# Patient Record
Sex: Male | Born: 1935 | Race: White | Hispanic: No | Marital: Married | Smoking: Former smoker
Health system: Southern US, Community
[De-identification: ages and names within clinical notes are randomized; demographics above are authoritative.]

## PROBLEM LIST (undated history)

## (undated) DIAGNOSIS — S065XAA Traumatic subdural hemorrhage with loss of consciousness status unknown, initial encounter: Secondary | ICD-10-CM

## (undated) DIAGNOSIS — I495 Sick sinus syndrome: Secondary | ICD-10-CM

## (undated) DIAGNOSIS — N400 Enlarged prostate without lower urinary tract symptoms: Secondary | ICD-10-CM

## (undated) DIAGNOSIS — J189 Pneumonia, unspecified organism: Secondary | ICD-10-CM

## (undated) DIAGNOSIS — S065X9A Traumatic subdural hemorrhage with loss of consciousness of unspecified duration, initial encounter: Secondary | ICD-10-CM

## (undated) DIAGNOSIS — Z950 Presence of cardiac pacemaker: Secondary | ICD-10-CM

## (undated) DIAGNOSIS — F0390 Unspecified dementia without behavioral disturbance: Secondary | ICD-10-CM

## (undated) HISTORY — PX: KNEE ARTHROSCOPY: SUR90

## (undated) HISTORY — PX: BURR HOLE: SHX908

## (undated) HISTORY — DX: Pneumonia, unspecified organism: J18.9

## (undated) HISTORY — DX: Sick sinus syndrome: I49.5

---

## 2006-04-12 ENCOUNTER — Inpatient Hospital Stay (HOSPITAL_COMMUNITY): Admission: RE | Admit: 2006-04-12 | Discharge: 2006-04-16 | Payer: Self-pay | Admitting: Neurosurgery

## 2006-04-18 ENCOUNTER — Encounter: Admission: RE | Admit: 2006-04-18 | Discharge: 2006-04-18 | Payer: Self-pay | Admitting: Neurosurgery

## 2006-04-25 ENCOUNTER — Encounter: Admission: RE | Admit: 2006-04-25 | Discharge: 2006-04-25 | Payer: Self-pay | Admitting: Neurosurgery

## 2006-05-17 ENCOUNTER — Encounter: Admission: RE | Admit: 2006-05-17 | Discharge: 2006-05-17 | Payer: Self-pay | Admitting: Neurosurgery

## 2012-01-01 ENCOUNTER — Emergency Department (HOSPITAL_COMMUNITY)
Admission: EM | Admit: 2012-01-01 | Discharge: 2012-01-01 | Disposition: A | Discharge status: Skilled Nursing Facility | Payer: Medicare Other | Attending: Emergency Medicine | Admitting: Emergency Medicine

## 2012-01-01 ENCOUNTER — Emergency Department (HOSPITAL_COMMUNITY): Payer: Medicare Other

## 2012-01-01 ENCOUNTER — Encounter (HOSPITAL_COMMUNITY): Payer: Self-pay | Admitting: Emergency Medicine

## 2012-01-01 DIAGNOSIS — W19XXXA Unspecified fall, initial encounter: Secondary | ICD-10-CM

## 2012-01-01 DIAGNOSIS — Y939 Activity, unspecified: Secondary | ICD-10-CM | POA: Insufficient documentation

## 2012-01-01 DIAGNOSIS — Z950 Presence of cardiac pacemaker: Secondary | ICD-10-CM | POA: Insufficient documentation

## 2012-01-01 DIAGNOSIS — N400 Enlarged prostate without lower urinary tract symptoms: Secondary | ICD-10-CM | POA: Insufficient documentation

## 2012-01-01 DIAGNOSIS — F0390 Unspecified dementia without behavioral disturbance: Secondary | ICD-10-CM | POA: Insufficient documentation

## 2012-01-01 DIAGNOSIS — S8990XA Unspecified injury of unspecified lower leg, initial encounter: Secondary | ICD-10-CM | POA: Insufficient documentation

## 2012-01-01 DIAGNOSIS — W010XXA Fall on same level from slipping, tripping and stumbling without subsequent striking against object, initial encounter: Secondary | ICD-10-CM | POA: Insufficient documentation

## 2012-01-01 DIAGNOSIS — Y929 Unspecified place or not applicable: Secondary | ICD-10-CM | POA: Insufficient documentation

## 2012-01-01 DIAGNOSIS — Z79899 Other long term (current) drug therapy: Secondary | ICD-10-CM | POA: Insufficient documentation

## 2012-01-01 HISTORY — DX: Unspecified dementia, unspecified severity, without behavioral disturbance, psychotic disturbance, mood disturbance, and anxiety: F03.90

## 2012-01-01 NOTE — ED Notes (Signed)
WUJ:WJ19<JY> Expected date:<BR> Expected time:<BR> Means of arrival:Ambulance<BR> Comments:<BR> fall

## 2012-01-01 NOTE — ED Notes (Signed)
Pt ambulated with steady gait to restroom #$ and back to pt's room.

## 2012-01-01 NOTE — ED Notes (Signed)
Per EMS pt came from nursing home. Pt was walking down carpeted hallway and thinks he got his feet tangled up under him and had un-witnessed fall. Per EMS pt was c/o of left leg pain. Pt c/o to me of left palm pain from catching himself and no other real pain at this time. Pt does have PMH of dementia/alzhiemers.

## 2012-01-01 NOTE — ED Notes (Addendum)
Pt's son requesting father be assessed for itching on posterior head, back and left rib cage area. And pt c/o of decreased movement in right ring finger. Dr. Clarene Duke made aware.

## 2012-01-01 NOTE — ED Notes (Signed)
Call son when pt is discharged:  Kylian Loh:  Work [phone]                            Cell [phone]

## 2012-01-01 NOTE — ED Provider Notes (Signed)
History     CSN: [id number]  Arrival date & time 01/01/12  4098   First MD Initiated Contact with Patient 01/01/12 [phone]      Chief Complaint  Patient presents with  . Leg Pain  . Fall     Patient is a 78 y.o. male presenting with leg pain and fall. The history is provided by the nursing home and the EMS personnel. History Limited By: Hx dementia.  Leg Pain   Fall  Pt was seen at 0820.  Per EMS, NH report and pt, pt with one episode of slip and fall PTA.  Pt states he "got my legs twisted up" and fell to his left side. Pt states he landed on his left shoulder, hand, hip, and knee. Also states he hit the left side of his head on the floor. Pt denies syncope and no reported syncope by NH staff.  Denies CP/SOB, no abd pain, no neck or back pain, no focal motor weakness, no tingling/numbness in extremities.     Past Medical History  Diagnosis Date  . Dementia     History reviewed. No pertinent past surgical history.   History  Substance Use Topics  . Smoking status: Never Smoker   . Smokeless tobacco: Not on file  . Alcohol Use: No      Review of Systems  Unable to perform ROS: Dementia    Allergies  Review of patient's allergies indicates no known allergies.  Home Medications   Current Outpatient Rx  Name Route Sig Dispense Refill  . FINASTERIDE 5 MG PO TABS Oral Take 5 mg by mouth daily.    Marland Kitchen LANSOPRAZOLE 30 MG PO CPDR Oral Take 30 mg by mouth daily.    Marland Kitchen MEMANTINE HCL 10 MG PO TABS Oral Take 10 mg by mouth 2 (two) times daily.    Marland Kitchen RIVASTIGMINE 9.5 MG/24HR TD PT24 Transdermal Place 1 patch onto the skin daily.    . SERTRALINE HCL 50 MG PO TABS Oral Take 50 mg by mouth daily.    Marland Kitchen SIMVASTATIN 40 MG PO TABS Oral Take 40 mg by mouth every evening.    Marland Kitchen TAMSULOSIN HCL 0.4 MG PO CAPS Oral Take 0.4 mg by mouth.    Marland Kitchen VITAMIN D (ERGOCALCIFEROL) 50000 UNITS PO CAPS Oral Take 50,000 Units by mouth every 7 (seven) days.      BP 122/72  Pulse 60  Temp 98.3 F (36.8  C) (Oral)  Resp 18  SpO2 96%  Physical Exam 0825: Physical examination: Vital signs and O2 SAT: Reviewed; Constitutional: Well developed, Well nourished, Well hydrated, In no acute distress; Head and Face: Normocephalic, Atraumatic. No scalp hematomas, contusions, abrasions.; Eyes: EOMI, PERRL, No scleral icterus; ENMT: Mouth and pharynx normal, Mucous membranes moist; Neck: Supple, Trachea midline; Spine: No midline CS, TS, LS tenderness.; Cardiovascular: Regular rate and rhythm, No gallop; Respiratory: Breath sounds clear & equal bilaterally, No wheezes, Normal respiratory effort/excursion; Chest: Nontender, No deformity, Movement normal, No crepitus, No abrasions or ecchymosis.; Abdomen: Soft, Nontender, Nondistended, Normal bowel sounds, No abrasions or ecchymosis.;; Extremities: No deformity, Full range of motion major/large joints of bilat UE's and LE's without pain or tenderness to palp, Neurovascularly intact, Pulses normal, No tenderness, No edema, No ecchymosis. Pelvis stable; Neuro: Awake, alert, mildly confused re: events, Major CN grossly intact. Speech clear. Moves all extremities well without apparent gross focal motor deficits.; Skin: Color normal, Warm, Dry  ED Course  Procedures   MDM  MDM Reviewed: nursing note, vitals  and previous chart Interpretation: x-ray and CT scan     Dg Hip Complete Left 01/01/2012  *RADIOLOGY REPORT*  Clinical Data: Fall  LEFT HIP - COMPLETE 2+ VIEW  Comparison: None.  Findings: Four views of the left hip submitted.  No acute fracture or subluxation.  No radiopaque foreign body.  Pelvic phleboliths are noted.  Mild generative changes lower lumbar spine.  IMPRESSION: No acute fracture or subluxation.   Original Report Authenticated By: Natasha Mead, M.D.    Ct Head Wo Contrast 01/01/2012  *RADIOLOGY REPORT*  Clinical Data: Fall  CT HEAD WITHOUT CONTRAST  Technique:  Contiguous axial images were obtained from the base of the skull through the vertex  without contrast.  Comparison: 05/17/2006  Findings: No skull fracture is noted.  Again noted burr holes in the left frontal and parietal skull.  No intracranial hemorrhage, mass effect or midline shift.  Stable mild cerebral atrophy.  Mild periventricular white matter decreased attenuation probable due to chronic small vessel ischemic changes.  No acute infarction.  No mass lesion is noted on this unenhanced scan.  IMPRESSION: No acute intracranial abnormality.  Stable atrophy.  Mild periventricular white matter decreased attenuation probable due to chronic small vessel ischemic changes.   Original Report Authenticated By: Natasha Mead, M.D.    Dg Shoulder Left 01/01/2012  *RADIOLOGY REPORT*  Clinical Data: Fall, shoulder pain  LEFT SHOULDER - 2+ VIEW  Comparison: None  Findings: Three views of the left shoulder submitted.  No acute fracture or subluxation.  There is high-riding humeral head suspicious for chronic rotator cuff insufficiency. Mild degenerative changes left AC joint.  IMPRESSION: No acute fracture or subluxation.  High riding humeral head suspicious for chronic rotator cuff insufficiency.   Original Report Authenticated By: Natasha Mead, M.D.    Dg Knee Complete 4 Views Left 01/01/2012  *RADIOLOGY REPORT*  Clinical Data: Fall, knee pain  LEFT KNEE - COMPLETE 4+ VIEW  Comparison: None.  Findings: Four views of the left knee submitted.  No acute fracture or subluxation.  Well corticated bony fragment adjacent to lateral tibial plateau is most likely due to prior injury.  Mild narrowing of medial joint compartment.  Mild spurring of medial tibial plateau.  Minimal spurring of the medial femoral condyle.  Mild narrowing of patellofemoral joint space.  No joint effusion.  IMPRESSION: No acute fracture or subluxation.  Mild degenerative changes as described above.   Original Report Authenticated By: Natasha Mead, M.D.    Dg Hand Complete Left 01/01/2012  *RADIOLOGY REPORT*  Clinical Data: History of injury  from fall.  History of pain.  LEFT HAND - COMPLETE 3+ VIEW  Comparison: None.  Findings: No acute fracture is evident.  No dislocation is seen. 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  IMPRESSION: No evidence of acute fracture.  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   Original Report Authenticated By: Crawford Givens, M.D.    Dg Finger Ring Right 01/01/2012  *RADIOLOGY REPORT*  Clinical Data: Pain  RIGHT RING FINGER 2+V  Comparison: None.  Findings: Three views of the right fourth finger submitted.  No acute fracture or subluxation.  No radiopaque foreign body.  IMPRESSION: No acute fracture or subluxation.   Original Report Authenticated By: Natasha Mead, M.D.  1245:  Pt has ambulated around the ED with steady gait, easy resps, NAD.  Pt and son would like to have pt go back to the NH now.  Dx and testing d/w pt and family.  Questions answered.  Verb understanding, agreeable to d/c home with outpt f/u.       Laray Anger, DO 01/02/12 1930

## 2012-01-02 ENCOUNTER — Encounter (HOSPITAL_COMMUNITY): Payer: Self-pay | Admitting: Emergency Medicine

## 2012-01-02 ENCOUNTER — Emergency Department (HOSPITAL_COMMUNITY)
Admission: EM | Admit: 2012-01-02 | Discharge: 2012-01-02 | Disposition: A | Discharge status: Home / Self Care | Payer: Medicare Other | Attending: Emergency Medicine | Admitting: Emergency Medicine

## 2012-01-02 DIAGNOSIS — R531 Weakness: Secondary | ICD-10-CM

## 2012-01-02 DIAGNOSIS — N400 Enlarged prostate without lower urinary tract symptoms: Secondary | ICD-10-CM | POA: Insufficient documentation

## 2012-01-02 DIAGNOSIS — F0390 Unspecified dementia without behavioral disturbance: Secondary | ICD-10-CM | POA: Insufficient documentation

## 2012-01-02 DIAGNOSIS — Z79899 Other long term (current) drug therapy: Secondary | ICD-10-CM | POA: Insufficient documentation

## 2012-01-02 DIAGNOSIS — R5381 Other malaise: Secondary | ICD-10-CM | POA: Insufficient documentation

## 2012-01-02 DIAGNOSIS — Z950 Presence of cardiac pacemaker: Secondary | ICD-10-CM | POA: Insufficient documentation

## 2012-01-02 HISTORY — DX: Benign prostatic hyperplasia without lower urinary tract symptoms: N40.0

## 2012-01-02 HISTORY — DX: Traumatic subdural hemorrhage with loss of consciousness status unknown, initial encounter: S06.5XAA

## 2012-01-02 HISTORY — DX: Presence of cardiac pacemaker: Z95.0

## 2012-01-02 HISTORY — DX: Traumatic subdural hemorrhage with loss of consciousness of unspecified duration, initial encounter: S06.5X9A

## 2012-01-02 NOTE — Progress Notes (Signed)
Prior to pt leaving [facility] ED CM confirmed with pt grand daughter that Charles Lambert is pcp EPIC updated

## 2012-01-02 NOTE — ED Provider Notes (Signed)
History     CSN: [id number]  Arrival date & time 01/02/12  1151   First MD Initiated Contact with Patient 01/02/12 1337      Chief Complaint  Patient presents with  . Hypotension    (Consider location/radiation/quality/duration/timing/severity/associated sxs/prior treatment) HPI Comments: Charles Lambert presents for evaluation from a local nursing home.  He has a history of dementia and was seen in the ER yesterday after falling while walking.  This event was described as a mechanical fall.  Today while preparing for physical rehab, he was noted to have a low blood pressure with a systolic in the 70s.  A second provider obtained his BP and also noted it to be laow.  There was some concern that his mental status was changed from his baseline also.  There has been no reported fever, decreased appetite, trauma today, NVD, SOB, CP, rashes, or there concerns.  Charles Lambert states he feels fine.  The history is provided by the patient, the nursing home and a relative. The history is limited by the condition of the patient (he has advanced dementia).    Past Medical History  Diagnosis Date  . Dementia   . Subdural hematoma   . Prostate enlargement   . Pacemaker     Past Surgical History  Procedure Date  . Burr hole     No family history on file.  History  Substance Use Topics  . Smoking status: Never Smoker   . Smokeless tobacco: Not on file  . Alcohol Use: No      Review of Systems  Unable to perform ROS   Allergies  Review of patient's allergies indicates no known allergies.  Home Medications   Current Outpatient Rx  Name Route Sig Dispense Refill  . FINASTERIDE 5 MG PO TABS Oral Take 5 mg by mouth daily.    Marland Kitchen LANSOPRAZOLE 30 MG PO CPDR Oral Take 30 mg by mouth daily.    Marland Kitchen MEMANTINE HCL 10 MG PO TABS Oral Take 10 mg by mouth 2 (two) times daily.    Marland Kitchen RIVASTIGMINE 9.5 MG/24HR TD PT24 Transdermal Place 1 patch onto the skin daily.    . SERTRALINE HCL 50 MG PO TABS  Oral Take 50 mg by mouth daily.    Marland Kitchen SIMVASTATIN 40 MG PO TABS Oral Take 40 mg by mouth every evening.    Marland Kitchen TAMSULOSIN HCL 0.4 MG PO CAPS Oral Take 0.4 mg by mouth.    Marland Kitchen VITAMIN D (ERGOCALCIFEROL) 50000 UNITS PO CAPS Oral Take 50,000 Units by mouth every 7 (seven) days.      BP 126/71  Pulse 60  Temp 98 F (36.7 C) (Oral)  Resp 20  Ht 5\' 10"  (1.778 m)  Wt 195 lb (88.451 kg)  BMI 27.98 kg/m2  SpO2 96%  Physical Exam  Nursing note and vitals reviewed. Constitutional: He appears well-developed and well-nourished. No distress.       Pt is sleep, easily aroused and pleasant.  HENT:  Head: Normocephalic and atraumatic.  Right Ear: External ear normal.  Left Ear: External ear normal.  Nose: Nose normal.  Mouth/Throat: Oropharynx is clear and moist. No oropharyngeal exudate.  Eyes: Conjunctivae normal and EOM are normal. Pupils are equal, round, and reactive to light. Right eye exhibits no discharge. Left eye exhibits no discharge. No scleral icterus.  Neck: Normal range of motion. Neck supple. No JVD present. No tracheal deviation present.  Cardiovascular: Normal rate, regular rhythm and intact distal pulses.  Exam reveals  no gallop and no friction rub.   Murmur heard. Pulmonary/Chest: Breath sounds normal. No stridor. No respiratory distress. He has no wheezes. He has no rales. He exhibits no tenderness.  Abdominal: Soft. Bowel sounds are normal. He exhibits no distension and no mass. There is no tenderness. There is no rebound and no guarding.  Musculoskeletal: Normal range of motion. He exhibits edema (trace pretibial). He exhibits no tenderness.  Lymphadenopathy:    He has no cervical adenopathy.  Neurological: He is alert. No cranial nerve deficit.       Oriented to his name, the medical setting, and his bedside granddaughter.  Unable to explain why he is in the ER   Skin: Skin is warm and dry. No rash noted. He is not diaphoretic. No erythema. No pallor.  Psychiatric: He has a  normal mood and affect. His behavior is normal.    ED Course  Procedures (including critical care time)  Labs Reviewed - No data to display Dg Hip Complete Left  01/01/2012  *RADIOLOGY REPORT*  Clinical Data: Fall  LEFT HIP - COMPLETE 2+ VIEW  Comparison: None.  Findings: Four views of the left hip submitted.  No acute fracture or subluxation.  No radiopaque foreign body.  Pelvic phleboliths are noted.  Mild generative changes lower lumbar spine.  IMPRESSION: No acute fracture or subluxation.   Original Report Authenticated By: Natasha Mead, M.D.    Ct Head Wo Contrast  01/01/2012  *RADIOLOGY REPORT*  Clinical Data: Fall  CT HEAD WITHOUT CONTRAST  Technique:  Contiguous axial images were obtained from the base of the skull through the vertex without contrast.  Comparison: 05/17/2006  Findings: No skull fracture is noted.  Again noted burr holes in the left frontal and parietal skull.  No intracranial hemorrhage, mass effect or midline shift.  Stable mild cerebral atrophy.  Mild periventricular white matter decreased attenuation probable due to chronic small vessel ischemic changes.  No acute infarction.  No mass lesion is noted on this unenhanced scan.  IMPRESSION: No acute intracranial abnormality.  Stable atrophy.  Mild periventricular white matter decreased attenuation probable due to chronic small vessel ischemic changes.   Original Report Authenticated By: Natasha Mead, M.D.    Dg Shoulder Left  01/01/2012  *RADIOLOGY REPORT*  Clinical Data: Fall, shoulder pain  LEFT SHOULDER - 2+ VIEW  Comparison: None  Findings: Three views of the left shoulder submitted.  No acute fracture or subluxation.  There is high-riding humeral head suspicious for chronic rotator cuff insufficiency. Mild degenerative changes left AC joint.  IMPRESSION: No acute fracture or subluxation.  High riding humeral head suspicious for chronic rotator cuff insufficiency.   Original Report Authenticated By: Natasha Mead, M.D.    Dg  Knee Complete 4 Views Left  01/01/2012  *RADIOLOGY REPORT*  Clinical Data: Fall, knee pain  LEFT KNEE - COMPLETE 4+ VIEW  Comparison: None.  Findings: Four views of the left knee submitted.  No acute fracture or subluxation.  Well corticated bony fragment adjacent to lateral tibial plateau is most likely due to prior injury.  Mild narrowing of medial joint compartment.  Mild spurring of medial tibial plateau.  Minimal spurring of the medial femoral condyle.  Mild narrowing of patellofemoral joint space.  No joint effusion.  IMPRESSION: No acute fracture or subluxation.  Mild degenerative changes as described above.   Original Report Authenticated By: Natasha Mead, M.D.    Dg Hand Complete Left  01/01/2012  *RADIOLOGY REPORT*  Clinical Data: History of  injury from fall.  History of pain.  LEFT HAND - COMPLETE 3+ VIEW  Comparison: None.  Findings: No acute fracture is evident.  No dislocation is seen. 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  IMPRESSION: No evidence of acute fracture.  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   Original Report Authenticated By: Crawford Givens, M.D.    Dg Finger Ring Right  01/01/2012  *RADIOLOGY REPORT*  Clinical Data: Pain  RIGHT RING FINGER 2+V  Comparison: None.  Findings: Three views of the right fourth finger submitted.  No acute fracture or subluxation.  No radiopaque foreign body.  IMPRESSION: No acute fracture or subluxation.   Original Report Authenticated By: Natasha Mead, M.D.      No diagnosis found.   Date: 01/02/2012  Rate: 60 bpm  Rhythm: junctional rhythm  QRS Axis: normal  Intervals: normal (absent PR int)  ST/T Wave abnormalities: nonspecific ST changes  Conduction  Disutrbances:none  Narrative Interpretation:   Old EKG Reviewed: last study was sinus brady      MDM  Pt presents for evaluation of hypotension.  He has a hx of dementia and was seen in the ER yesterday after a mechanical fall.  His granddaughter is at the bedside and states his mental status is at his baseline.  He appears comfortable, NAD.  Note no hypotension on his initial vital signs.  Will repeat vital signs and obtain orthostatic vital signs also.  Will check a rect al temperature and challenge him with a lunch tray.  If abnormalities are noted, will broaden his evaluation.  1620.  Pt stable, NAD.  He is awake and oriented at his baseline.  Not negative orthostatic vital signs and no neurologic instability.  He has been observed for 4.5 hours here in the ER.  Discussed his evaluation with Chrissy the practiciooner from his nursing facility.  Plan discharge home.      Tobin Chad, MD 01/02/12 [phone]

## 2012-01-02 NOTE — ED Notes (Signed)
Pt from [facility] was seen here yesterday for a fall, and now brought in by the request of the resident PA who wanted him evaluated for a low blood pressure this AM which she felt may have contributed to his fall yesterday.

## 2012-01-10 ENCOUNTER — Encounter: Payer: Self-pay | Admitting: *Deleted

## 2012-01-10 ENCOUNTER — Encounter: Payer: Self-pay | Admitting: Internal Medicine

## 2012-01-10 ENCOUNTER — Ambulatory Visit (INDEPENDENT_AMBULATORY_CARE_PROVIDER_SITE_OTHER): Payer: Medicare Other | Admitting: Internal Medicine

## 2012-01-10 VITALS — BP 126/64 | HR 61 | Ht 71.0 in | Wt 207.0 lb

## 2012-01-10 DIAGNOSIS — Z950 Presence of cardiac pacemaker: Secondary | ICD-10-CM

## 2012-01-10 DIAGNOSIS — I495 Sick sinus syndrome: Secondary | ICD-10-CM

## 2012-01-10 DIAGNOSIS — I4891 Unspecified atrial fibrillation: Secondary | ICD-10-CM

## 2012-01-10 LAB — PACEMAKER DEVICE OBSERVATION
AL AMPLITUDE: 1.3 mv
AL THRESHOLD: 1 V
ATRIAL PACING PM: 97
BATTERY VOLTAGE: 2.9629 V
DEVICE MODEL PM: 7135038
RV LEAD IMPEDENCE PM: 437.5 Ohm
RV LEAD THRESHOLD: 1 V

## 2012-01-10 NOTE — Progress Notes (Signed)
HPI [name] is referred today for ongoing evaluation and management of his PPM. The patient is originally from [place] and moved [facility] several years ago. He and his wife have subsequently moved to [place] to be closer to family. The patient has a history of dementia which is progressive. He also is a history of bradycardia and atrial fibrillation and tachybradycardia syndrome. He underwent permanent pacemaker insertion in 2011. He denies chest pain or shortness of breath. He struggles with problems with memory. He is been fairly sedentary. No Known Allergies   Current Outpatient Prescriptions  Medication Sig Dispense Refill  . finasteride (PROSCAR) 5 MG tablet Take 5 mg by mouth daily.      . lansoprazole (PREVACID) 30 MG capsule Take 30 mg by mouth daily.      . memantine (NAMENDA) 10 MG tablet Take 10 mg by mouth 2 (two) times daily.      . rivastigmine (EXELON) 9.5 mg/24hr Place 1 patch onto the skin daily.      . sertraline (ZOLOFT) 50 MG tablet Take 50 mg by mouth daily.      . simvastatin (ZOCOR) 40 MG tablet Take 40 mg by mouth every evening.      . Tamsulosin HCl (FLOMAX) 0.4 MG CAPS Take 0.4 mg by mouth.      . traMADol (ULTRAM) 50 MG tablet Take 50 mg by mouth every 6 (six) hours as needed.      . Vitamin D, Ergocalciferol, (DRISDOL) 50000 UNITS CAPS Take 50,000 Units by mouth every 7 (seven) days.         Past Medical History  Diagnosis Date  . Dementia   . Subdural hematoma   . Prostate enlargement   . Pacemaker   . Sinoatrial node dysfunction     ROS:   All systems reviewed and negative except as noted in the HPI.   Past Surgical History  Procedure Date  . Burr hole      No family history on file.   History   Social History  . Marital Status: Married    Spouse Name: N/A    Number of Children: N/A  . Years of Education: N/A   Occupational History  . Not on file.   Social History Main Topics  . Smoking status: Never Smoker     . Smokeless tobacco: Not on file  . Alcohol Use: No  . Drug Use: No  . Sexually Active:    Other Topics Concern  . Not on file   Social History Narrative  . No narrative on file     BP 126/64  Pulse 61  Ht 5\' 11"  (1.803 m)  Wt 207 lb (93.895 kg)  BMI 28.87 kg/m2  SpO2 97%  Physical Exam:  Well appearing elderly man, NAD HEENT: Unremarkable Neck:  No JVD, no thyromegally Lungs:  Clear with no wheezes, rales, or rhonchi. Well-healed pacemaker incision. HEART:  Regular rate rhythm, no murmurs, no rubs, no clicks Abd:  soft, positive bowel sounds, no organomegally, no rebound, no guarding Ext:  2 plus pulses, no edema, no cyanosis, no clubbing Skin:  No rashes no nodules Neuro:  CN II through XII intact, motor grossly intact   DEVICE  Normal device function.  See PaceArt for details.   Assess/Plan:

## 2012-01-10 NOTE — Assessment & Plan Note (Signed)
He underwent cardioversion in June. Since then he appears to be maintaining sinus rhythm very nicely. Because of his history of dementia and falls, he has not felt to be an anticoagulation candidate.

## 2012-01-10 NOTE — Patient Instructions (Addendum)
Your physician wants you to follow-up in: 6 months with Dr Court Joy will receive a reminder letter in the mail two months in advance. If you don't receive a letter, please call our office to schedule the follow-up appointment.   Remote monitoring is used to monitor your Pacemaker of ICD from home. This monitoring reduces the number of office visits required to check your device to one time per year. It allows [name] to keep an eye on the functioning of your device to ensure it is working properly. You are scheduled for a device check from home on 04/17/12. You may send your transmission at any time that day. If you have a wireless device, the transmission will be sent automatically. After your physician reviews your transmission, you will receive a postcard with your next transmission date.

## 2012-01-10 NOTE — Assessment & Plan Note (Signed)
His dual-chamber pacemaker is working normally. We'll plan to recheck in several months.

## 2012-04-14 ENCOUNTER — Encounter: Payer: Medicare Other | Admitting: *Deleted

## 2012-04-16 ENCOUNTER — Encounter: Payer: Self-pay | Admitting: *Deleted

## 2012-06-16 ENCOUNTER — Emergency Department (HOSPITAL_COMMUNITY): Payer: Medicare Other

## 2012-06-16 ENCOUNTER — Encounter (HOSPITAL_COMMUNITY): Payer: Self-pay | Admitting: *Deleted

## 2012-06-16 ENCOUNTER — Emergency Department (HOSPITAL_COMMUNITY)
Admission: EM | Admit: 2012-06-16 | Discharge: 2012-06-16 | Disposition: A | Discharge status: Home / Self Care | Payer: Medicare Other | Attending: Emergency Medicine | Admitting: Emergency Medicine

## 2012-06-16 ENCOUNTER — Other Ambulatory Visit: Payer: Self-pay

## 2012-06-16 DIAGNOSIS — Z8679 Personal history of other diseases of the circulatory system: Secondary | ICD-10-CM | POA: Insufficient documentation

## 2012-06-16 DIAGNOSIS — F0390 Unspecified dementia without behavioral disturbance: Secondary | ICD-10-CM | POA: Insufficient documentation

## 2012-06-16 DIAGNOSIS — Z79899 Other long term (current) drug therapy: Secondary | ICD-10-CM | POA: Insufficient documentation

## 2012-06-16 DIAGNOSIS — R4182 Altered mental status, unspecified: Secondary | ICD-10-CM | POA: Insufficient documentation

## 2012-06-16 DIAGNOSIS — Z950 Presence of cardiac pacemaker: Secondary | ICD-10-CM | POA: Insufficient documentation

## 2012-06-16 DIAGNOSIS — R5381 Other malaise: Secondary | ICD-10-CM | POA: Insufficient documentation

## 2012-06-16 LAB — COMPREHENSIVE METABOLIC PANEL
Albumin: 3.6 g/dL (ref 3.5–5.2)
Alkaline Phosphatase: 56 U/L (ref 39–117)
BUN: 16 mg/dL (ref 6–23)
Chloride: 105 mEq/L (ref 96–112)
Creatinine, Ser: 1.29 mg/dL (ref 0.50–1.35)
GFR calc Af Amer: 58 mL/min — ABNORMAL LOW (ref >90)
GFR calc non Af Amer: 50 mL/min — ABNORMAL LOW (ref >90)
Glucose, Bld: 97 mg/dL (ref 70–99)
Potassium: 4.1 mEq/L (ref 3.5–5.1)
Total Bilirubin: 0.6 mg/dL (ref 0.3–1.2)

## 2012-06-16 LAB — CBC WITH DIFFERENTIAL/PLATELET
Basophils Relative: 0 % (ref 0–1)
HCT: 41.2 % (ref 39.0–52.0)
Hemoglobin: 13.9 g/dL (ref 13.0–17.0)
Lymphs Abs: 1 10*3/uL (ref 0.7–4.0)
MCH: 30.7 pg (ref 26.0–34.0)
MCHC: 33.7 g/dL (ref 30.0–36.0)
Monocytes Absolute: 0.4 10*3/uL (ref 0.1–1.0)
Monocytes Relative: 7 % (ref 3–12)
Neutro Abs: 3.7 10*3/uL (ref 1.7–7.7)
RBC: 4.53 MIL/uL (ref 4.22–5.81)

## 2012-06-16 LAB — URINALYSIS, ROUTINE W REFLEX MICROSCOPIC
Glucose, UA: NEGATIVE mg/dL
Ketones, ur: NEGATIVE mg/dL
Nitrite: NEGATIVE
Protein, ur: NEGATIVE mg/dL
Urobilinogen, UA: 2 mg/dL — ABNORMAL HIGH (ref 0.0–1.0)

## 2012-06-16 LAB — URINE MICROSCOPIC-ADD ON

## 2012-06-16 NOTE — ED Provider Notes (Signed)
History     CSN: [id number]  Arrival date & time 06/16/12  1319   First MD Initiated Contact with Patient 06/16/12 1324      Chief Complaint  Patient presents with  . Altered Mental Status    (Consider location/radiation/quality/duration/timing/severity/associated sxs/prior treatment) Patient is a 79 y.o. male presenting with altered mental status. The history is provided by the nursing home and a relative. The history is limited by the condition of the patient (dementia).  Altered Mental Status  He was sent from nursing home because of altered mental status which is noted that in today. Family states that he had been weak and lethargic several days ago and urinalysis have been sent him for suspected urinary tract infection. Culture supposed to be available today. He had been more alert and more active yesterday. RN noted that he was completely unresponsive when she went in to evaluate him but several minutes later he was talking and responding normally. Patient is complaining of a vague headache. He does have a history of holes to evacuate subdural hematoma. Family is present now and states that he is pretty much at his baseline mental status although somewhat more subdued than normal.  Past Medical History  Diagnosis Date  . Dementia   . Subdural hematoma   . Prostate enlargement   . Pacemaker   . Sinoatrial node dysfunction     Past Surgical History  Procedure Laterality Date  . Burr hole      History reviewed. No pertinent family history.  History  Substance Use Topics  . Smoking status: Never Smoker   . Smokeless tobacco: Not on file  . Alcohol Use: No      Review of Systems  Unable to perform ROS: Dementia  Psychiatric/Behavioral: Positive for altered mental status.    Allergies  Review of patient's allergies indicates no known allergies.  Home Medications   Current Outpatient Rx  Name  Route  Sig  Dispense  Refill  . cholecalciferol (VITAMIN D) 1000  UNITS tablet   Oral   Take 1,000 Units by mouth daily.         . finasteride (PROSCAR) 5 MG tablet   Oral   Take 5 mg by mouth daily.         . lansoprazole (PREVACID) 30 MG capsule   Oral   Take 30 mg by mouth daily.         . memantine (NAMENDA) 10 MG tablet   Oral   Take 10 mg by mouth 2 (two) times daily.         Marland Kitchen nystatin cream (MYCOSTATIN)   Topical   Apply 1 application topically 2 (two) times daily.         Marland Kitchen omega-3 acid ethyl esters (LOVAZA) 1 G capsule   Oral   Take 1 g by mouth daily.         . rivastigmine (EXELON) 9.5 mg/24hr   Transdermal   Place 1 patch onto the skin daily.         . sertraline (ZOLOFT) 50 MG tablet   Oral   Take 50 mg by mouth daily.         . simvastatin (ZOCOR) 40 MG tablet   Oral   Take 40 mg by mouth every evening.         . Tamsulosin HCl (FLOMAX) 0.4 MG CAPS   Oral   Take 0.4 mg by mouth daily after supper.          Marland Kitchen  traMADol (ULTRAM) 50 MG tablet   Oral   Take 50 mg by mouth every 8 (eight) hours as needed for pain.          . traZODone (DESYREL) 50 MG tablet   Oral   Take 50 mg by mouth at bedtime.         . vitamin B-12 (CYANOCOBALAMIN) 1000 MCG tablet   Oral   Take 1,000 mcg by mouth daily.           BP 150/103  Pulse 113  Temp(Src) 98.2 F (36.8 C) (Oral)  Resp 18  SpO2 96%  Physical Exam  Nursing note and vitals reviewed.  79 year old male, resting comfortably and in no acute distress. Vital signs are significant for tachycardia with heart rate of 113, and hypertension with blood pressure 150/103. Oxygen saturation is 96%, which is normal. Head is normocephalic with bur holes palpable. PERRLA, EOMI. Oropharynx is clear. Fundi show no hemorrhage, exudate, or papilledema. Neck is nontender and supple without adenopathy or JVD. Back is nontender and there is no CVA tenderness. Lungs are clear without rales, wheezes, or rhonchi. Chest is nontender. Heart has regular rate and  rhythm without murmur. Abdomen is soft, flat, nontender without masses or hepatosplenomegaly and peristalsis is normoactive. Extremities have trace edema, full range of motion is present. Skin is warm and dry without rash. Neurologic: He is awake, alert, oriented to person and place but not time. Speech is slightly slow but appropriate. Cranial nerves are grossly intact. Increased muscle and is noted with slight waxy rigidity. Clonus is present in lower extremities more prominent at the knees and the ankle. Ankle has 2 beat clonus and he has virtually continuous clonus.  ED Course  Procedures (including critical care time)  Results for orders placed during the hospital encounter of 06/16/12  CBC WITH DIFFERENTIAL      Result Value Range   WBC 5.2  4.0 - 10.5 K/uL   RBC 4.53  4.22 - 5.81 MIL/uL   Hemoglobin 13.9  13.0 - 17.0 g/dL   HCT 32.4  40.1 - 02.7 %   MCV 90.9  78.0 - 100.0 fL   MCH 30.7  26.0 - 34.0 pg   MCHC 33.7  30.0 - 36.0 g/dL   RDW 25.3  66.4 - 40.3 %   Platelets 195  150 - 400 K/uL   Neutrophils Relative 72  43 - 77 %   Neutro Abs 3.7  1.7 - 7.7 K/uL   Lymphocytes Relative 19  12 - 46 %   Lymphs Abs 1.0  0.7 - 4.0 K/uL   Monocytes Relative 7  3 - 12 %   Monocytes Absolute 0.4  0.1 - 1.0 K/uL   Eosinophils Relative 2  0 - 5 %   Eosinophils Absolute 0.1  0.0 - 0.7 K/uL   Basophils Relative 0  0 - 1 %   Basophils Absolute 0.0  0.0 - 0.1 K/uL  COMPREHENSIVE METABOLIC PANEL      Result Value Range   Sodium 142  135 - 145 mEq/L   Potassium 4.1  3.5 - 5.1 mEq/L   Chloride 105  96 - 112 mEq/L   CO2 29  19 - 32 mEq/L   Glucose, Bld 97  70 - 99 mg/dL   BUN 16  6 - 23 mg/dL   Creatinine, Ser 4.74  0.50 - 1.35 mg/dL   Calcium 9.4  8.4 - 25.9 mg/dL   Total Protein 6.6  6.0 -  8.3 g/dL   Albumin 3.6  3.5 - 5.2 g/dL   AST 22  0 - 37 U/L   ALT 13  0 - 53 U/L   Alkaline Phosphatase 56  39 - 117 U/L   Total Bilirubin 0.6  0.3 - 1.2 mg/dL   GFR calc non Af Amer 50 (*) >90  mL/min   GFR calc Af Amer 58 (*) >90 mL/min  URINALYSIS, ROUTINE W REFLEX MICROSCOPIC      Result Value Range   Color, Urine YELLOW  YELLOW   APPearance CLEAR  CLEAR   Specific Gravity, Urine 1.015  1.005 - 1.030   pH 7.0  5.0 - 8.0   Glucose, UA NEGATIVE  NEGATIVE mg/dL   Hgb urine dipstick NEGATIVE  NEGATIVE   Bilirubin Urine NEGATIVE  NEGATIVE   Ketones, ur NEGATIVE  NEGATIVE mg/dL   Protein, ur NEGATIVE  NEGATIVE mg/dL   Urobilinogen, UA 2.0 (*) 0.0 - 1.0 mg/dL   Nitrite NEGATIVE  NEGATIVE   Leukocytes, UA TRACE (*) NEGATIVE  URINE MICROSCOPIC-ADD ON      Result Value Range   Squamous Epithelial / LPF RARE  RARE   WBC, UA 0-2  <3 WBC/hpf   RBC / HPF 3-6  <3 RBC/hpf   Bacteria, UA RARE  RARE   Dg Chest 2 View  06/16/2012  *RADIOLOGY REPORT*  Clinical Data: Altered mental status.  Chest pain.  CHEST - 2 VIEW  Comparison: 04/12/2006  Findings: Patient has left-sided transvenous pacemaker, leads to the right atrium and right ventricle.  The heart is mildly enlarged.  There are perihilar bronchitic changes.  No focal consolidations or pleural effusions are identified.  Mild lower thoracic degenerative changes are identified.  Nodule previously questioned in the left upper lobe is not seen on current study but may be obscured by the pacemaker.  IMPRESSION:  1.  Cardiomegaly without pulmonary edema. 2.  Bronchitic changes. 3. No focal pulmonary abnormality.   Original Report Authenticated By: Norva Pavlov, M.D.    Ct Head Wo Contrast  06/16/2012  *RADIOLOGY REPORT*  Clinical Data: Altered mental status.  Nonverbal.  CT HEAD WITHOUT CONTRAST  Technique:  Contiguous axial images were obtained from the base of the skull through the vertex without contrast.  Comparison: 01/01/2012  Findings: Bone windows demonstrate prior left frontal craniotomy. Clear paranasal sinuses and mastoid air cells.  Soft tissue windows demonstrate mild cerebral and cerebellar atrophy. No  mass lesion, hemorrhage,  hydrocephalus, acute infarct, intra-axial, or extra-axial fluid collection.  Mild low density in the periventricular white matter likely related to small vessel disease.  IMPRESSION:  1. No acute intracranial abnormality. 2. Cerebral atrophy and small vessel ischemic change.   Original Report Authenticated By: Jeronimo Greaves, M.D.       Date: 06/16/2012  Rate: 114  Rhythm: sinus tachycardia  QRS Axis: left  Intervals: QT prolonged  ST/T Wave abnormalities: normal  Conduction Disutrbances:left bundle branch block  Narrative Interpretation: Sinus tachycardia with left bundle branch block and prolonged QT interval. Compared with ECG of 01/02/2012, heart rate is increased, left bundle branch block is now present.  Old EKG Reviewed: changes noted    1. Altered mental status       MDM  Altered mental status which is transient and resolving. Given his clonus, I'm suspicious that he is having some type of seizure causing his altered mentation. Workup has been initiated.  Records from his assisted living facility have been obtained and a urine culture from  2 days ago did not have any growth. Head CT and chest x-ray are unremarkable.  Workup in the ED is unremarkable. Have discussed possibility of a seizure with patient and the family. He has not had any further episodes of altered mentation in the ED so I do not feel he needs any immediate intervention. I recommended EEG and possible neurology consultation as an outpatient.  Dione Booze, MD 06/16/12 [phone]

## 2012-06-16 NOTE — ED Notes (Signed)
Pt from [facility].  Staff report that he became altered at noon today.  Pt not responding to commands or questions.  Pt non-verbal at this time.  Norm-pt talkative, friendly, ambulatory.  PERRLA.  140/90, 84, CBG 98.

## 2012-07-15 ENCOUNTER — Encounter: Payer: Self-pay | Admitting: Internal Medicine

## 2012-07-15 ENCOUNTER — Ambulatory Visit (INDEPENDENT_AMBULATORY_CARE_PROVIDER_SITE_OTHER): Payer: Medicare Other | Admitting: Internal Medicine

## 2012-07-15 VITALS — BP 142/108 | HR 114 | Ht 70.0 in | Wt 207.0 lb

## 2012-07-15 DIAGNOSIS — I495 Sick sinus syndrome: Secondary | ICD-10-CM

## 2012-07-15 DIAGNOSIS — Z950 Presence of cardiac pacemaker: Secondary | ICD-10-CM

## 2012-07-15 DIAGNOSIS — I4891 Unspecified atrial fibrillation: Secondary | ICD-10-CM

## 2012-07-15 NOTE — Assessment & Plan Note (Signed)
His [employer] dual-chamber pacemaker is working normally. We'll plan to recheck in several months. 

## 2012-07-15 NOTE — Assessment & Plan Note (Signed)
He is out of rhythm today. He is been out of rhythm for several weeks. Despite this his symptoms have been quiet.

## 2012-07-15 NOTE — Patient Instructions (Addendum)
Your physician wants you to follow-up in: 1 year with Dr Ladona Ridgel. You will receive a reminder letter in the mail two months in advance. If you don't receive a letter, please call our office to schedule the follow-up appointment.  Patient needs to walk 5-10 minutes after each meal per Dr Ladona Ridgel.

## 2012-07-15 NOTE — Progress Notes (Signed)
HPI [name] returns today for followup. He is a very pleasant 79 year old man with a history of cigarette bradycardia, subdural hematoma, hypertension, status post permanent pacemaker insertion. He also has atrial fibrillation/flutter. He is not a candidate for anti-coagulation because of his remote history of bleeding. In the interim, he has been stable. He denies chest pain or shortness of breath. He is mild peripheral edema.  No Known Allergies   Current Outpatient Prescriptions  Medication Sig Dispense Refill  . cholecalciferol (VITAMIN D) 1000 UNITS tablet Take 1,000 Units by mouth daily.      . finasteride (PROSCAR) 5 MG tablet Take 5 mg by mouth daily.      . lansoprazole (PREVACID) 30 MG capsule Take 30 mg by mouth daily.      . memantine (NAMENDA) 10 MG tablet Take 10 mg by mouth 2 (two) times daily.      Marland Kitchen nystatin cream (MYCOSTATIN) Apply 1 application topically 2 (two) times daily.      Marland Kitchen omega-3 acid ethyl esters (LOVAZA) 1 G capsule Take 1 g by mouth daily.      . rivastigmine (EXELON) 9.5 mg/24hr Place 1 patch onto the skin daily.      . sertraline (ZOLOFT) 50 MG tablet Take 50 mg by mouth daily.      . simvastatin (ZOCOR) 40 MG tablet Take 40 mg by mouth every evening.      . Tamsulosin HCl (FLOMAX) 0.4 MG CAPS Take 0.4 mg by mouth daily after supper.       . traMADol (ULTRAM) 50 MG tablet Take 50 mg by mouth every 8 (eight) hours as needed for pain.       . traZODone (DESYREL) 50 MG tablet Take 50 mg by mouth at bedtime.      . vitamin B-12 (CYANOCOBALAMIN) 1000 MCG tablet Take 1,000 mcg by mouth daily.       No current facility-administered medications for this visit.     Past Medical History  Diagnosis Date  . Dementia   . Subdural hematoma   . Prostate enlargement   . Pacemaker   . Sinoatrial node dysfunction     ROS:   All systems reviewed and negative except as noted in the HPI.   Past Surgical History  Procedure Laterality Date  . Burr hole        No family history on file.   History   Social History  . Marital Status: Married    Spouse Name: N/A    Number of Children: N/A  . Years of Education: N/A   Occupational History  . Not on file.   Social History Main Topics  . Smoking status: Never Smoker   . Smokeless tobacco: Not on file  . Alcohol Use: No  . Drug Use: No  . Sexually Active:    Other Topics Concern  . Not on file   Social History Narrative  . No narrative on file     BP 142/108  Pulse 114  Ht 5\' 10"  (1.778 m)  Wt 207 lb (93.895 kg)  BMI 29.7 kg/m2  Physical Exam:  Chronically ill appearing 79 year old man,NAD HEENT: Unremarkable, except for a healed left frontal temporal scar. Neck:  7 cm JVD, no thyromegally Lungs:  Clear except for rales in the bases, right greater than left. HEART:  Regular rate rhythm, no murmurs, no rubs, no clicks Abd:  soft, positive bowel sounds, no organomegally, no rebound, no guarding Ext:  2 plus pulses, no edema, no  cyanosis, no clubbing Skin:  No rashes no nodules Neuro:  CN II through XII intact, motor grossly intact   DEVICE  Normal device function.  See PaceArt for details.   Assess/Plan:

## 2012-07-16 LAB — PACEMAKER DEVICE OBSERVATION
AL IMPEDENCE PM: 380 Ohm
ATRIAL PACING PM: 56
BAMS-0001: 180 {beats}/min
BAMS-0003: 80 {beats}/min
BATTERY VOLTAGE: 2.95 V
BRDY-0002RV: 60 {beats}/min
BRDY-0003RV: 120 {beats}/min
BRDY-0004RV: 120 {beats}/min
RV LEAD AMPLITUDE: 12 mv

## 2012-09-15 ENCOUNTER — Encounter: Payer: Self-pay | Admitting: Internal Medicine

## 2012-09-15 ENCOUNTER — Other Ambulatory Visit: Payer: Self-pay

## 2012-10-20 ENCOUNTER — Encounter: Payer: Medicare Other | Admitting: *Deleted

## 2012-10-22 ENCOUNTER — Encounter: Payer: Self-pay | Admitting: *Deleted

## 2012-12-22 ENCOUNTER — Inpatient Hospital Stay (HOSPITAL_COMMUNITY)
Admission: EM | Admit: 2012-12-22 | Discharge: 2012-12-30 | DRG: 097 | Disposition: A | Discharge status: Skilled Nursing Facility | Payer: Medicare Other | Attending: Family Medicine | Admitting: Family Medicine

## 2012-12-22 ENCOUNTER — Encounter (HOSPITAL_COMMUNITY): Payer: Self-pay | Admitting: Emergency Medicine

## 2012-12-22 ENCOUNTER — Emergency Department (HOSPITAL_COMMUNITY): Payer: Medicare Other

## 2012-12-22 DIAGNOSIS — N400 Enlarged prostate without lower urinary tract symptoms: Secondary | ICD-10-CM | POA: Diagnosis present

## 2012-12-22 DIAGNOSIS — F0280 Dementia in other diseases classified elsewhere without behavioral disturbance: Secondary | ICD-10-CM | POA: Diagnosis present

## 2012-12-22 DIAGNOSIS — R404 Transient alteration of awareness: Secondary | ICD-10-CM | POA: Diagnosis present

## 2012-12-22 DIAGNOSIS — E876 Hypokalemia: Secondary | ICD-10-CM

## 2012-12-22 DIAGNOSIS — F0390 Unspecified dementia without behavioral disturbance: Secondary | ICD-10-CM | POA: Diagnosis present

## 2012-12-22 DIAGNOSIS — E860 Dehydration: Secondary | ICD-10-CM | POA: Diagnosis present

## 2012-12-22 DIAGNOSIS — R400 Somnolence: Secondary | ICD-10-CM | POA: Diagnosis present

## 2012-12-22 DIAGNOSIS — G309 Alzheimer's disease, unspecified: Secondary | ICD-10-CM | POA: Diagnosis present

## 2012-12-22 DIAGNOSIS — N179 Acute kidney failure, unspecified: Secondary | ICD-10-CM | POA: Diagnosis present

## 2012-12-22 DIAGNOSIS — Z950 Presence of cardiac pacemaker: Secondary | ICD-10-CM

## 2012-12-22 DIAGNOSIS — R7881 Bacteremia: Secondary | ICD-10-CM | POA: Diagnosis present

## 2012-12-22 DIAGNOSIS — I495 Sick sinus syndrome: Secondary | ICD-10-CM

## 2012-12-22 DIAGNOSIS — G9340 Encephalopathy, unspecified: Secondary | ICD-10-CM | POA: Diagnosis present

## 2012-12-22 DIAGNOSIS — G039 Meningitis, unspecified: Principal | ICD-10-CM | POA: Diagnosis present

## 2012-12-22 DIAGNOSIS — R4182 Altered mental status, unspecified: Secondary | ICD-10-CM | POA: Diagnosis present

## 2012-12-22 DIAGNOSIS — T424X5A Adverse effect of benzodiazepines, initial encounter: Secondary | ICD-10-CM | POA: Diagnosis present

## 2012-12-22 DIAGNOSIS — D649 Anemia, unspecified: Secondary | ICD-10-CM | POA: Diagnosis present

## 2012-12-22 DIAGNOSIS — E785 Hyperlipidemia, unspecified: Secondary | ICD-10-CM | POA: Diagnosis present

## 2012-12-22 DIAGNOSIS — I4891 Unspecified atrial fibrillation: Secondary | ICD-10-CM

## 2012-12-22 DIAGNOSIS — R509 Fever, unspecified: Secondary | ICD-10-CM | POA: Diagnosis present

## 2012-12-22 LAB — URINALYSIS, ROUTINE W REFLEX MICROSCOPIC
Bilirubin Urine: NEGATIVE
Glucose, UA: NEGATIVE mg/dL
Ketones, ur: NEGATIVE mg/dL
Leukocytes, UA: NEGATIVE
pH: 5 (ref 5.0–8.0)

## 2012-12-22 LAB — CBC WITH DIFFERENTIAL/PLATELET
Eosinophils Relative: 1 % (ref 0–5)
HCT: 36.4 % — ABNORMAL LOW (ref 39.0–52.0)
Hemoglobin: 12.1 g/dL — ABNORMAL LOW (ref 13.0–17.0)
Lymphocytes Relative: 5 % — ABNORMAL LOW (ref 12–46)
MCV: 93.3 fL (ref 78.0–100.0)
Monocytes Absolute: 0.4 10*3/uL (ref 0.1–1.0)
Monocytes Relative: 5 % (ref 3–12)
Neutro Abs: 6.3 10*3/uL (ref 1.7–7.7)
WBC: 7.1 10*3/uL (ref 4.0–10.5)

## 2012-12-22 LAB — BASIC METABOLIC PANEL
BUN: 20 mg/dL (ref 6–23)
CO2: 25 mEq/L (ref 19–32)
Chloride: 106 mEq/L (ref 96–112)
Creatinine, Ser: 1.37 mg/dL — ABNORMAL HIGH (ref 0.50–1.35)
Glucose, Bld: 112 mg/dL — ABNORMAL HIGH (ref 70–99)

## 2012-12-22 LAB — URINE MICROSCOPIC-ADD ON

## 2012-12-22 LAB — CG4 I-STAT (LACTIC ACID): Lactic Acid, Venous: 1.36 mmol/L (ref 0.5–2.2)

## 2012-12-22 MED ORDER — SODIUM CHLORIDE 0.9 % IV BOLUS (SEPSIS)
500.0000 mL | Freq: Once | INTRAVENOUS | Status: AC
  Administered 2012-12-22: 500 mL via INTRAVENOUS

## 2012-12-22 MED ORDER — DEXTROSE 5 % IV SOLN
1.0000 g | Freq: Once | INTRAVENOUS | Status: AC
  Administered 2012-12-22: 1 g via INTRAVENOUS
  Filled 2012-12-22: qty 10

## 2012-12-22 MED ORDER — ACETAMINOPHEN 325 MG PO TABS: 650.0000 mg | ORAL_TABLET | Freq: Once | ORAL | Status: DC

## 2012-12-22 MED ORDER — ACETAMINOPHEN 500 MG PO TABS
1000.0000 mg | ORAL_TABLET | Freq: Once | ORAL | Status: AC
  Administered 2012-12-22: 1000 mg via ORAL
  Filled 2012-12-22: qty 2

## 2012-12-22 NOTE — ED Notes (Signed)
EMS called to [facility].  Found patient in on couch.  Staff states that patient is  Altered from his normal dementia state.  Patient also has a large bruise on his right shoulder that staff  Can not explain where it came from

## 2012-12-22 NOTE — ED Provider Notes (Signed)
CSN: [id number]     Arrival date & time 12/22/12  1914 History   First MD Initiated Contact with Patient 12/22/12 1922     Chief Complaint  Patient presents with  . Altered Mental Status    HPI  Patient presents from his extended care facility. He resides in an Alzheimer's unit. Noted to be more confused than his baseline today transferred here for evaluation. Generalized weakness.  Not eating today.  Noted to be febrile upon his arrival. No known falls or injury,  discoloration is noted to his back. He has a linear areas of erythema and early ecchymosis to his bilateral posterior ribs. Somewhat of a linear pattern. No reported use of a heating pad, no report of a fall,  no report of  coin rubbing.  The patient is a level V caveat due to dementia remainder of his history is not obtainable.   Past Medical History  Diagnosis Date  . Dementia   . Subdural hematoma   . Prostate enlargement   . Pacemaker   . Sinoatrial node dysfunction    Past Surgical History  Procedure Laterality Date  . Burr hole     History reviewed. No pertinent family history. History  Substance Use Topics  . Smoking status: Never Smoker   . Smokeless tobacco: Not on file  . Alcohol Use: No    Review of Systems  Unable to perform ROS: Dementia    Allergies  Review of patient's allergies indicates no known allergies.  Home Medications   Current Outpatient Rx  Name  Route  Sig  Dispense  Refill  . cholecalciferol (VITAMIN D) 1000 UNITS tablet   Oral   Take 1,000 Units by mouth daily.         . finasteride (PROSCAR) 5 MG tablet   Oral   Take 5 mg by mouth at bedtime.          . lansoprazole (PREVACID) 30 MG capsule   Oral   Take 30 mg by mouth daily.         . memantine (NAMENDA) 10 MG tablet   Oral   Take 10 mg by mouth 2 (two) times daily.         Marland Kitchen nystatin cream (MYCOSTATIN)   Topical   Apply 1 application topically 2 (two) times daily.         . rivastigmine (EXELON) 9.5  mg/24hr   Transdermal   Place 1 patch onto the skin daily.         . sertraline (ZOLOFT) 25 MG tablet   Oral   Take 25 mg by mouth daily.         . simvastatin (ZOCOR) 40 MG tablet   Oral   Take 40 mg by mouth every evening.         . Tamsulosin HCl (FLOMAX) 0.4 MG CAPS   Oral   Take 0.4 mg by mouth daily after supper.          . traMADol (ULTRAM) 50 MG tablet   Oral   Take 25 mg by mouth every 6 (six) hours as needed for pain. Take 1/2 tab every 6 hours as needed for pain         . traZODone (DESYREL) 50 MG tablet   Oral   Take 25 mg by mouth at bedtime. Take 1/2 tab at night as needed for insomnia         . vitamin B-12 (CYANOCOBALAMIN) 1000 MCG tablet   Oral  Take 1,000 mcg by mouth daily.          BP 143/56  Pulse 73  Temp(Src) 102.2 F (39 C) (Rectal)  Resp 18  SpO2 96% Physical Exam  Constitutional: He appears well-developed. No distress.  HENT:  Head: Atraumatic.  Eyes: Pupils are equal, round, and reactive to light.  Neck: Normal range of motion. Neck supple. No spinous process tenderness and no muscular tenderness present.  Cardiovascular: Normal rate and regular rhythm.   Pulmonary/Chest: He has no decreased breath sounds. He has no wheezes. He has no rhonchi. He has no rales.    Clear lungs  Abdominal: There is no tenderness.  Neurological: He is alert. He has normal strength. No cranial nerve deficit or sensory deficit. GCS eye subscore is 4. GCS verbal subscore is 5. GCS motor subscore is 6.  Alert.  Demented.  No focal or asymmetric weakness.  Generalized weakness.  Cannot sit independently.   Skin:  Linear areas of erythema or ecchymosis to the back    ED Course  Procedures (including critical care time) Labs Review Labs Reviewed  CBC WITH DIFFERENTIAL - Abnormal; Notable for the following:    RBC 3.90 (*)    Hemoglobin 12.1 (*)    HCT 36.4 (*)    Neutrophils Relative % 89 (*)    Lymphocytes Relative 5 (*)    Lymphs Abs 0.3  (*)    All other components within normal limits  BASIC METABOLIC PANEL - Abnormal; Notable for the following:    Glucose, Bld 112 (*)    Creatinine, Ser 1.37 (*)    GFR calc non Af Amer 46 (*)    GFR calc Af Amer 53 (*)    All other components within normal limits  URINALYSIS, ROUTINE W REFLEX MICROSCOPIC - Abnormal; Notable for the following:    Color, Urine AMBER (*)    Specific Gravity, Urine 1.033 (*)    Hgb urine dipstick TRACE (*)    All other components within normal limits  URINE MICROSCOPIC-ADD ON - Abnormal; Notable for the following:    Bacteria, UA FEW (*)    All other components within normal limits  CULTURE, BLOOD (ROUTINE X 2)  CULTURE, BLOOD (ROUTINE X 2)  URINE CULTURE  CG4 I-STAT (LACTIC ACID)   Imaging Review Ct Head Wo Contrast  12/22/2012   CLINICAL DATA:  Altered mental status. Fall. Head trauma. Prior subdural hematoma.  EXAM: CT HEAD WITHOUT CONTRAST  TECHNIQUE: Contiguous axial images were obtained from the base of the skull through the vertex without intravenous contrast.  COMPARISON:  06/16/2012  FINDINGS: There is no evidence of intracranial hemorrhage, brain edema, or other signs of acute infarction. There is no evidence of intracranial mass lesion or mass effect. No abnormal extraaxial fluid collections are identified.  Mild to moderate diffuse cerebral atrophy is stable as well as mild chronic small vessel disease. Ventricles are stable in size. No evidence of acute skull fracture. 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  IMPRESSION: No acute findings.  Stable cerebral atrophy and chronic small vessel disease.   Electronically Signed   By: Myles Rosenthal M.D.   On: 12/22/2012 21:34   Dg Chest Port 1 View  12/22/2012   CLINICAL DATA:  Altered mental status. Weakness.  EXAM: PORTABLE CHEST - 1 VIEW  COMPARISON:  06/16/2012  FINDINGS: Low lung volumes noted. Heart size within normal limits. Both lungs are clear. Dual lead transvenous pacemaker remains  in appropriate position.  IMPRESSION: No  acute findings.   Electronically Signed   By: Myles Rosenthal M.D.   On: 12/22/2012 20:17    EKG Interpretation   None       MDM   1. Fever   2. Acute renal failure   3. Anemia   4. Dementia    No obvious source for infection. Just a few cells in his urine. Blood and urine cultures. Given IV Rocephin. He remains markedly weak. This is a diffuse nonfocal weakness. No signs of stroke. Repeat CT is normal. His for consideration of his history of subdural. I discussed the case with hospitalist team. He'll be admitted under their care.    Roney Marion, MD 12/23/12 [phone]

## 2012-12-22 NOTE — ED Notes (Signed)
Bed: WA17 Expected date:  Expected time:  Means of arrival:  Comments: EMS, Alt LOC, Fever

## 2012-12-22 NOTE — Progress Notes (Signed)
CSW met at bedside with pt and family.  Pt did not respond to CSW, but his son Papa, [place].  [phone])  at his bedside and confirmed that pt is a resident of [facility] and that the plan is for pt to return once he is medically stable.  Pts son reports that he shares HCPOA  With his brother, Akshith Moncus [phone]).  Pt son thanked CSW for concern.  Marva Panda, LCSWA  [phone]  .12/22/2012  10:00 pm

## 2012-12-22 NOTE — H&P (Signed)
[facility] History and Physical  Charles Lambert WJX:[id number] DOB: 1927-09-27 DOA: 12/22/2012  Referring physician: ER physician PCP: [name], MD   Chief Complaint: altered mental status  HPI:  79 year old male with past medical history of alzheimer's dementia, from nursing home, BPH, dyslipidemia who presented to [facility] ED from SNF for worsening mental status changes for past 1 day prior to this admission. Patient is not a good historian due to dementia and family is not at the bedside at this time to provide the details of medical history. In ED, BP is 143/56, HR 73 and T 102.76F. WBC count was WNL. BMP revealed creatinine of 1.37. CT head did not reveal acute intracranial findings. UA and CXR unremarkable. Pt was given 1 dose of empiric vanco and zosyn in ED>  Assessment and Plan:  Principal Problem:   Fever - unclear etiology - UA negative, CXR negative for acute cardiopulmonary process - given rocephin in ED and vancomycin - follow up blood culture. If no further fever consider stopping antibiotics. WBC count WNL Active Problems:   Acute encephalopathy - likely worsening dementia - continue namenda - PT/OT evaluation   Anemia - hemoglobin 12.1 on admission - no indications for transfusion   Acute renal failure - secondary to dehydration - continue IV fluids - follow up renal function in am   Dementia - continue namenda   Radiological Exams on Admission: Ct Head Wo Contrast 12/22/2012   IMPRESSION: No acute findings.  Stable cerebral atrophy and chronic small vessel disease.   Electronically Signed   By: Myles Rosenthal M.D.   On: 12/22/2012 21:34   Dg Chest Port 1 View 12/22/2012    IMPRESSION: No acute findings.   Electronically Signed   By: Myles Rosenthal M.D.   On: 12/22/2012 20:17    Code Status: Full Family Communication: Pt at bedside Disposition Plan: Admit for further evaluation  [name], MD  [facility] Pager [phone]  Review of  Systems:  Unable to obtain due to dementia      Past Medical History  Diagnosis Date  . Dementia   . Subdural hematoma   . Prostate enlargement   . Pacemaker   . Sinoatrial node dysfunction    Past Surgical History  Procedure Laterality Date  . Burr hole     Social History:  reports that he has never smoked. He does not have any smokeless tobacco history on file. He reports that he does not drink alcohol or use illicit drugs.  No Known Allergies  Family History: Family medical history significant for HTN, HLD   Prior to Admission medications   Medication Sig Start Date End Date Taking? Authorizing Provider  cholecalciferol (VITAMIN D) 1000 UNITS tablet Take 1,000 Units by mouth daily.   Yes Historical Provider, MD  finasteride (PROSCAR) 5 MG tablet Take 5 mg by mouth at bedtime.    Yes Historical Provider, MD  lansoprazole (PREVACID) 30 MG capsule Take 30 mg by mouth daily.   Yes Historical Provider, MD  memantine (NAMENDA) 10 MG tablet Take 10 mg by mouth 2 (two) times daily.   Yes Historical Provider, MD  nystatin cream (MYCOSTATIN) Apply 1 application topically 2 (two) times daily.   Yes Historical Provider, MD  rivastigmine (EXELON) 9.5 mg/24hr Place 1 patch onto the skin daily.   Yes Historical Provider, MD  sertraline (ZOLOFT) 25 MG tablet Take 25 mg by mouth daily.   Yes Historical Provider, MD  simvastatin (ZOCOR) 40 MG tablet  Take 40 mg by mouth every evening.   Yes Historical Provider, MD  Tamsulosin HCl (FLOMAX) 0.4 MG CAPS Take 0.4 mg by mouth daily after supper.    Yes Historical Provider, MD  traMADol (ULTRAM) 50 MG tablet Take 25 mg by mouth every 6 (six) hours as needed for pain. Take 1/2 tab every 6 hours as needed for pain   Yes Historical Provider, MD  traZODone (DESYREL) 50 MG tablet Take 25 mg by mouth at bedtime. Take 1/2 tab at night as needed for insomnia   Yes Historical Provider, MD  vitamin B-12 (CYANOCOBALAMIN) 1000 MCG tablet Take 1,000 mcg by mouth  daily.   Yes Historical Provider, MD   Physical Exam: Filed Vitals:   12/22/12 1919 12/22/12 1940 12/22/12 2203  BP: 128/61  143/56  Pulse: 60  73  Temp: 102.4 F (39.1 C) 102.2 F (39 C)   TempSrc: Oral Rectal   Resp: 18    SpO2: 96%      Physical Exam  Constitutional: Appears in No distress.  HENT: Normocephalic. External right and left ear normal. Dry mucus membranes Eyes: Conjunctivae and EOM are normal. PERRLA, no scleral icterus.  Neck: Normal ROM. Neck supple. No JVD. No tracheal deviation. No thyromegaly.  CVS: irregular rhythm, rate controlled, S1/S2 appreciated Pulmonary: Effort and breath sounds normal, no stridor, rhonchi, wheezes, rales.  Abdominal: Soft. BS +,  no distension, tenderness, rebound or guarding.  Musculoskeletal: Normal range of motion. No edema and no tenderness.  Lymphadenopathy: No lymphadenopathy noted, cervical, inguinal. Neuro: Alert. Normal reflexes, no focal neurologic deficits Skin: Skin is warm and dry. Some ecchymosis on chest.   Labs on Admission:  Basic Metabolic Panel:  Recent Labs Lab 12/22/12 1955  NA 139  K 3.6  CL 106  CO2 25  GLUCOSE 112*  BUN 20  CREATININE 1.37*  CALCIUM 8.8   Liver Function Tests: No results found for this basename: AST, ALT, ALKPHOS, BILITOT, PROT, ALBUMIN,  in the last 168 hours No results found for this basename: LIPASE, AMYLASE,  in the last 168 hours No results found for this basename: AMMONIA,  in the last 168 hours CBC:  Recent Labs Lab 12/22/12 1955  WBC 7.1  NEUTROABS 6.3  HGB 12.1*  HCT 36.4*  MCV 93.3  PLT 176   Cardiac Enzymes: No results found for this basename: CKTOTAL, CKMB, CKMBINDEX, TROPONINI,  in the last 168 hours BNP: No components found with this basename: POCBNP,  CBG: No results found for this basename: GLUCAP,  in the last 168 hours  If 7PM-7AM, please contact night-coverage [email or web address] Password [email or web address] 12/22/2012, 11:56 PM

## 2012-12-23 ENCOUNTER — Inpatient Hospital Stay (HOSPITAL_COMMUNITY)
Admit: 2012-12-23 | Discharge: 2012-12-23 | Disposition: A | Discharge status: Home / Self Care | Payer: Medicare Other | Attending: Internal Medicine | Admitting: Internal Medicine

## 2012-12-23 ENCOUNTER — Encounter (HOSPITAL_COMMUNITY): Payer: Self-pay | Admitting: *Deleted

## 2012-12-23 DIAGNOSIS — R4182 Altered mental status, unspecified: Secondary | ICD-10-CM

## 2012-12-23 LAB — COMPREHENSIVE METABOLIC PANEL
ALT: 10 U/L (ref 0–53)
AST: 22 U/L (ref 0–37)
Albumin: 3.1 g/dL — ABNORMAL LOW (ref 3.5–5.2)
CO2: 27 mEq/L (ref 19–32)
Calcium: 9.1 mg/dL (ref 8.4–10.5)
Creatinine, Ser: 1.36 mg/dL — ABNORMAL HIGH (ref 0.50–1.35)
GFR calc non Af Amer: 46 mL/min — ABNORMAL LOW (ref >90)
Potassium: 3.8 mEq/L (ref 3.5–5.1)
Sodium: 139 mEq/L (ref 135–145)

## 2012-12-23 LAB — CBC
HCT: 38.4 % — ABNORMAL LOW (ref 39.0–52.0)
Hemoglobin: 12.6 g/dL — ABNORMAL LOW (ref 13.0–17.0)
MCH: 30.8 pg (ref 26.0–34.0)
MCHC: 32.8 g/dL (ref 30.0–36.0)
MCV: 93.9 fL (ref 78.0–100.0)
RBC: 4.09 MIL/uL — ABNORMAL LOW (ref 4.22–5.81)
RDW: 13.5 % (ref 11.5–15.5)

## 2012-12-23 LAB — URINE CULTURE: Culture: NO GROWTH

## 2012-12-23 LAB — GLUCOSE, CAPILLARY: Glucose-Capillary: 106 mg/dL — ABNORMAL HIGH (ref 70–99)

## 2012-12-23 MED ORDER — PANTOPRAZOLE SODIUM 40 MG PO TBEC
40.0000 mg | DELAYED_RELEASE_TABLET | Freq: Every day | ORAL | Status: DC
  Administered 2012-12-24 – 2012-12-30 (×6): 40 mg via ORAL
  Filled 2012-12-23 (×8): qty 1

## 2012-12-23 MED ORDER — HALOPERIDOL LACTATE 5 MG/ML IJ SOLN
0.5000 mg | Freq: Once | INTRAMUSCULAR | Status: AC
  Administered 2012-12-23: 0.5 mg via INTRAVENOUS
  Filled 2012-12-23: qty 1

## 2012-12-23 MED ORDER — BIOTENE DRY MOUTH MT LIQD
15.0000 mL | Freq: Two times a day (BID) | OROMUCOSAL | Status: DC
  Administered 2012-12-23 – 2012-12-29 (×13): 15 mL via OROMUCOSAL

## 2012-12-23 MED ORDER — DEXTROSE 5 % IV SOLN
2.0000 g | Freq: Two times a day (BID) | INTRAVENOUS | Status: DC
  Administered 2012-12-23 – 2012-12-30 (×15): 2 g via INTRAVENOUS
  Filled 2012-12-23 (×18): qty 2

## 2012-12-23 MED ORDER — SODIUM CHLORIDE 0.9 % IV SOLN
2.0000 g | INTRAVENOUS | Status: DC
  Administered 2012-12-23 – 2012-12-26 (×19): 2 g via INTRAVENOUS
  Filled 2012-12-23 (×25): qty 2000

## 2012-12-23 MED ORDER — FINASTERIDE 5 MG PO TABS
5.0000 mg | ORAL_TABLET | Freq: Every day | ORAL | Status: DC
  Administered 2012-12-23 – 2012-12-29 (×8): 5 mg via ORAL
  Filled 2012-12-23 (×11): qty 1

## 2012-12-23 MED ORDER — ONDANSETRON HCL 4 MG/2ML IJ SOLN: 4.0000 mg | Freq: Four times a day (QID) | INTRAMUSCULAR | Status: DC | PRN

## 2012-12-23 MED ORDER — ACETAMINOPHEN 650 MG RE SUPP: 650.0000 mg | Freq: Four times a day (QID) | RECTAL | Status: DC | PRN

## 2012-12-23 MED ORDER — VITAMIN D3 25 MCG (1000 UNIT) PO TABS
1000.0000 [IU] | ORAL_TABLET | Freq: Every day | ORAL | Status: DC
  Administered 2012-12-24 – 2012-12-30 (×5): 1000 [IU] via ORAL
  Filled 2012-12-23 (×8): qty 1

## 2012-12-23 MED ORDER — ACETAMINOPHEN 325 MG PO TABS
650.0000 mg | ORAL_TABLET | Freq: Four times a day (QID) | ORAL | Status: DC | PRN
  Administered 2012-12-29 (×2): 650 mg via ORAL
  Filled 2012-12-23 (×2): qty 2

## 2012-12-23 MED ORDER — RIVASTIGMINE 9.5 MG/24HR TD PT24
9.5000 mg | MEDICATED_PATCH | Freq: Every day | TRANSDERMAL | Status: DC
  Administered 2012-12-23 – 2012-12-30 (×8): 9.5 mg via TRANSDERMAL
  Filled 2012-12-23 (×9): qty 1

## 2012-12-23 MED ORDER — SODIUM CHLORIDE 0.9 % IV SOLN
INTRAVENOUS | Status: DC
  Administered 2012-12-23 – 2012-12-26 (×5): via INTRAVENOUS
  Administered 2012-12-27: 1000 mL via INTRAVENOUS
  Administered 2012-12-28: 08:00:00 via INTRAVENOUS

## 2012-12-23 MED ORDER — VITAMIN B-12 1000 MCG PO TABS
1000.0000 ug | ORAL_TABLET | Freq: Every day | ORAL | Status: DC
  Administered 2012-12-24 – 2012-12-30 (×5): 1000 ug via ORAL
  Filled 2012-12-23 (×8): qty 1

## 2012-12-23 MED ORDER — SODIUM CHLORIDE 0.9 % IV SOLN
INTRAVENOUS | Status: DC
  Administered 2012-12-23: 02:00:00 via INTRAVENOUS

## 2012-12-23 MED ORDER — TRAZODONE 25 MG HALF TABLET
25.0000 mg | ORAL_TABLET | Freq: Every day | ORAL | Status: DC
  Administered 2012-12-23 – 2012-12-25 (×3): 25 mg via ORAL
  Filled 2012-12-23 (×6): qty 1

## 2012-12-23 MED ORDER — SODIUM CHLORIDE 0.9 % IJ SOLN
3.0000 mL | Freq: Two times a day (BID) | INTRAMUSCULAR | Status: DC
  Administered 2012-12-23 – 2012-12-27 (×4): 3 mL via INTRAVENOUS
  Administered 2012-12-29: 22:00:00 via INTRAVENOUS
  Administered 2012-12-30: 3 mL via INTRAVENOUS

## 2012-12-23 MED ORDER — TRAMADOL HCL 50 MG PO TABS: 25.0000 mg | ORAL_TABLET | Freq: Four times a day (QID) | ORAL | Status: DC | PRN

## 2012-12-23 MED ORDER — SERTRALINE HCL 25 MG PO TABS
25.0000 mg | ORAL_TABLET | Freq: Every day | ORAL | Status: DC
  Administered 2012-12-24 – 2012-12-30 (×6): 25 mg via ORAL
  Filled 2012-12-23 (×8): qty 1

## 2012-12-23 MED ORDER — ONDANSETRON HCL 4 MG PO TABS: 4.0000 mg | ORAL_TABLET | Freq: Four times a day (QID) | ORAL | Status: DC | PRN

## 2012-12-23 MED ORDER — VANCOMYCIN HCL IN DEXTROSE 750-5 MG/150ML-% IV SOLN
750.0000 mg | Freq: Two times a day (BID) | INTRAVENOUS | Status: DC
  Administered 2012-12-23 – 2012-12-30 (×14): 750 mg via INTRAVENOUS
  Filled 2012-12-23 (×18): qty 150

## 2012-12-23 MED ORDER — DEXTROSE 5 % IV SOLN
2.0000 g | INTRAVENOUS | Status: DC
  Filled 2012-12-23: qty 2

## 2012-12-23 MED ORDER — MEMANTINE HCL 10 MG PO TABS
10.0000 mg | ORAL_TABLET | Freq: Two times a day (BID) | ORAL | Status: DC
  Administered 2012-12-23 – 2012-12-30 (×14): 10 mg via ORAL
  Filled 2012-12-23 (×19): qty 1

## 2012-12-23 MED ORDER — TAMSULOSIN HCL 0.4 MG PO CAPS
0.4000 mg | ORAL_CAPSULE | Freq: Every day | ORAL | Status: DC
  Administered 2012-12-23 – 2012-12-29 (×6): 0.4 mg via ORAL
  Filled 2012-12-23 (×9): qty 1

## 2012-12-23 MED ORDER — VANCOMYCIN HCL IN DEXTROSE 1-5 GM/200ML-% IV SOLN
1000.0000 mg | Freq: Once | INTRAVENOUS | Status: AC
  Administered 2012-12-23: 1000 mg via INTRAVENOUS
  Filled 2012-12-23 (×2): qty 200

## 2012-12-23 MED ORDER — NYSTATIN 100000 UNIT/GM EX CREA
1.0000 "application " | TOPICAL_CREAM | Freq: Two times a day (BID) | CUTANEOUS | Status: DC
  Administered 2012-12-23 – 2012-12-30 (×12): 1 via TOPICAL
  Filled 2012-12-23 (×3): qty 15

## 2012-12-23 MED ORDER — SIMVASTATIN 40 MG PO TABS
40.0000 mg | ORAL_TABLET | Freq: Every evening | ORAL | Status: DC
Start: 2012-12-23 — End: 2012-12-30
  Administered 2012-12-23 – 2012-12-29 (×6): 40 mg via ORAL
  Filled 2012-12-23 (×9): qty 1

## 2012-12-23 MED ORDER — HYDROCODONE-ACETAMINOPHEN 5-325 MG PO TABS
1.0000 | ORAL_TABLET | ORAL | Status: DC | PRN
  Administered 2012-12-30: 1 via ORAL
  Filled 2012-12-23: qty 1

## 2012-12-23 NOTE — Progress Notes (Signed)
PT Cancellation Note  Patient Details Name: Charles Lambert MRN: [id number] DOB: Jul 15, 1927   Cancelled Treatment:    Reason Eval/Treat Not Completed: Patient at procedure or test/unavailable.  Pt having EEG.  Will check back as able to perform PT eval.   [name] 12/23/2012, 10:28 AM

## 2012-12-23 NOTE — Consult Note (Addendum)
NEURO HOSPITALIST CONSULT NOTE    Reason for Consult:AMS/meningitis/seizure  HPI:                                                                                                                                          Charles Lambert is an 79 y.o. male who resides in a memory unit of a nursing home.  Patient was recently seen in ED for transient AMS. At that time "RN noted that he was completely unresponsive when she went in to evaluate him but several minutes later he was talking and responding normally. Patient is complaining of a vague headache. He does have a history of holes to evacuate subdural hematoma. Family is present now and states that he is pretty much at his baseline mental status although somewhat more subdued than normal."  Son who is in the room had not seen him recently but ove r the past day, nursing home had noted he was not responding as usual and patient was brought to [facility] hospital.  On admission he was noted to have a temperature of 102.4, meningismus and AMS. HE was given i gram rocephine which seemed to help his MS slightly but this AM it was noted he was more altered and had significant neck stiffness. He currently is on Vanc, Rocphin and Ampicillin.  On consultation he is awake, not oriented, will not follow commands, agitated and when hands are unties he make a action like he is grabbing a ball from his mit and throwing it with his right hand.  When his son asks him --why are throwing a ball--hestates "i do not know". No TC activity is noted.    Past Medical History  Diagnosis Date  . Dementia   . Subdural hematoma   . Prostate enlargement   . Pacemaker   . Sinoatrial node dysfunction     Past Surgical History  Procedure Laterality Date  . Burr hole      Family History  Problem Relation Age of Onset  . Hypertension Mother   . Hypertension Father      Social History:  reports that he has never smoked. He does not have any smokeless  tobacco history on file. He reports that he does not drink alcohol or use illicit drugs.  No Known Allergies  MEDICATIONS:  Scheduled: . ampicillin (OMNIPEN) IV  2 g Intravenous Q4H  . antiseptic oral rinse  15 mL Mouth Rinse BID  . cefTRIAXone (ROCEPHIN)  IV  2 g Intravenous Q24H  . cholecalciferol  1,000 Units Oral Daily  . finasteride  5 mg Oral QHS  . memantine  10 mg Oral BID  . nystatin cream  1 application Topical BID  . pantoprazole  40 mg Oral Daily  . rivastigmine  9.5 mg Transdermal Daily  . sertraline  25 mg Oral Daily  . simvastatin  40 mg Oral QPM  . sodium chloride  3 mL Intravenous Q12H  . tamsulosin  0.4 mg Oral QPC supper  . traZODone  25 mg Oral QHS  . vancomycin  750 mg Intravenous Q12H  . vitamin B-12  1,000 mcg Oral Daily     ROS:                                                                                                                                       History obtained from son  General ROS: negative for - chills, fatigue, fever, night sweats, weight gain or weight loss Psychological ROS: negative for - behavioral disorder, hallucinations, memory difficulties, mood swings or suicidal ideation Ophthalmic ROS: negative for - blurry vision, double vision, eye pain or loss of vision ENT ROS: negative for - epistaxis, nasal discharge, oral lesions, sore throat, tinnitus or vertigo Allergy and Immunology ROS: negative for - hives or itchy/watery eyes Hematological and Lymphatic ROS: negative for - bleeding problems, bruising or swollen lymph nodes Endocrine ROS: negative for - galactorrhea, hair pattern changes, polydipsia/polyuria or temperature intolerance Respiratory ROS: negative for - cough, hemoptysis, shortness of breath or wheezing Cardiovascular ROS: negative for - chest pain, dyspnea on exertion, edema or irregular  heartbeat Gastrointestinal ROS: negative for - abdominal pain, diarrhea, hematemesis, nausea/vomiting or stool incontinence Genito-Urinary ROS: negative for - dysuria, hematuria, incontinence or urinary frequency/urgency Musculoskeletal ROS: negative for - joint swelling or muscular weakness Neurological ROS: as noted in HPI Dermatological ROS: negative for rash and skin lesion changes   Blood pressure 150/46, pulse 60, temperature 100.2 F (37.9 C), temperature source Axillary, resp. rate 20, height 5\' 10"  (1.778 m), weight 89.1 kg (196 lb 6.9 oz), SpO2 96.00%.   Neurologic Examination:                                                                                                      Mental Status: Drowsy, follows no  commands, unable to tell me place, date or year, agitated and resists all attempts to formally examine him. . Cranial Nerves: II: Discs flat bilaterally; Visual fields-blinks to threat bilaterally, pupils equal, round, reactive to light and accommodation III,IV, VI: oculocephalic intact V,VII: face symmetric, facial light touch sensation normal bilaterally VIII: hearing normal bilaterally IX,X: cough intact XI: bilateral shoulder shrug XII: midline tongue extension Motor: Moving all extremities antigravity with 5/5 strength when resisting my attempts to examine him.  Sensory: Pinprick and light touch intact throughout, bilaterally Deep Tendon Reflexes:  Right: Upper Extremity   Left: Upper extremity   biceps (C-5 to C-6) 2/4   biceps (C-5 to C-6) 2/4 tricep (C7) 2/4    triceps (C7) 2/4 Brachioradialis (C6) 2/4  Brachioradialis (C6) 2/4  Lower Extremity Lower Extremity  quadriceps (L-2 to L-4) 2/4   quadriceps (L-2 to L-4) 2/4 Achilles (S1) 2/4   Achilles (S1) 2/4  Plantars: Right: downgoing   Left: downgoing Cerebellar: Unable to assess Gait: not tested CV: pulses palpable throughout    No components found with this basename: cbc,  bmp,  coags,  chol,   tri,  ldl,  hga1c    Results for orders placed during the hospital encounter of 12/22/12 (from the past 48 hour(s))  CBC WITH DIFFERENTIAL     Status: Abnormal   Collection Time    12/22/12  7:55 PM      Result Value Range   WBC 7.1  4.0 - 10.5 K/uL   RBC 3.90 (*) 4.22 - 5.81 MIL/uL   Hemoglobin 12.1 (*) 13.0 - 17.0 g/dL   HCT 16.1 (*) 09.6 - 04.5 %   MCV 93.3  78.0 - 100.0 fL   MCH 31.0  26.0 - 34.0 pg   MCHC 33.2  30.0 - 36.0 g/dL   RDW 40.9  81.1 - 91.4 %   Platelets 176  150 - 400 K/uL   Neutrophils Relative % 89 (*) 43 - 77 %   Neutro Abs 6.3  1.7 - 7.7 K/uL   Lymphocytes Relative 5 (*) 12 - 46 %   Lymphs Abs 0.3 (*) 0.7 - 4.0 K/uL   Monocytes Relative 5  3 - 12 %   Monocytes Absolute 0.4  0.1 - 1.0 K/uL   Eosinophils Relative 1  0 - 5 %   Eosinophils Absolute 0.1  0.0 - 0.7 K/uL   Basophils Relative 0  0 - 1 %   Basophils Absolute 0.0  0.0 - 0.1 K/uL  BASIC METABOLIC PANEL     Status: Abnormal   Collection Time    12/22/12  7:55 PM      Result Value Range   Sodium 139  135 - 145 mEq/L   Potassium 3.6  3.5 - 5.1 mEq/L   Chloride 106  96 - 112 mEq/L   CO2 25  19 - 32 mEq/L   Glucose, Bld 112 (*) 70 - 99 mg/dL   BUN 20  6 - 23 mg/dL   Creatinine, Ser 7.82 (*) 0.50 - 1.35 mg/dL   Calcium 8.8  8.4 - 95.6 mg/dL   GFR calc non Af Amer 46 (*) >90 mL/min   GFR calc Af Amer 53 (*) >90 mL/min   Comment: (NOTE)     The eGFR has been calculated using the CKD EPI equation.     This calculation has not been validated in all clinical situations.     eGFR's persistently <90 mL/min signify possible Chronic Kidney  Disease.  CG4 I-STAT (LACTIC ACID)     Status: None   Collection Time    12/22/12  8:02 PM      Result Value Range   Lactic Acid, Venous 1.36  0.5 - 2.2 mmol/L  URINALYSIS, ROUTINE W REFLEX MICROSCOPIC     Status: Abnormal   Collection Time    12/22/12  8:34 PM      Result Value Range   Color, Urine AMBER (*) YELLOW   Comment: BIOCHEMICALS MAY BE AFFECTED BY  COLOR   APPearance CLEAR  CLEAR   Specific Gravity, Urine 1.033 (*) 1.005 - 1.030   pH 5.0  5.0 - 8.0   Glucose, UA NEGATIVE  NEGATIVE mg/dL   Hgb urine dipstick TRACE (*) NEGATIVE   Bilirubin Urine NEGATIVE  NEGATIVE   Ketones, ur NEGATIVE  NEGATIVE mg/dL   Protein, ur NEGATIVE  NEGATIVE mg/dL   Urobilinogen, UA 1.0  0.0 - 1.0 mg/dL   Nitrite NEGATIVE  NEGATIVE   Leukocytes, UA NEGATIVE  NEGATIVE  URINE MICROSCOPIC-ADD ON     Status: Abnormal   Collection Time    12/22/12  8:34 PM      Result Value Range   WBC, UA 0-2  <3 WBC/hpf   RBC / HPF 0-2  <3 RBC/hpf   Bacteria, UA FEW (*) RARE   Urine-Other MUCOUS PRESENT    MRSA PCR SCREENING     Status: None   Collection Time    12/23/12  1:50 AM      Result Value Range   MRSA by PCR NEGATIVE  NEGATIVE   Comment:            The GeneXpert MRSA Assay (FDA     approved for NASAL specimens     only), is one component of a     comprehensive MRSA colonization     surveillance program. It is not     intended to diagnose MRSA     infection nor to guide or     monitor treatment for     MRSA infections.  COMPREHENSIVE METABOLIC PANEL     Status: Abnormal   Collection Time    12/23/12  3:59 AM      Result Value Range   Sodium 139  135 - 145 mEq/L   Potassium 3.8  3.5 - 5.1 mEq/L   Chloride 105  96 - 112 mEq/L   CO2 27  19 - 32 mEq/L   Glucose, Bld 115 (*) 70 - 99 mg/dL   BUN 18  6 - 23 mg/dL   Creatinine, Ser 1.61 (*) 0.50 - 1.35 mg/dL   Calcium 9.1  8.4 - 09.6 mg/dL   Total Protein 5.8 (*) 6.0 - 8.3 g/dL   Albumin 3.1 (*) 3.5 - 5.2 g/dL   AST 22  0 - 37 U/L   ALT 10  0 - 53 U/L   Alkaline Phosphatase 43  39 - 117 U/L   Total Bilirubin 0.7  0.3 - 1.2 mg/dL   GFR calc non Af Amer 46 (*) >90 mL/min   GFR calc Af Amer 54 (*) >90 mL/min   Comment: (NOTE)     The eGFR has been calculated using the CKD EPI equation.     This calculation has not been validated in all clinical situations.     eGFR's persistently <90 mL/min signify  possible Chronic Kidney     Disease.  CBC     Status: Abnormal   Collection Time  12/23/12  3:59 AM      Result Value Range   WBC 4.7  4.0 - 10.5 K/uL   RBC 4.09 (*) 4.22 - 5.81 MIL/uL   Hemoglobin 12.6 (*) 13.0 - 17.0 g/dL   HCT 44.0 (*) 34.7 - 42.5 %   MCV 93.9  78.0 - 100.0 fL   MCH 30.8  26.0 - 34.0 pg   MCHC 32.8  30.0 - 36.0 g/dL   RDW 95.6  38.7 - 56.4 %   Platelets 157  150 - 400 K/uL  GLUCOSE, CAPILLARY     Status: Abnormal   Collection Time    12/23/12  7:47 AM      Result Value Range   Glucose-Capillary 106 (*) 70 - 99 mg/dL    Ct Head Wo Contrast  12/22/2012   CLINICAL DATA:  Altered mental status. Fall. Head trauma. Prior subdural hematoma.  EXAM: CT HEAD WITHOUT CONTRAST  TECHNIQUE: Contiguous axial images were obtained from the base of the skull through the vertex without intravenous contrast.  COMPARISON:  06/16/2012  FINDINGS: There is no evidence of intracranial hemorrhage, brain edema, or other signs of acute infarction. There is no evidence of intracranial mass lesion or mass effect. No abnormal extraaxial fluid collections are identified.  Mild to moderate diffuse cerebral atrophy is stable as well as mild chronic small vessel disease. Ventricles are stable in size. No evidence of acute skull fracture. 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  IMPRESSION: No acute findings.  Stable cerebral atrophy and chronic small vessel disease.   Electronically Signed   By: Myles Rosenthal M.D.   On: 12/22/2012 21:34   Dg Chest Port 1 View  12/22/2012   CLINICAL DATA:  Altered mental status. Weakness.  EXAM: PORTABLE CHEST - 1 VIEW  COMPARISON:  06/16/2012  FINDINGS: Low lung volumes noted. Heart size within normal limits. Both lungs are clear. Dual lead transvenous pacemaker remains in appropriate position.  IMPRESSION: No acute findings.   Electronically Signed   By: Myles Rosenthal M.D.   On: 12/22/2012 20:17     Assessment/Plan: 79 YO male with 1 day of increased  confusion in setting of temperature 102 and meningismus.  Etiology of fever is unclear at this time as CXR (-), UA(-) and BC pending.  Given physical findings of meningismus concerned for possible meningitis. Patient is very agitated and will need to have LP done under IR.   Recommend:  1) Continue ABX and agree with EEG 2) LP under fluoroscopy (ordered) with labs (ordered) 3) Have spoken with Dr. Elvera Lennox and patient will be transferred [facility] for closer monitoring by both specialties.   Assessment and plan discussed with with attending physician and they are in agreement.    Felicie Morn PA-C [facility] [phone]  12/23/2012, 10:54 AM  Patient seen and examined together with physician assistant and I concur with the assessment and plan.  Wyatt Portela, MD   Addendum; Patient very agitated. He requires sedation and LP under fluoroscopy. Already on empiric antibiotics.  Georga Hacking Raedyn Klinck,MD

## 2012-12-23 NOTE — Progress Notes (Signed)
CARE MANAGEMENT NOTE 12/23/2012  Patient:  Charles Lambert, Charles Lambert   Account Number:  [id number]  Date Initiated:  12/23/2012  Documentation initiated by:  DAVIS,RHONDA  Subjective/Objective Assessment:   pt from snf with temp 102 or greater and ams     Action/Plan:   snf when stable   Anticipated DC Date:  12/26/2012   Anticipated DC Plan:  SKILLED NURSING FACILITY  In-house referral  Clinical Social Worker      DC Planning Services  NA      [facility] Choice  NA   Choice offered to / List presented to:  NA   DME arranged  NA      DME agency  NA     HH arranged  NA      HH agency  NA   Status of service:  In process, will continue to follow Medicare Important Message given?  NA - LOS <3 / Initial given by admissions (If response is "NO", the following Medicare IM given date fields will be blank) Date Medicare IM given:   Date Additional Medicare IM given:    Discharge Disposition:    Per UR Regulation:  Reviewed for med. necessity/level of care/duration of stay  If discussed at Long Length of Stay Meetings, dates discussed:    Comments:  [id number]/Rhonda Stark Jock, BSN, [place] [phone] Chart Reviewed for discharge and hospital needs. Discharge needs at time of review:  None Review of patient progress due on [id number].

## 2012-12-23 NOTE — Progress Notes (Signed)
Report called to RN Paulette at [facility].

## 2012-12-23 NOTE — Progress Notes (Signed)
ANTIBIOTIC CONSULT NOTE   Pharmacy Consult for Vancomycin,  Ceftriaxone Indication: Fever  No Known Allergies  Patient Measurements: Height: 5\' 10"  (177.8 cm) Weight: 196 lb 6.9 oz (89.1 kg) IBW/kg (Calculated) : 73 Adjusted Body Weight:   Vital Signs: Temp: 100.2 F (37.9 C) (10/14 0637) Temp src: Axillary (10/14 0637) BP: 150/46 mmHg (10/14 0540) Pulse Rate: 60 (10/14 0637) Intake/Output from previous day: 10/13 0701 - 10/14 0700 In: 612.5 [I.V.:412.5; IV Piggyback:200] Out: -   Labs:  Recent Labs  12/22/12 1955 12/23/12 0359  WBC 7.1 4.7  HGB 12.1* 12.6*  PLT 176 157  CREATININE 1.37* 1.36*   Estimated Creatinine Clearance: 46.2 ml/min (by C-G formula based on Cr of 1.36). No results found for this basename: VANCOTROUGH, Leodis Binet, VANCORANDOM, GENTTROUGH, GENTPEAK, GENTRANDOM, TOBRATROUGH, TOBRAPEAK, TOBRARND, AMIKACINPEAK, AMIKACINTROU, AMIKACIN,  in the last 72 hours   Microbiology: Recent Results (from the past 720 hour(s))  MRSA PCR SCREENING     Status: None   Collection Time    12/23/12  1:50 AM      Result Value Range Status   MRSA by PCR NEGATIVE  NEGATIVE Final   Comment:            The GeneXpert MRSA Assay (FDA     approved for NASAL specimens     only), is one component of a     comprehensive MRSA colonization     surveillance program. It is not     intended to diagnose MRSA     infection nor to guide or     monitor treatment for     MRSA infections.    Medical History: Past Medical History  Diagnosis Date  . Dementia   . Subdural hematoma   . Prostate enlargement   . Pacemaker   . Sinoatrial node dysfunction     Medications:  Anti-infectives   Start     Dose/Rate Route Frequency Ordered Stop   12/23/12 1800  vancomycin (VANCOCIN) IVPB 750 mg/150 ml premix     750 mg 150 mL/hr over 60 Minutes Intravenous Every 12 hours 12/23/12 0540     12/23/12 1000  cefTRIAXone (ROCEPHIN) 2 g in dextrose 5 % 50 mL IVPB     2 g 100 mL/hr over  30 Minutes Intravenous Every 24 hours 12/23/12 0906     12/23/12 0930  ampicillin (OMNIPEN) 2 g in sodium chloride 0.9 % 50 mL IVPB     2 g 150 mL/hr over 20 Minutes Intravenous 6 times per day 12/23/12 0906     12/23/12 0015  vancomycin (VANCOCIN) IVPB 1000 mg/200 mL premix     1,000 mg 200 mL/hr over 60 Minutes Intravenous  Once 12/23/12 0008 12/23/12 0346   12/22/12 2200  cefTRIAXone (ROCEPHIN) 1 g in dextrose 5 % 50 mL IVPB     1 g 100 mL/hr over 30 Minutes Intravenous  Once 12/22/12 2158 12/22/12 2338     Assessment: 79 yo M admitted 10/13 with fever, confusion, meningismus.  Pharmacy is asked to dose Vancomycin and ceftriaxone for r/o meningitis.  Ampicillin 2g IV q4h per MD dosing.  Tmax: 102.4, currently 100.2  WBCs: remains wnl, 4.7  Renal: SCr remains elevated, 1.36, with CrCl ~ 46 ml/min CG  Cultures pending.  Goal of Therapy:  Vancomycin trough level 15-20 mcg/ml Appropriate abx dosing, eradication of infection.   Plan:   Increase ceftriaxone to 2g IV q12h  Continue vancomycin 750mg  IV q12h  Measure Vanc trough at steady state.  Follow up renal fxn and culture results.  Lynann Beaver PharmD, BCPS Pager [phone] 12/23/2012 12:12 PM

## 2012-12-23 NOTE — Progress Notes (Signed)
ANTIBIOTIC CONSULT NOTE - INITIAL  Pharmacy Consult for Vancomycin Indication: Fever  No Known Allergies  Patient Measurements: Height: 5\' 10"  (177.8 cm) Weight: 196 lb 6.9 oz (89.1 kg) IBW/kg (Calculated) : 73 Adjusted Body Weight:   Vital Signs: Temp: 98.8 F (37.1 C) (10/14 0100) Temp src: Oral (10/14 0100) BP: 150/46 mmHg (10/14 0540) Pulse Rate: 84 (10/14 0540) Intake/Output from previous day: 10/13 0701 - 10/14 0700 In: 200 [IV Piggyback:200] Out: -  Intake/Output from this shift: Total I/O In: 200 [IV Piggyback:200] Out: -   Labs:  Recent Labs  12/22/12 1955 12/23/12 0359  WBC 7.1 4.7  HGB 12.1* 12.6*  PLT 176 157  CREATININE 1.37* 1.36*   Estimated Creatinine Clearance: 46.2 ml/min (by C-G formula based on Cr of 1.36). No results found for this basename: VANCOTROUGH, Leodis Binet, VANCORANDOM, GENTTROUGH, GENTPEAK, GENTRANDOM, TOBRATROUGH, TOBRAPEAK, TOBRARND, AMIKACINPEAK, AMIKACINTROU, AMIKACIN,  in the last 72 hours   Microbiology: Recent Results (from the past 720 hour(s))  MRSA PCR SCREENING     Status: None   Collection Time    12/23/12  1:50 AM      Result Value Range Status   MRSA by PCR NEGATIVE  NEGATIVE Final   Comment:            The GeneXpert MRSA Assay (FDA     approved for NASAL specimens     only), is one component of a     comprehensive MRSA colonization     surveillance program. It is not     intended to diagnose MRSA     infection nor to guide or     monitor treatment for     MRSA infections.    Medical History: Past Medical History  Diagnosis Date  . Dementia   . Subdural hematoma   . Prostate enlargement   . Pacemaker   . Sinoatrial node dysfunction     Medications:  Anti-infectives   Start     Dose/Rate Route Frequency Ordered Stop   12/23/12 1800  vancomycin (VANCOCIN) IVPB 750 mg/150 ml premix     750 mg 150 mL/hr over 60 Minutes Intravenous Every 12 hours 12/23/12 0540     12/23/12 0015  vancomycin (VANCOCIN)  IVPB 1000 mg/200 mL premix     1,000 mg 200 mL/hr over 60 Minutes Intravenous  Once 12/23/12 0008 12/23/12 0346   12/22/12 2200  cefTRIAXone (ROCEPHIN) 1 g in dextrose 5 % 50 mL IVPB     1 g 100 mL/hr over 30 Minutes Intravenous  Once 12/22/12 2158 12/22/12 2338     Assessment: Patient with fever.  First dose of antibiotics already given.  Goal of Therapy:  Vancomycin trough level 15-20 mcg/ml  Plan:  Measure antibiotic drug levels at steady state Follow up culture results Vancomycin 750mg  iv q12hr  Aleene Davidson Crowford 12/23/2012,5:42 AM

## 2012-12-23 NOTE — Progress Notes (Signed)
EEG Completed; Results Pending  

## 2012-12-23 NOTE — Progress Notes (Signed)
INITIAL NUTRITION ASSESSMENT  DOCUMENTATION CODES Per approved criteria  -Not Applicable   INTERVENTION: Diet advancement per MD discretion Add nutritional supplements as needed Recommend Re-consulting dietitian when pt transferred to [facility]  NUTRITION DIAGNOSIS: Inadequate oral intake related to AMS and inability to eat as evidenced by NPO status.   Goal: Pt to meet >/= 90% of their estimated nutrition needs   Monitor:  Diet advancement PO intake Weight Labs  Reason for Assessment: Consult  79 y.o. male  Admitting Dx: Fever  ASSESSMENT: 79 year old male with past medical history of alzheimer's dementia, from nursing home, BPH, dyslipidemia who presented to [facility] ED from SNF for worsening mental status changes for past 1 day prior to this admission. Patient is not a good historian due to dementia. On admission he was noted to have a temperature of 102.4, meningismus and AMS.   Pt has been NPO all day. Per RN pt likely to be transferred to [facility]. Pt unable to provide any history. Per weight history pt's weight has been stable for the past year.   Height: Ht Readings from Last 1 Encounters:  12/23/12 5\' 10"  (1.778 m)    Weight: Wt Readings from Last 1 Encounters:  12/23/12 196 lb 6.9 oz (89.1 kg)    Ideal Body Weight: 166 lbs  % Ideal Body Weight: 118%  Wt Readings from Last 10 Encounters:  12/23/12 196 lb 6.9 oz (89.1 kg)  07/15/12 207 lb (93.895 kg)  01/10/12 207 lb (93.895 kg)  01/02/12 195 lb (88.451 kg)    Usual Body Weight: 195 lbs (October 2013)  % Usual Body Weight: 100%  BMI:  Body mass index is 28.18 kg/(m^2).  Estimated Nutritional Needs: Kcal: 1800-2000 Protein: 100-115 grams Fluid: 1.8-2 L/day  Skin: skin tear on upper back; ecchymosis on buttocks  Diet Order:    EDUCATION NEEDS: -No education needs identified at this time   Intake/Output Summary (Last 24 hours) at 12/23/12 1636 Last data filed at 12/23/12 0700  Gross per 24  hour  Intake  612.5 ml  Output      0 ml  Net  612.5 ml    Last BM: 10/14  Labs:   Recent Labs Lab 12/22/12 1955 12/23/12 0359  NA 139 139  K 3.6 3.8  CL 106 105  CO2 25 27  BUN 20 18  CREATININE 1.37* 1.36*  CALCIUM 8.8 9.1  GLUCOSE 112* 115*    CBG (last 3)   Recent Labs  12/23/12 0747  GLUCAP 106*    Scheduled Meds: . ampicillin (OMNIPEN) IV  2 g Intravenous Q4H  . antiseptic oral rinse  15 mL Mouth Rinse BID  . cefTRIAXone (ROCEPHIN)  IV  2 g Intravenous Q12H  . cholecalciferol  1,000 Units Oral Daily  . finasteride  5 mg Oral QHS  . memantine  10 mg Oral BID  . nystatin cream  1 application Topical BID  . pantoprazole  40 mg Oral Daily  . rivastigmine  9.5 mg Transdermal Daily  . sertraline  25 mg Oral Daily  . simvastatin  40 mg Oral QPM  . sodium chloride  3 mL Intravenous Q12H  . tamsulosin  0.4 mg Oral QPC supper  . traZODone  25 mg Oral QHS  . vancomycin  750 mg Intravenous Q12H  . vitamin B-12  1,000 mcg Oral Daily    Continuous Infusions: . sodium chloride 75 mL/hr at 12/23/12 0130  . sodium chloride 75 mL/hr at 12/23/12 1245  Past Medical History  Diagnosis Date  . Dementia   . Subdural hematoma   . Prostate enlargement   . Pacemaker   . Sinoatrial node dysfunction     Past Surgical History  Procedure Laterality Date  . Burr hole      Ian Malkin RD, LDN Inpatient Clinical Dietitian Pager: [phone] After Hours Pager: [phone]

## 2012-12-23 NOTE — Progress Notes (Signed)
Assessed patient for need for restraints. Patient was asleep and no longer pulling at lines.  Restraints removed and range of motion performed. Some redness where patient was pulling previously noted. Setzer, Don Broach

## 2012-12-23 NOTE — Progress Notes (Signed)
[facility] PROGRESS NOTE  Charles Lambert ION:[id number] DOB: 1927/10/29 DOA: 12/22/2012 PCP: [name], MD  HPI: 79 year old male with past medical history of alzheimer's dementia, from nursing home, BPH, dyslipidemia who presented to [facility] ED from SNF for worsening mental status changes for past 1 day prior to this admission. Patient is not a good historian due to dementia and family is not at the bedside at this time to provide the details of medical history. In ED, BP is 143/56, HR 73 and T 102.47F. WBC count was WNL. BMP revealed creatinine of 1.37. CT head did not reveal acute intracranial findings. UA and CXR unremarkable. Pt was given 1 dose of empiric vanco and zosyn in ED.  Assessment/Plan: Fever with acute encephalopathy with suspicion of meningitis - UA and CXR not impressive, but patient has probable meningismus this morning, concern for meningitis. Exam albeit difficult since he is combative and not fully participating. Started empiric coverage with Vancomycin, Ceftriaxone, Ampicillin; Neurology consulted and have recommended LP by IR and transfer to [facility].  Anemia - hemoglobin 12.1 on admission no indications for transfusion  Acute renal failure - secondary to dehydration continue IV fluids  Dementia - continue namenda  Diet: NPO Fluids: NS 75 cc/h DVT Prophylaxis: SCDs  Code Status: Full - son is to talk to his brother, and this may be readdressed Family Communication: son Baby in the room (cell [phone])  Disposition Plan: transfer to [facility]; plan for LP  Consultants:  Neurology  Procedures:  none   Antibiotics  Anti-infectives   Start     Dose/Rate Route Frequency Ordered Stop   12/23/12 1800  vancomycin (VANCOCIN) IVPB 750 mg/150 ml premix     750 mg 150 mL/hr over 60 Minutes Intravenous Every 12 hours 12/23/12 0540     12/23/12 1000  cefTRIAXone (ROCEPHIN) 2 g in dextrose 5 % 50 mL IVPB     2 g 100 mL/hr over 30 Minutes Intravenous Every 24 hours  12/23/12 0906     12/23/12 0930  ampicillin (OMNIPEN) 2 g in sodium chloride 0.9 % 50 mL IVPB     2 g 150 mL/hr over 20 Minutes Intravenous 6 times per day 12/23/12 0906     12/23/12 0015  vancomycin (VANCOCIN) IVPB 1000 mg/200 mL premix     1,000 mg 200 mL/hr over 60 Minutes Intravenous  Once 12/23/12 0008 12/23/12 0346   12/22/12 2200  cefTRIAXone (ROCEPHIN) 1 g in dextrose 5 % 50 mL IVPB     1 g 100 mL/hr over 30 Minutes Intravenous  Once 12/22/12 2158 12/22/12 2338     Antibiotics Given (last 72 hours)   Date/Time Action Medication Dose Rate   12/23/12 0246 Given   vancomycin (VANCOCIN) IVPB 1000 mg/200 mL premix 1,000 mg 200 mL/hr      HPI/Subjective: - confused, does not follow commands, combative. Mumbling unintelligibly   Objective: Filed Vitals:   12/23/12 0033 12/23/12 0100 12/23/12 0540 12/23/12 0637  BP:  150/60 150/46   Pulse:  118 84 60  Temp: 98.6 F (37 C) 98.8 F (37.1 C)  100.2 F (37.9 C)  TempSrc: Oral Oral  Axillary  Resp:  20  20  Height:  5\' 10"  (1.778 m)    Weight:  89.1 kg (196 lb 6.9 oz)    SpO2:  97%  96%    Intake/Output Summary (Last 24 hours) at 12/23/12 0918 Last data filed at 12/23/12 0700  Gross per 24 hour  Intake  612.5  ml  Output      0 ml  Net  612.5 ml   Filed Weights   12/23/12 0100  Weight: 89.1 kg (196 lb 6.9 oz)    Exam:  General:  Confused elderly caucasian male  Cardiovascular: regular rate and rhythm, without MRG  Respiratory: coarse breath sounds throughout  Abdomen: soft, not tender to palpation, positive bowel sounds  MSK: no peripheral edema  Neuro: moves all 4, not participating and combative; avoids light and withdraws to penlight.   Data Reviewed: Basic Metabolic Panel:  Recent Labs Lab 12/22/12 1955 12/23/12 0359  NA 139 139  K 3.6 3.8  CL 106 105  CO2 25 27  GLUCOSE 112* 115*  BUN 20 18  CREATININE 1.37* 1.36*  CALCIUM 8.8 9.1   Liver Function Tests:  Recent Labs Lab 12/23/12 0359   AST 22  ALT 10  ALKPHOS 43  BILITOT 0.7  PROT 5.8*  ALBUMIN 3.1*   CBC:  Recent Labs Lab 12/22/12 1955 12/23/12 0359  WBC 7.1 4.7  NEUTROABS 6.3  --   HGB 12.1* 12.6*  HCT 36.4* 38.4*  MCV 93.3 93.9  PLT 176 157   CBG:  Recent Labs Lab 12/23/12 0747  GLUCAP 106*    Recent Results (from the past 240 hour(s))  MRSA PCR SCREENING     Status: None   Collection Time    12/23/12  1:50 AM      Result Value Range Status   MRSA by PCR NEGATIVE  NEGATIVE Final   Comment:            The GeneXpert MRSA Assay (FDA     approved for NASAL specimens     only), is one component of a     comprehensive MRSA colonization     surveillance program. It is not     intended to diagnose MRSA     infection nor to guide or     monitor treatment for     MRSA infections.     Studies: Ct Head Wo Contrast  12/22/2012   CLINICAL DATA:  Altered mental status. Fall. Head trauma. Prior subdural hematoma.  EXAM: CT HEAD WITHOUT CONTRAST  TECHNIQUE: Contiguous axial images were obtained from the base of the skull through the vertex without intravenous contrast.  COMPARISON:  06/16/2012  FINDINGS: There is no evidence of intracranial hemorrhage, brain edema, or other signs of acute infarction. There is no evidence of intracranial mass lesion or mass effect. No abnormal extraaxial fluid collections are identified.  Mild to moderate diffuse cerebral atrophy is stable as well as mild chronic small vessel disease. Ventricles are stable in size. No evidence of acute skull fracture. 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  IMPRESSION: No acute findings.  Stable cerebral atrophy and chronic small vessel disease.   Electronically Signed   By: Myles Rosenthal M.D.   On: 12/22/2012 21:34   Dg Chest Port 1 View  12/22/2012   CLINICAL DATA:  Altered mental status. Weakness.  EXAM: PORTABLE CHEST - 1 VIEW  COMPARISON:  06/16/2012  FINDINGS: Low lung volumes noted. Heart size within normal limits. Both lungs  are clear. Dual lead transvenous pacemaker remains in appropriate position.  IMPRESSION: No acute findings.   Electronically Signed   By: Myles Rosenthal M.D.   On: 12/22/2012 20:17   Scheduled Meds: . ampicillin (OMNIPEN) IV  2 g Intravenous Q4H  . antiseptic oral rinse  15 mL Mouth Rinse BID  . cefTRIAXone (ROCEPHIN)  IV  2 g Intravenous Q24H  . cholecalciferol  1,000 Units Oral Daily  . finasteride  5 mg Oral QHS  . memantine  10 mg Oral BID  . nystatin cream  1 application Topical BID  . pantoprazole  40 mg Oral Daily  . rivastigmine  9.5 mg Transdermal Daily  . sertraline  25 mg Oral Daily  . simvastatin  40 mg Oral QPM  . sodium chloride  3 mL Intravenous Q12H  . tamsulosin  0.4 mg Oral QPC supper  . traZODone  25 mg Oral QHS  . vancomycin  750 mg Intravenous Q12H  . vitamin B-12  1,000 mcg Oral Daily   Continuous Infusions: . sodium chloride 75 mL/hr at 12/23/12 0130   Principal Problem:   Fever Active Problems:   Anemia   Acute renal failure   Dementia  Time spent: 73  Pamella Pert, MD [facility] Pager [phone]. If 7 PM - 7 AM, please contact night-coverage at [email or web address], password [email or web address] 12/23/2012, 9:18 AM  LOS: 1 day

## 2012-12-24 ENCOUNTER — Inpatient Hospital Stay (HOSPITAL_COMMUNITY): Payer: Medicare Other

## 2012-12-24 DIAGNOSIS — R7881 Bacteremia: Secondary | ICD-10-CM | POA: Diagnosis present

## 2012-12-24 LAB — BASIC METABOLIC PANEL
BUN: 19 mg/dL (ref 6–23)
CO2: 26 mEq/L (ref 19–32)
Calcium: 8 mg/dL — ABNORMAL LOW (ref 8.4–10.5)
GFR calc non Af Amer: 52 mL/min — ABNORMAL LOW (ref >90)
Glucose, Bld: 94 mg/dL (ref 70–99)
Potassium: 3.5 mEq/L (ref 3.5–5.1)
Sodium: 142 mEq/L (ref 135–145)

## 2012-12-24 LAB — CBC
HCT: 35.1 % — ABNORMAL LOW (ref 39.0–52.0)
Hemoglobin: 12 g/dL — ABNORMAL LOW (ref 13.0–17.0)
MCH: 31.3 pg (ref 26.0–34.0)
MCHC: 34.2 g/dL (ref 30.0–36.0)
MCV: 91.4 fL (ref 78.0–100.0)
RBC: 3.84 MIL/uL — ABNORMAL LOW (ref 4.22–5.81)

## 2012-12-24 LAB — GLUCOSE, CAPILLARY: Glucose-Capillary: 102 mg/dL — ABNORMAL HIGH (ref 70–99)

## 2012-12-24 NOTE — Procedures (Signed)
ELECTROENCEPHALOGRAM REPORT   Patient: Charles Lambert       Room #: ZO1096 EEG No. ID: [id number] Age: 79 y.o.        Sex: male Referring Physician: Elvera Lennox Report Date:  12/24/2012        Interpreting Physician: Thana Farr D  History: Charles Lambert is an 79 y.o. male with an altered mental status  Medications:  Scheduled: . ampicillin (OMNIPEN) IV  2 g Intravenous Q4H  . antiseptic oral rinse  15 mL Mouth Rinse BID  . cefTRIAXone (ROCEPHIN)  IV  2 g Intravenous Q12H  . cholecalciferol  1,000 Units Oral Daily  . finasteride  5 mg Oral QHS  . memantine  10 mg Oral BID  . nystatin cream  1 application Topical BID  . pantoprazole  40 mg Oral Daily  . rivastigmine  9.5 mg Transdermal Daily  . sertraline  25 mg Oral Daily  . simvastatin  40 mg Oral QPM  . sodium chloride  3 mL Intravenous Q12H  . tamsulosin  0.4 mg Oral QPC supper  . traZODone  25 mg Oral QHS  . vancomycin  750 mg Intravenous Q12H  . vitamin B-12  1,000 mcg Oral Daily    Conditions of Recording:  This is a 16 channel EEG carried out with the patient in the drowsy and asleep states.  Description:  The patient does not appear to be fully awake throughout the tracing.  The majority of the tracing occurs during drowse.  During drowse the background activity consists of a polymorphic delta activity that is seen over both hemispheres.  Some superimposed theta activity is noted as well.   The patient goes in to a light sleep with symmetrical sleep spindles, vertex central sharp transients and irregular slow activity.   Hyperventilation and intermittent photic stimulation were not performed.  IMPRESSION: This is a normal drowsy and asleep EEG.  No epileptiform activity is noted.     Thana Farr, MD [facility] [phone] 12/24/2012, 12:00 AM

## 2012-12-24 NOTE — Evaluation (Signed)
Physical Therapy Evaluation Patient Details Name: Charles Lambert MRN: [id number] DOB: 12-24-1927 Today's Date: 12/24/2012 Time: 1478-2956 PT Time Calculation (min): 37 min  PT Assessment / Plan / Recommendation History of Present Illness  pt presents with AMS and r/o meningitis.    Clinical Impression  Pt difficult to maintain arousal, but with cueing can participate in mobility.  While sitting pt coughing up thick secretions.  RN aware.  Will continue to follow.      PT Assessment  Patient needs continued PT services    Follow Up Recommendations  SNF    Does the patient have the potential to tolerate intense rehabilitation      Barriers to Discharge        Equipment Recommendations  None recommended by PT    Recommendations for Other Services     Frequency Min 3X/week    Precautions / Restrictions Precautions Precautions: Fall Restrictions Weight Bearing Restrictions: No   Pertinent Vitals/Pain Did not indicate pain.        Mobility  Bed Mobility Bed Mobility: Supine to Sit;Sitting - Scoot to Edge of Bed Supine to Sit: 2: Max assist Sitting - Scoot to [place] of Bed: 2: Max assist Details for Bed Mobility Assistance: step-by-step cueing for safe technique and encouragement.   Transfers Transfers: Sit to Stand;Stand to Sit;Stand Pivot Transfers Sit to Stand: 3: Mod assist;With upper extremity assist;From bed Stand to Sit: 3: Mod assist;With upper extremity assist;To chair/3-in-1 Stand Pivot Transfers: 2: Max assist Details for Transfer Assistance: cues for use of UEs as pt is used to reaching out for a CG from his ALF to A with his sit to stand.   Ambulation/Gait Ambulation/Gait Assistance: Not tested (comment) Stairs: No Wheelchair Mobility Wheelchair Mobility: No    Exercises     PT Diagnosis: Difficulty walking;Generalized weakness  PT Problem List: Decreased strength;Decreased activity tolerance;Decreased balance;Decreased mobility;Decreased knowledge  of use of DME;Decreased cognition PT Treatment Interventions: DME instruction;Gait training;Functional mobility training;Therapeutic activities;Therapeutic exercise;Balance training;Neuromuscular re-education;Cognitive remediation;Patient/family education     PT Goals(Current goals can be found in the care plan section) Acute Rehab PT Goals Patient Stated Goal: None stated.   PT Goal Formulation: With patient Time For Goal Achievement: 01/07/13 Potential to Achieve Goals: Fair  Visit Information  Last PT Received On: 12/24/12 Assistance Needed: +2 History of Present Illness: pt presents with AMS and r/o meningitis.         Prior Functioning  Home Living Family/patient expects to be discharged to:: Skilled nursing facility Prior Function Level of Independence: Needs assistance Gait / Transfers Assistance Needed: pt needs A to stand from low surfaces.   ADL's / Homemaking Assistance Needed: Set-up for ADLs.   Communication Communication: No difficulties    Cognition  Cognition Arousal/Alertness: Lethargic Behavior During Therapy: Flat affect Overall Cognitive Status: Difficult to assess Difficult to assess due to: Level of arousal    Extremity/Trunk Assessment Upper Extremity Assessment Upper Extremity Assessment: Generalized weakness Lower Extremity Assessment Lower Extremity Assessment: Generalized weakness   Balance Balance Balance Assessed: Yes Static Sitting Balance Static Sitting - Balance Support: Bilateral upper extremity supported;Feet supported Static Sitting - Level of Assistance: 4: Min assist Static Sitting - Comment/# of Minutes: Pending pt's arousal he was close S at best for maintaining balance.    End of Session PT - End of Session Equipment Utilized During Treatment: Gait belt Activity Tolerance: Patient tolerated treatment well Patient left: in chair;with call bell/phone within reach;with family/visitor present;with nursing/sitter in room Nurse  Communication:  Mobility status  GP     Sunny Schlein, [place] [phone] 12/24/2012, 12:14 PM

## 2012-12-24 NOTE — Progress Notes (Signed)
NEURO HOSPITALIST PROGRESS NOTE   SUBJECTIVE:                                                                                                                        Patient is drowsy, able to follow simple minimal commands, states he has no neck pain.   OBJECTIVE:                                                                                                                           Vital signs in last 24 hours: Temp:  [98.2 F (36.8 C)-98.9 F (37.2 C)] 98.4 F (36.9 C) (10/15 0453) Pulse Rate:  [61-75] 62 (10/15 0453) Resp:  [18-20] 18 (10/15 0453) BP: (107-135)/(52-73) 135/69 mmHg (10/15 0453) SpO2:  [94 %-99 %] 94 % (10/15 0453) Weight:  [87.6 kg (193 lb 2 oz)-89.4 kg (197 lb 1.5 oz)] 89.4 kg (197 lb 1.5 oz) (10/15 0500)  Intake/Output from previous day:   Intake/Output this shift:   Nutritional status: Cardiac  Past Medical History  Diagnosis Date  . Dementia   . Subdural hematoma   . Prostate enlargement   . Pacemaker   . Sinoatrial node dysfunction      Neurologic Exam:  Mental Status: Patient is sitting in chair, he will answer some questions--he denies neck pain and was able to look to the left and right with minimal movement. His speech was clear, although limited to "yes" and "no" answers. When asked to open his eyes he, lifted his eyebrows but did not open his eyes.  Cranial Nerves: II:  Visual fields -blinked to threat when eyelids held open.  pupils equal, round, reactive  III,IV, VI: bilaterally would not open eyes but attempted, doll's intact V,VII: face asymmetric with slight right NL fold decrease, facial light touch sensation normal bilaterally VIII: hearing normal bilaterally IX,X: gag reflex present XI: bilateral shoulder shrug XII: midline tongue extension Motor: Able to lift arms off his legs and show 4/5 strength bilaterally--increased tone to PROM of bilateral arms.  Able to move both legs and wiggle toes  but could not lift legs off the bed.  Bilateral legs were stiff and difficulty to break at the knees. Bilateral DF and PF 5/5 Tone and bulk:normal  tone throughout; no atrophy noted Sensory: Pinprick and light touch intact throughout, bilaterally Deep Tendon Reflexes:  Right: Upper Extremity   Left: Upper extremity   biceps (C-5 to C-6) 2/4   biceps (C-5 to C-6) 2/4 tricep (C7) 2/4    triceps (C7) 2/4 Brachioradialis (C6) 2/4  Brachioradialis (C6) 2/4  Lower Extremity Lower Extremity  quadriceps (L-2 to L-4) 1/4   quadriceps (L-2 to L-4) 1/4 Achilles (S1) 1/4   Achilles (S1) 1/4  Plantars: Right: downgoing   Left: downgoing    Lab Results: No results found for this basename: cbc, bmp, coags, chol, tri, ldl, hga1c   Lipid Panel No results found for this basename: CHOL, TRIG, HDL, CHOLHDL, VLDL, LDLCALC,  in the last 72 hours  Studies/Results: Ct Head Wo Contrast  12/22/2012   CLINICAL DATA:  Altered mental status. Fall. Head trauma. Prior subdural hematoma.  EXAM: CT HEAD WITHOUT CONTRAST  TECHNIQUE: Contiguous axial images were obtained from the base of the skull through the vertex without intravenous contrast.  COMPARISON:  06/16/2012  FINDINGS: There is no evidence of intracranial hemorrhage, brain edema, or other signs of acute infarction. There is no evidence of intracranial mass lesion or mass effect. No abnormal extraaxial fluid collections are identified.  Mild to moderate diffuse cerebral atrophy is stable as well as mild chronic small vessel disease. Ventricles are stable in size. No evidence of acute skull fracture. 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  IMPRESSION: No acute findings.  Stable cerebral atrophy and chronic small vessel disease.   Electronically Signed   By: Myles Rosenthal M.D.   On: 12/22/2012 21:34   Dg Chest Port 1 View  12/22/2012   CLINICAL DATA:  Altered mental status. Weakness.  EXAM: PORTABLE CHEST - 1 VIEW  COMPARISON:  06/16/2012  FINDINGS:  Low lung volumes noted. Heart size within normal limits. Both lungs are clear. Dual lead transvenous pacemaker remains in appropriate position.  IMPRESSION: No acute findings.   Electronically Signed   By: Myles Rosenthal M.D.   On: 12/22/2012 20:17    MEDICATIONS                                                                                                                        Scheduled: . ampicillin (OMNIPEN) IV  2 g Intravenous Q4H  . antiseptic oral rinse  15 mL Mouth Rinse BID  . cefTRIAXone (ROCEPHIN)  IV  2 g Intravenous Q12H  . cholecalciferol  1,000 Units Oral Daily  . finasteride  5 mg Oral QHS  . memantine  10 mg Oral BID  . nystatin cream  1 application Topical BID  . pantoprazole  40 mg Oral Daily  . rivastigmine  9.5 mg Transdermal Daily  . sertraline  25 mg Oral Daily  . simvastatin  40 mg Oral QPM  . sodium chloride  3 mL Intravenous Q12H  . tamsulosin  0.4 mg Oral QPC supper  . traZODone  25 mg Oral QHS  .  vancomycin  750 mg Intravenous Q12H  . vitamin B-12  1,000 mcg Oral Daily   ZOX:WRUE is a normal drowsy and asleep EEG. No epileptiform activity is noted.     ASSESSMENT/PLAN:                                                                                                            79 YO male with 1 day of increased confusion in setting of temperature 102 and meningismus. Etiology of fever is unclear at this time as CXR (-), UA(-) and BC pending. Given physical findings of meningismus concerned for possible meningitis. Patient currently on Vanc, AMP, and Rocephin. Currently he is Afebrile and WBC 7.2.  EEG is negative for seizure. He is less agitated but follows no complex commands. He shows increased rigidity in lower extremities and to get patient in a fetal position would not be possible. Again, will need to have LP done under IR which has been discussed with IR.     Recommend:  1) Continue ABX  2) LP under fluoroscopy (ordered) with labs (ordered)      Assessment and plan discussed with with attending physician and they are in agreement.    Felicie Morn PA-C [facility] [phone]  12/24/2012, 10:53 AM

## 2012-12-24 NOTE — Progress Notes (Signed)
[facility] PROGRESS NOTE  GEDALIA MCMILLON ZOX:[id number] DOB: 1927/03/23 DOA: 12/22/2012 PCP: [name], MD  Brief Narrative: 79 year old male with past medical history of alzheimer's dementia, from nursing home, BPH, dyslipidemia who presented to [facility] ED from SNF for worsening mental status changes for past 1 day prior to this admission. Patient is not a good historian due to dementia and family is not at the bedside at this time to provide the details of medical history. In ED, BP is 143/56, HR 73 and T 102.18F. WBC count was WNL. BMP revealed creatinine of 1.37. CT head did not reveal acute intracranial findings. UA and CXR unremarkable. Pt was given 1 dose of empiric vanco and zosyn in ED.   Assessment/Plan: 1. Fever with acute encephalopathy with suspicion of meningitis - UA and CXR negative. - Neurology on board and recommending LP by IR. Reportedly they have contacted IR - Will plan on continuing broad spectrum antibiotics (Amp, Rocephin, and Vanc)  2. Bacteremia - Growing Gram positive cocci - Currently on Ampicillin, Rocephin, and Vancomycin - source unknown. LP pending  3. Anemia  - hemoglobin stable, no active bleeding.  4. Acute renal failure  - Improving with IVF rehydration.  5. Dementia  - continue namenda  Code Status: Full Family Communication: Discussed with Kathlene November (his son) Disposition Plan: Pending further work up and evaluation  Consultants:  Neurology  Procedures:  LP pending  Antibiotics:  As listed above.  HPI/Subjective: No new complaints. No acute issues overnight.  Objective: Filed Vitals:   12/24/12 1325  BP: 129/68  Pulse: 60  Temp: 98 F (36.7 C)  Resp: 18   No intake or output data in the 24 hours ending 12/24/12 1607 Filed Weights   12/23/12 0100 12/23/12 1703 12/24/12 0500  Weight: 89.1 kg (196 lb 6.9 oz) 87.6 kg (193 lb 2 oz) 89.4 kg (197 lb 1.5 oz)    Exam:   General:  Pt in NAD, Alert and Awake    Cardiovascular: RRR, no MRG  Respiratory: CTA BL, no wheezes  Abdomen: soft, NT, ND  Musculoskeletal: warm and dry   Data Reviewed: Basic Metabolic Panel:  Recent Labs Lab 12/22/12 1955 12/23/12 0359 12/24/12 0608  NA 139 139 142  K 3.6 3.8 3.5  CL 106 105 108  CO2 25 27 26   GLUCOSE 112* 115* 94  BUN 20 18 19   CREATININE 1.37* 1.36* 1.24  CALCIUM 8.8 9.1 8.0*   Liver Function Tests:  Recent Labs Lab 12/23/12 0359  AST 22  ALT 10  ALKPHOS 43  BILITOT 0.7  PROT 5.8*  ALBUMIN 3.1*   No results found for this basename: LIPASE, AMYLASE,  in the last 168 hours No results found for this basename: AMMONIA,  in the last 168 hours CBC:  Recent Labs Lab 12/22/12 1955 12/23/12 0359 12/24/12 0608  WBC 7.1 4.7 7.2  NEUTROABS 6.3  --   --   HGB 12.1* 12.6* 12.0*  HCT 36.4* 38.4* 35.1*  MCV 93.3 93.9 91.4  PLT 176 157 137*   Cardiac Enzymes: No results found for this basename: CKTOTAL, CKMB, CKMBINDEX, TROPONINI,  in the last 168 hours BNP (last 3 results) No results found for this basename: PROBNP,  in the last 8760 hours CBG:  Recent Labs Lab 12/23/12 0747 12/24/12 0638  GLUCAP 106* 102*    Recent Results (from the past 240 hour(s))  CULTURE, BLOOD (ROUTINE X 2)     Status: None   Collection Time  12/22/12  7:55 PM      Result Value Range Status   Specimen Description BLOOD RIGHT HAND   Final   Special Requests BOTTLES DRAWN AEROBIC AND ANAEROBIC   Final   Culture  Setup Time     Final   Value: 12/23/2012 02:13     Performed at [facility]   Culture     Final   Value:        BLOOD CULTURE RECEIVED NO GROWTH TO DATE CULTURE WILL BE HELD FOR 5 DAYS BEFORE ISSUING A FINAL NEGATIVE REPORT     Performed at [facility]   Report Status PENDING   Incomplete  CULTURE, BLOOD (ROUTINE X 2)     Status: None   Collection Time    12/22/12  8:00 PM      Result Value Range Status   Specimen Description BLOOD RIGHT ARM   Final    Special Requests BOTTLES DRAWN AEROBIC AND ANAEROBIC   Final   Culture  Setup Time     Final   Value: 12/23/2012 02:13     Performed at [facility]   Culture     Final   Value: GRAM POSITIVE COCCI IN CLUSTERS     Note: Gram Stain Report Called to,Read Back By and Verified With: ANGELITO BY INGRAM A 12/24/12 1145AM     Performed at [facility]   Report Status PENDING   Incomplete  URINE CULTURE     Status: None   Collection Time    12/22/12  8:34 PM      Result Value Range Status   Specimen Description URINE, CATHETERIZED   Final   Special Requests NONE   Final   Culture  Setup Time     Final   Value: 12/23/2012 00:29     Performed at [facility] Count     Final   Value: NO GROWTH     Performed at [facility]   Culture     Final   Value: NO GROWTH     Performed at [facility]   Report Status 12/23/2012 FINAL   Final  MRSA PCR SCREENING     Status: None   Collection Time    12/23/12  1:50 AM      Result Value Range Status   MRSA by PCR NEGATIVE  NEGATIVE Final   Comment:            The GeneXpert MRSA Assay (FDA     approved for NASAL specimens     only), is one component of a     comprehensive MRSA colonization     surveillance program. It is not     intended to diagnose MRSA     infection nor to guide or     monitor treatment for     MRSA infections.     Studies: Ct Head Wo Contrast  12/24/2012   CLINICAL DATA:  Confusion.  EXAM: CT HEAD WITHOUT CONTRAST  TECHNIQUE: Contiguous axial images were obtained from the base of the skull through the vertex without intravenous contrast.  COMPARISON:  12/22/2012 and 06/16/2012.  FINDINGS: Prior for burrhole procedure for drainage of left-sided subdural hematoma as per history provided.  No intracranial hemorrhage.  Small vessel disease type changes without CT evidence of large acute infarct.  Global atrophy without hydrocephalus.  No intracranial mass lesion noted on this  unenhanced exam.  Vascular calcifications.  IMPRESSION:  No acute abnormality. Please see above.   Electronically Signed   By: Bridgett Larsson M.D.   On: 12/24/2012 12:54   Ct Head Wo Contrast  12/22/2012   CLINICAL DATA:  Altered mental status. Fall. Head trauma. Prior subdural hematoma.  EXAM: CT HEAD WITHOUT CONTRAST  TECHNIQUE: Contiguous axial images were obtained from the base of the skull through the vertex without intravenous contrast.  COMPARISON:  06/16/2012  FINDINGS: There is no evidence of intracranial hemorrhage, brain edema, or other signs of acute infarction. There is no evidence of intracranial mass lesion or mass effect. No abnormal extraaxial fluid collections are identified.  Mild to moderate diffuse cerebral atrophy is stable as well as mild chronic small vessel disease. Ventricles are stable in size. No evidence of acute skull fracture. 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  IMPRESSION: No acute findings.  Stable cerebral atrophy and chronic small vessel disease.   Electronically Signed   By: Myles Rosenthal M.D.   On: 12/22/2012 21:34   Dg Chest Port 1 View  12/22/2012   CLINICAL DATA:  Altered mental status. Weakness.  EXAM: PORTABLE CHEST - 1 VIEW  COMPARISON:  06/16/2012  FINDINGS: Low lung volumes noted. Heart size within normal limits. Both lungs are clear. Dual lead transvenous pacemaker remains in appropriate position.  IMPRESSION: No acute findings.   Electronically Signed   By: Myles Rosenthal M.D.   On: 12/22/2012 20:17    Scheduled Meds: . ampicillin (OMNIPEN) IV  2 g Intravenous Q4H  . antiseptic oral rinse  15 mL Mouth Rinse BID  . cefTRIAXone (ROCEPHIN)  IV  2 g Intravenous Q12H  . cholecalciferol  1,000 Units Oral Daily  . finasteride  5 mg Oral QHS  . memantine  10 mg Oral BID  . nystatin cream  1 application Topical BID  . pantoprazole  40 mg Oral Daily  . rivastigmine  9.5 mg Transdermal Daily  . sertraline  25 mg Oral Daily  . simvastatin  40 mg Oral  QPM  . sodium chloride  3 mL Intravenous Q12H  . tamsulosin  0.4 mg Oral QPC supper  . traZODone  25 mg Oral QHS  . vancomycin  750 mg Intravenous Q12H  . vitamin B-12  1,000 mcg Oral Daily   Continuous Infusions: . sodium chloride 75 mL/hr at 12/23/12 0130  . sodium chloride 75 mL/hr at 12/24/12 1455    Principal Problem:   Fever Active Problems:   Anemia   Acute renal failure   Dementia    Time spent: > 35 minutes    Charles Lambert  [facility] Pager [phone]. If 7PM-7AM, please contact night-coverage at [email or web address], password [facility] 12/24/2012, 4:07 PM  LOS: 2 days

## 2012-12-25 ENCOUNTER — Inpatient Hospital Stay (HOSPITAL_COMMUNITY): Payer: Medicare Other

## 2012-12-25 DIAGNOSIS — E876 Hypokalemia: Secondary | ICD-10-CM

## 2012-12-25 DIAGNOSIS — R4182 Altered mental status, unspecified: Secondary | ICD-10-CM | POA: Diagnosis present

## 2012-12-25 DIAGNOSIS — I4891 Unspecified atrial fibrillation: Secondary | ICD-10-CM

## 2012-12-25 LAB — GLUCOSE, CAPILLARY: Glucose-Capillary: 88 mg/dL (ref 70–99)

## 2012-12-25 LAB — CULTURE, BLOOD (ROUTINE X 2)

## 2012-12-25 LAB — BASIC METABOLIC PANEL
BUN: 18 mg/dL (ref 6–23)
CO2: 26 mEq/L (ref 19–32)
Chloride: 103 mEq/L (ref 96–112)
Creatinine, Ser: 1.39 mg/dL — ABNORMAL HIGH (ref 0.50–1.35)
GFR calc Af Amer: 52 mL/min — ABNORMAL LOW (ref >90)
Sodium: 139 mEq/L (ref 135–145)

## 2012-12-25 LAB — CSF CELL COUNT WITH DIFFERENTIAL
Eosinophils, CSF: NONE SEEN % (ref 0–1)
Segmented Neutrophils-CSF: NONE SEEN % (ref 0–6)
Tube #: 3

## 2012-12-25 LAB — GRAM STAIN

## 2012-12-25 LAB — PROTEIN AND GLUCOSE, CSF
Glucose, CSF: 57 mg/dL (ref 43–76)
Total  Protein, CSF: 152 mg/dL — ABNORMAL HIGH (ref 15–45)

## 2012-12-25 MED ORDER — LORAZEPAM 2 MG/ML IJ SOLN
0.5000 mg | Freq: Once | INTRAMUSCULAR | Status: AC
  Administered 2012-12-25: 0.5 mg via INTRAVENOUS
  Filled 2012-12-25: qty 1

## 2012-12-25 MED ORDER — ALBUTEROL SULFATE (5 MG/ML) 0.5% IN NEBU
2.5000 mg | INHALATION_SOLUTION | RESPIRATORY_TRACT | Status: DC | PRN
  Administered 2012-12-26 – 2012-12-28 (×5): 2.5 mg via RESPIRATORY_TRACT
  Filled 2012-12-25 (×6): qty 0.5

## 2012-12-25 MED ORDER — LORAZEPAM 2 MG/ML IJ SOLN
1.0000 mg | Freq: Once | INTRAMUSCULAR | Status: AC | PRN
  Administered 2012-12-25: 1 mg via INTRAVENOUS
  Filled 2012-12-25: qty 1

## 2012-12-25 MED ORDER — POTASSIUM CHLORIDE CRYS ER 20 MEQ PO TBCR
40.0000 meq | EXTENDED_RELEASE_TABLET | Freq: Once | ORAL | Status: AC
  Administered 2012-12-25: 40 meq via ORAL
  Filled 2012-12-25: qty 2

## 2012-12-25 NOTE — Progress Notes (Signed)
Pt having expiratory wheezing with activity intermittently  Suctioned with small ammout of whitish sputum noted HOB elevated and RT notified to reassess pt and give treatment .  No acute distrss at this time .  RNWill continue to monitor .

## 2012-12-25 NOTE — Progress Notes (Signed)
[facility] PROGRESS NOTE  ODYN TURKO ZOX:[id number] DOB: 28-Dec-1927 DOA: 12/22/2012 PCP: [name], MD  Brief Narrative:  79 year old male with past medical history of alzheimer's dementia, from nursing home, BPH, dyslipidemia who presented to [facility] ED from SNF for worsening mental status changes for past 1 day prior to this admission. Patient is not a good historian due to dementia and family is not at the bedside at this time to provide the details of medical history. In ED, BP is 143/56, HR 73 and T 102.29F. WBC count was WNL. BMP revealed creatinine of 1.37. CT head did not reveal acute intracranial findings. Pt was given 1 dose of empiric vanco and zosyn in ED.   Assessment/Plan: 1. Fever with acute encephalopathy with suspicion of meningitis  - UA and CXR negative. - max temp 99.6 - Neurology on board.  - LR under fluoroscopy today 10/16-   CSF culture pending  - Per neurology they do not recommend changes in broad spectrum antibiotics (Amp, Rocephin, and Vanc) as patient    LOC has improved .    2. Bacteremia - Growing Gram positive cocci in one bottle.  May represent contamination - Currently on Ampicillin, Rocephin, and Vancomycin - source unknown. CSF culture pending - PCR pending   3. Anemia  - hemoglobin stable at 12 on 12/24/2012 -  No signs of  active bleeding.  4. Acute renal failure  - creatinine 1.39 - will continue to monitor  5. Dementia  - continue namenda  6. Hypokalemia - will replace with 40 meq of potassium - BMP in am  - check magnesium levels  Code Status: Full Family Communication: Discussed with Kathlene November (his son) Disposition Plan: Pending further work up and evaluation  Consultants:  Neurology  Procedures:  LP pending  Antibiotics:  As listed above.  HPI/Subjective: No new complaints. Pt denies headache or neck pain. Pt oriented to self and place. Pt follows commands.  Pt to have LP later today.   Objective: Filed  Vitals:   12/25/12 1244  BP: 154/84  Pulse: 61  Temp: 98.3 F (36.8 C)  Resp: 18    Intake/Output Summary (Last 24 hours) at 12/25/12 1409 Last data filed at 12/24/12 1950  Gross per 24 hour  Intake 3268.75 ml  Output    750 ml  Net 2518.75 ml   Filed Weights   12/23/12 1703 12/24/12 0500 12/25/12 0500  Weight: 87.6 kg (193 lb 2 oz) 89.4 kg (197 lb 1.5 oz) 96 kg (211 lb 10.3 oz)    Exam:   General:  Pt in NAD, drowsy but easy to arouse and answers questions.  Cardiovascular:  S1 and S2 with no MRG. Radial and pedal pulses 2+ with no cyanosis or edema  Respiratory:   Regular unlabored breathing .  Lungs CTA BL, no wheezes  Abdomen: soft, NT, ND. BS x 4  Musculoskeletal: warm and dry and moves all extremities.   Data Reviewed: Basic Metabolic Panel:  Recent Labs Lab 12/22/12 1955 12/23/12 0359 12/24/12 0608 12/25/12 0530  NA 139 139 142 139  K 3.6 3.8 3.5 3.2*  CL 106 105 108 103  CO2 25 27 26 26   GLUCOSE 112* 115* 94 96  BUN 20 18 19 18   CREATININE 1.37* 1.36* 1.24 1.39*  CALCIUM 8.8 9.1 8.0* 8.0*   Liver Function Tests:  Recent Labs Lab 12/23/12 0359  AST 22  ALT 10  ALKPHOS 43  BILITOT 0.7  PROT 5.8*  ALBUMIN 3.1*  No results found for this basename: LIPASE, AMYLASE,  in the last 168 hours No results found for this basename: AMMONIA,  in the last 168 hours CBC:  Recent Labs Lab 12/22/12 1955 12/23/12 0359 12/24/12 0608  WBC 7.1 4.7 7.2  NEUTROABS 6.3  --   --   HGB 12.1* 12.6* 12.0*  HCT 36.4* 38.4* 35.1*  MCV 93.3 93.9 91.4  PLT 176 157 137*   Cardiac Enzymes: No results found for this basename: CKTOTAL, CKMB, CKMBINDEX, TROPONINI,  in the last 168 hours BNP (last 3 results) No results found for this basename: PROBNP,  in the last 8760 hours CBG:  Recent Labs Lab 12/23/12 0747 12/24/12 0638 12/25/12 0713  GLUCAP 106* 102* 88    Recent Results (from the past 240 hour(s))  CULTURE, BLOOD (ROUTINE X 2)     Status: None    Collection Time    12/22/12  7:55 PM      Result Value Range Status   Specimen Description BLOOD RIGHT HAND   Final   Special Requests BOTTLES DRAWN AEROBIC AND ANAEROBIC   Final   Culture  Setup Time     Final   Value: 12/23/2012 02:13     Performed at [facility]   Culture     Final   Value:        BLOOD CULTURE RECEIVED NO GROWTH TO DATE CULTURE WILL BE HELD FOR 5 DAYS BEFORE ISSUING A FINAL NEGATIVE REPORT     Performed at [facility]   Report Status PENDING   Incomplete  CULTURE, BLOOD (ROUTINE X 2)     Status: None   Collection Time    12/22/12  8:00 PM      Result Value Range Status   Specimen Description BLOOD RIGHT ARM   Final   Special Requests BOTTLES DRAWN AEROBIC AND ANAEROBIC   Final   Culture  Setup Time     Final   Value: 12/23/2012 02:13     Performed at [facility]   Culture     Final   Value: MICROCOCCUS SPECIES     Note: Standardized susceptibility testing for this organism is not available.     Note: Gram Stain Report Called to,Read Back By and Verified With: ANGELITO BY INGRAM A 12/24/12 1145AM     Performed at [facility]   Report Status 12/25/2012 FINAL   Final  URINE CULTURE     Status: None   Collection Time    12/22/12  8:34 PM      Result Value Range Status   Specimen Description URINE, CATHETERIZED   Final   Special Requests NONE   Final   Culture  Setup Time     Final   Value: 12/23/2012 00:29     Performed at [facility] Count     Final   Value: NO GROWTH     Performed at [facility]   Culture     Final   Value: NO GROWTH     Performed at [facility]   Report Status 12/23/2012 FINAL   Final  MRSA PCR SCREENING     Status: None   Collection Time    12/23/12  1:50 AM      Result Value Range Status   MRSA by PCR NEGATIVE  NEGATIVE Final   Comment:            The GeneXpert MRSA Assay (FDA  approved for NASAL specimens     only), is one component of a      comprehensive MRSA colonization     surveillance program. It is not     intended to diagnose MRSA     infection nor to guide or     monitor treatment for     MRSA infections.  GRAM STAIN     Status: None   Collection Time    12/25/12 10:29 AM      Result Value Range Status   Specimen Description CSF   Final   Special Requests 3.0ML   Final   Gram Stain     Final   Value: CYTOSPIN SLIDE:     NO WBC SEEN     NO ORGANISMS SEEN   Report Status 12/25/2012 FINAL   Final     Studies: Ct Head Wo Contrast  12/24/2012   CLINICAL DATA:  Confusion.  EXAM: CT HEAD WITHOUT CONTRAST  TECHNIQUE: Contiguous axial images were obtained from the base of the skull through the vertex without intravenous contrast.  COMPARISON:  12/22/2012 and 06/16/2012.  FINDINGS: Prior for burrhole procedure for drainage of left-sided subdural hematoma as per history provided.  No intracranial hemorrhage.  Small vessel disease type changes without CT evidence of large acute infarct.  Global atrophy without hydrocephalus.  No intracranial mass lesion noted on this unenhanced exam.  Vascular calcifications.  IMPRESSION: No acute abnormality. Please see above.   Electronically Signed   By: Bridgett Larsson M.D.   On: 12/24/2012 12:54   Dg Fluoro Guide Lumbar Puncture  12/25/2012   CLINICAL DATA:  Fever. Altered mental status.  EXAM: DIAGNOSTIC LUMBAR PUNCTURE UNDER FLUOROSCOPIC GUIDANCE  FLUOROSCOPY TIME:  45 seconds.  PROCEDURE: Informed consent was obtained from the patient prior to the procedure, including potential complications of headache, allergy, and pain. With the patient prone, the lower back was prepped with Betadine. 1% Lidocaine was used for local anesthesia. Lumbar puncture was performed at the L3-4 level using a gauge needle with return of clear CSF with an opening pressure of 21 cm water. 11.55ml of CSF were obtained for laboratory studies. The patient tolerated the procedure well and there were no apparent  complications.  IMPRESSION: Successful lumbar puncture as above.   Electronically Signed   By: Drusilla Kanner M.D.   On: 12/25/2012 11:31    Scheduled Meds: . ampicillin (OMNIPEN) IV  2 g Intravenous Q4H  . antiseptic oral rinse  15 mL Mouth Rinse BID  . cefTRIAXone (ROCEPHIN)  IV  2 g Intravenous Q12H  . cholecalciferol  1,000 Units Oral Daily  . finasteride  5 mg Oral QHS  . memantine  10 mg Oral BID  . nystatin cream  1 application Topical BID  . pantoprazole  40 mg Oral Daily  . rivastigmine  9.5 mg Transdermal Daily  . sertraline  25 mg Oral Daily  . simvastatin  40 mg Oral QPM  . sodium chloride  3 mL Intravenous Q12H  . tamsulosin  0.4 mg Oral QPC supper  . traZODone  25 mg Oral QHS  . vancomycin  750 mg Intravenous Q12H  . vitamin B-12  1,000 mcg Oral Daily   Continuous Infusions: . sodium chloride 75 mL/hr at 12/23/12 0130  . sodium chloride 75 mL/hr at 12/24/12 1455    Principal Problem:   Fever Active Problems:   Anemia   Acute renal failure   Dementia   Bacteremia   Altered mental status  Time spent: > 35 minutes    Charles Lambert  [facility] Pager [phone]. If 7PM-7AM, please contact night-coverage at [email or web address], password [facility] 12/25/2012, 2:09 PM  LOS: 3 days

## 2012-12-25 NOTE — Progress Notes (Signed)
Subjective: No complaints, including no headache and no neck pain. Patient has not had lumbar puncture under fluoroscopy. Maximum temperature over the past 24 hours was 99.6.  Objective: Current vital signs: BP 153/68  Pulse 60  Temp(Src) 99.6 F (37.6 C) (Oral)  Resp 18  Ht 5\' 10"  (1.778 m)  Wt 96 kg (211 lb 10.3 oz)  BMI 30.37 kg/m2  SpO2 98%  Neurologic Exam: Somewhat drowsy but easy to arouse. Patient was disoriented to time and to a lesser extent place (has known dementia). Able to follow commands well without difficulty. Pupils were equal and reacted normally to light. Extraocular movements were full and conjugate. Visual fields were intact and normal. No facial weakness noted. Patient moved extremities equally with normal strength throughout. There were no signs of meningeal irritation.  Medications: I have reviewed the patient's current medications.  Assessment/Plan: No clinical signs of acute meningitis at this point. CNS infection with favorable response to current antibiotic management cannot be ruled out. Patient's level of alertness has improved. Mental status changes most likely secondary to a combination of underlying dementia and current infectious process.  Lumbar puncture is planned for later today, to be done on the fluoroscopy. Recommend no changes in current antibiotic management. We will continue to follow this patient with you.  C.R. Roseanne Reno, MD [facility] [phone] 12/25/2012  10:29 AM

## 2012-12-26 ENCOUNTER — Inpatient Hospital Stay (HOSPITAL_COMMUNITY): Payer: Medicare Other

## 2012-12-26 DIAGNOSIS — M436 Torticollis: Secondary | ICD-10-CM

## 2012-12-26 DIAGNOSIS — R400 Somnolence: Secondary | ICD-10-CM | POA: Diagnosis present

## 2012-12-26 LAB — HERPES SIMPLEX VIRUS(HSV) DNA BY PCR
HSV 1 DNA: NOT DETECTED
HSV 2 DNA: NOT DETECTED

## 2012-12-26 LAB — VANCOMYCIN, TROUGH: Vancomycin Tr: 15 ug/mL (ref 10.0–20.0)

## 2012-12-26 LAB — BLOOD GAS, ARTERIAL
Acid-Base Excess: 1.6 mmol/L (ref 0.0–2.0)
Drawn by: 225631
O2 Content: 2 L/min
O2 Saturation: 93.5 %
Patient temperature: 98.6
TCO2: 26.9 mmol/L (ref 0–100)
pH, Arterial: 7.419 (ref 7.350–7.450)

## 2012-12-26 LAB — BASIC METABOLIC PANEL
CO2: 25 mEq/L (ref 19–32)
Calcium: 8 mg/dL — ABNORMAL LOW (ref 8.4–10.5)
Chloride: 105 mEq/L (ref 96–112)
GFR calc Af Amer: 69 mL/min — ABNORMAL LOW (ref >90)
Sodium: 138 mEq/L (ref 135–145)

## 2012-12-26 LAB — MAGNESIUM: Magnesium: 1.8 mg/dL (ref 1.5–2.5)

## 2012-12-26 LAB — GLUCOSE, CAPILLARY: Glucose-Capillary: 111 mg/dL — ABNORMAL HIGH (ref 70–99)

## 2012-12-26 MED ORDER — FLUMAZENIL 0.5 MG/5ML IV SOLN
0.2000 mg | Freq: Once | INTRAVENOUS | Status: AC
  Administered 2012-12-26: 0.2 mg via INTRAVENOUS

## 2012-12-26 MED ORDER — FLUMAZENIL 0.5 MG/5ML IV SOLN
0.2000 mg | Freq: Once | INTRAVENOUS | Status: AC
  Administered 2012-12-26: 0.2 mg via INTRAVENOUS
  Filled 2012-12-26: qty 5

## 2012-12-26 MED ORDER — FLUMAZENIL 0.5 MG/5ML IV SOLN
INTRAVENOUS | Status: AC
  Filled 2012-12-26: qty 5

## 2012-12-26 NOTE — Progress Notes (Signed)
Physical Therapy Treatment Patient Details Name: Charles Lambert MRN: [id number] DOB: 10-Nov-1927 Today's Date: 12/26/2012 Time: 2130-8657 PT Time Calculation (min): 24 min  PT Assessment / Plan / Recommendation  History of Present Illness pt presents with AMS and r/o meningitis.     PT Comments   Pt with increased lethargy today and per RN pt was given Ativan overnight.  Once pt in sitting position he had episodes of coughing and very rattley, wheezy respirations.  Encouraged more sitting time with family and RN.  Will continue to follow.    Follow Up Recommendations  SNF     Does the patient have the potential to tolerate intense rehabilitation     Barriers to Discharge        Equipment Recommendations  None recommended by PT    Recommendations for Other Services    Frequency Min 3X/week   Progress towards PT Goals Progress towards PT goals: Progressing toward goals  Plan Current plan remains appropriate    Precautions / Restrictions Precautions Precautions: Fall Restrictions Weight Bearing Restrictions: No   Pertinent Vitals/Pain Did not indicate pain.      Mobility  Bed Mobility Bed Mobility: Supine to Sit;Sitting - Scoot to Edge of Bed Supine to Sit: 1: +2 Total assist Supine to Sit: Patient Percentage: 10% Sitting - Scoot to Edge of Bed: 1: +2 Total assist Sitting - Scoot to Edge of Bed: Patient Percentage: 0% Details for Bed Mobility Assistance: pt with increased lethergy today requiring increased A.   Transfers Transfers: Sit to Stand;Stand to Sit;Stand Pivot Transfers Sit to Stand: 1: +2 Total assist;From bed Sit to Stand: Patient Percentage: 20% Stand to Sit: 1: +2 Total assist;To chair/3-in-1 Stand to Sit: Patient Percentage: 20% Stand Pivot Transfers: 1: +2 Total assist Stand Pivot Transfers: Patient Percentage: 10% Details for Transfer Assistance: pt with decreased ability to A with transfer today 2/2 lethergy.   Ambulation/Gait Ambulation/Gait  Assistance: Not tested (comment) Stairs: No Wheelchair Mobility Wheelchair Mobility: No    Exercises     PT Diagnosis:    PT Problem List:   PT Treatment Interventions:     PT Goals (current goals can now be found in the care plan section) Acute Rehab PT Goals Patient Stated Goal: None stated.   Time For Goal Achievement: 01/07/13 Potential to Achieve Goals: Fair  Visit Information  Last PT Received On: 12/26/12 Assistance Needed: +2 History of Present Illness: pt presents with AMS and r/o meningitis.      Subjective Data  Patient Stated Goal: None stated.     Cognition  Cognition Arousal/Alertness: Lethargic Behavior During Therapy: Flat affect Overall Cognitive Status: Difficult to assess Difficult to assess due to: Level of arousal    Balance  Balance Balance Assessed: Yes Static Sitting Balance Static Sitting - Balance Support: Bilateral upper extremity supported;Feet supported Static Sitting - Level of Assistance: 3: Mod assist Static Sitting - Comment/# of Minutes: At best pt MinA for maintaining balance.    End of Session PT - End of Session Equipment Utilized During Treatment: Gait belt Activity Tolerance: Patient limited by lethargy Patient left: in chair;with call bell/phone within reach;with family/visitor present Nurse Communication: Mobility status   GP     Sunny Schlein, [place] [phone] 12/26/2012, 2:05 PM

## 2012-12-26 NOTE — Progress Notes (Signed)
ANTIBIOTIC CONSULT NOTE   Pharmacy Consult for Vancomycin,  Ceftriaxone Indication: Fever/possible meningitis  No Known Allergies  Patient Measurements: Height: 5\' 10"  (177.8 cm) Weight: 200 lb 9.9 oz (91 kg) IBW/kg (Calculated) : 73 Adjusted Body Weight:   Vital Signs: Temp: 97.6 F (36.4 C) (10/17 1020) BP: 153/73 mmHg (10/17 1020) Pulse Rate: 60 (10/17 1020) Intake/Output from previous day: 10/16 0701 - 10/17 0700 In: 990 [P.O.:390; I.V.:600] Out: -   Labs:  Recent Labs  12/24/12 0608 12/25/12 0530 12/26/12 0455  WBC 7.2  --   --   HGB 12.0*  --   --   PLT 137*  --   --   CREATININE 1.24 1.39* 1.11   Estimated Creatinine Clearance: 57.2 ml/min (by C-G formula based on Cr of 1.11). No results found for this basename: VANCOTROUGH, Leodis Binet, VANCORANDOM, GENTTROUGH, GENTPEAK, GENTRANDOM, TOBRATROUGH, TOBRAPEAK, TOBRARND, AMIKACINPEAK, AMIKACINTROU, AMIKACIN,  in the last 72 hours   Microbiology: Recent Results (from the past 720 hour(s))  CULTURE, BLOOD (ROUTINE X 2)     Status: None   Collection Time    12/22/12  7:55 PM      Result Value Range Status   Specimen Description BLOOD RIGHT HAND   Final   Special Requests BOTTLES DRAWN AEROBIC AND ANAEROBIC   Final   Culture  Setup Time     Final   Value: 12/23/2012 02:13     Performed at [facility]   Culture     Final   Value:        BLOOD CULTURE RECEIVED NO GROWTH TO DATE CULTURE WILL BE HELD FOR 5 DAYS BEFORE ISSUING A FINAL NEGATIVE REPORT     Performed at [facility]   Report Status PENDING   Incomplete  CULTURE, BLOOD (ROUTINE X 2)     Status: None   Collection Time    12/22/12  8:00 PM      Result Value Range Status   Specimen Description BLOOD RIGHT ARM   Final   Special Requests BOTTLES DRAWN AEROBIC AND ANAEROBIC   Final   Culture  Setup Time     Final   Value: 12/23/2012 02:13     Performed at [facility]   Culture     Final   Value: MICROCOCCUS SPECIES   Note: Standardized susceptibility testing for this organism is not available.     Note: Gram Stain Report Called to,Read Back By and Verified With: ANGELITO BY INGRAM A 12/24/12 1145AM     Performed at [facility]   Report Status 12/25/2012 FINAL   Final  URINE CULTURE     Status: None   Collection Time    12/22/12  8:34 PM      Result Value Range Status   Specimen Description URINE, CATHETERIZED   Final   Special Requests NONE   Final   Culture  Setup Time     Final   Value: 12/23/2012 00:29     Performed at [facility]   Colony Count     Final   Value: NO GROWTH     Performed at [facility]   Culture     Final   Value: NO GROWTH     Performed at [facility]   Report Status 12/23/2012 FINAL   Final  MRSA PCR SCREENING     Status: None   Collection Time    12/23/12  1:50 AM  Result Value Range Status   MRSA by PCR NEGATIVE  NEGATIVE Final   Comment:            The GeneXpert MRSA Assay (FDA     approved for NASAL specimens     only), is one component of a     comprehensive MRSA colonization     surveillance program. It is not     intended to diagnose MRSA     infection nor to guide or     monitor treatment for     MRSA infections.  CSF CULTURE     Status: None   Collection Time    12/25/12 10:29 AM      Result Value Range Status   Specimen Description CSF   Final   Special Requests 3.0ML   Final   Gram Stain     Final   Value: NO WBC SEEN     NO ORGANISMS SEEN     CYTOSPIN Performed at [facility]     Performed at [facility]   Culture     Final   Value: NO GROWTH 1 DAY     Performed at [facility]   Report Status PENDING   Incomplete  GRAM STAIN     Status: None   Collection Time    12/25/12 10:29 AM      Result Value Range Status   Specimen Description CSF   Final   Special Requests 3.0ML   Final   Gram Stain     Final   Value: CYTOSPIN SLIDE:     NO WBC SEEN     NO ORGANISMS SEEN   Report  Status 12/25/2012 FINAL   Final    Medical History: Past Medical History  Diagnosis Date  . Dementia   . Subdural hematoma   . Prostate enlargement   . Pacemaker   . Sinoatrial node dysfunction     Medications:  Anti-infectives   Start     Dose/Rate Route Frequency Ordered Stop   12/23/12 1800  vancomycin (VANCOCIN) IVPB 750 mg/150 ml premix     750 mg 150 mL/hr over 60 Minutes Intravenous Every 12 hours 12/23/12 0540     12/23/12 1200  cefTRIAXone (ROCEPHIN) 2 g in dextrose 5 % 50 mL IVPB     2 g 100 mL/hr over 30 Minutes Intravenous Every 12 hours 12/23/12 1206     12/23/12 1000  cefTRIAXone (ROCEPHIN) 2 g in dextrose 5 % 50 mL IVPB  Status:  Discontinued     2 g 100 mL/hr over 30 Minutes Intravenous Every 24 hours 12/23/12 0906 12/23/12 1206   12/23/12 0930  ampicillin (OMNIPEN) 2 g in sodium chloride 0.9 % 50 mL IVPB  Status:  Discontinued     2 g 150 mL/hr over 20 Minutes Intravenous 6 times per day 12/23/12 0906 12/26/12 1408   12/23/12 0015  vancomycin (VANCOCIN) IVPB 1000 mg/200 mL premix     1,000 mg 200 mL/hr over 60 Minutes Intravenous  Once 12/23/12 0008 12/23/12 0346   12/22/12 2200  cefTRIAXone (ROCEPHIN) 1 g in dextrose 5 % 50 mL IVPB     1 g 100 mL/hr over 30 Minutes Intravenous  Once 12/22/12 2158 12/22/12 2338     Assessment: 79 yo M admitted 10/13 with fever, confusion, meningismus.  Pharmacy is asked to dose Vancomycin and ceftriaxone for r/o meningitis. Patient currently afebrille with normal wbc count. Culture data below. ID has stopped ampicillin but recommends  to continue vancomycin and ceftriaxone for a 14 day course. Will check trough tonight.  10/13 BCx2>>1/2 micrococcus (likely contaminant) 10/13 UrineCx>>neg 10/14 MRSA PCR>>neg 10/16 CSF cx>>ngtd  10/16 CSF analysis: mostly normal   Goal of Therapy:  Vancomycin trough level 15-20 mcg/ml Appropriate abx dosing, eradication of infection.   Plan:   Ceftriaxone to 2g IV q12h  Continue  vancomycin 750mg  IV q12h  Measure Vanc trough tonight 12/26/2012.  Follow up renal fxn and culture results.  Sheppard Coil PharmD., BCPS Clinical Pharmacist Pager [phone] 12/26/2012 2:24 PM

## 2012-12-26 NOTE — H&P (Addendum)
[facility]     Reason for Consult:fever    Referring Physician: Dr. Cheri Fowler  Principal Problem:   Fever Active Problems:   Anemia   Acute renal failure   Dementia   Bacteremia   Altered mental status   Hypokalemia   . ampicillin (OMNIPEN) IV  2 g Intravenous Q4H  . antiseptic oral rinse  15 mL Mouth Rinse BID  . cefTRIAXone (ROCEPHIN)  IV  2 g Intravenous Q12H  . cholecalciferol  1,000 Units Oral Daily  . finasteride  5 mg Oral QHS  . memantine  10 mg Oral BID  . nystatin cream  1 application Topical BID  . pantoprazole  40 mg Oral Daily  . rivastigmine  9.5 mg Transdermal Daily  . sertraline  25 mg Oral Daily  . simvastatin  40 mg Oral QPM  . sodium chloride  3 mL Intravenous Q12H  . tamsulosin  0.4 mg Oral QPC supper  . vancomycin  750 mg Intravenous Q12H  . vitamin B-12  1,000 mcg Oral Daily    Recommendations: Continue with vancomycin and ceftriaxone for 14 days total I will stop ampicillin  D/c droplet isolation  Assessment: He has had fever, a couple of WBCs on LP (after 2 days of antibiotics), neck stiffness, AMS, concerning for encephalitis vs meningitis.  HSV is negative.  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   I have no other source of delirium or infection.  I discussed with son he may not return to his previous baseline due to possible meningitis.  I doubt listeria with no known contacts so will d/c ampicillin.    Thanks for the consult, I will follow up as needed.  Dr. Daiva Eves available over the weekend if needed.    Antibiotics: Vancomycin, ceftriaxone, ampicillin day 3  HPI: Charles Lambert is a 79 y.o. male with dementia at the memory unit at NH, who was in his usual state of health until 3 days ago when he was notably confused, no short term memory.  Also complained of headache, neck stiffness.  Fever to 102, CSF done after 48 hours with 2 WBCs, elevated protein.  Still remains  sedate, though received ativan and trazadone last night.  No other meds identified as new.  Fever resolved after starting antibiotics.  UA normal.  No further complaint of headache, neck stiffness.  Also reported to have photophobia initially.     Review of Systems: unable to obtain from patient  Past Medical History  Diagnosis Date  . Dementia   . Subdural hematoma   . Prostate enlargement   . Pacemaker   . Sinoatrial node dysfunction     History  Substance Use Topics  . Smoking status: Never Smoker   . Smokeless tobacco: Not on file  . Alcohol Use: No    Family History  Problem Relation Age of Onset  . Hypertension Mother   . Hypertension Father    No Known Allergies  OBJECTIVE: Blood pressure 153/73, pulse 60, temperature 97.6 F (36.4 C), temperature source Oral, resp. rate 20, height 5\' 10"  (1.778 m), weight 200 lb 9.9 oz (91 kg), SpO2 99.00%. General: not alert, in chair Skin: no rashes Lungs: cta Cor: rrr Abdomen: soft, nt Ext: no edema Neuro: does not respond, unable to assess otherwise  Microbiology: Recent Results (from the past 240 hour(s))  CULTURE, BLOOD (ROUTINE X 2)     Status: None  Collection Time    12/22/12  7:55 PM      Result Value Range Status   Specimen Description BLOOD RIGHT HAND   Final   Special Requests BOTTLES DRAWN AEROBIC AND ANAEROBIC   Final   Culture  Setup Time     Final   Value: 12/23/2012 02:13     Performed at [facility]   Culture     Final   Value:        BLOOD CULTURE RECEIVED NO GROWTH TO DATE CULTURE WILL BE HELD FOR 5 DAYS BEFORE ISSUING A FINAL NEGATIVE REPORT     Performed at [facility]   Report Status PENDING   Incomplete  CULTURE, BLOOD (ROUTINE X 2)     Status: None   Collection Time    12/22/12  8:00 PM      Result Value Range Status   Specimen Description BLOOD RIGHT ARM   Final   Special Requests BOTTLES DRAWN AEROBIC AND ANAEROBIC   Final   Culture  Setup Time     Final    Value: 12/23/2012 02:13     Performed at [facility]   Culture     Final   Value: MICROCOCCUS SPECIES     Note: Standardized susceptibility testing for this organism is not available.     Note: Gram Stain Report Called to,Read Back By and Verified With: ANGELITO BY INGRAM A 12/24/12 1145AM     Performed at [facility]   Report Status 12/25/2012 FINAL   Final  URINE CULTURE     Status: None   Collection Time    12/22/12  8:34 PM      Result Value Range Status   Specimen Description URINE, CATHETERIZED   Final   Special Requests NONE   Final   Culture  Setup Time     Final   Value: 12/23/2012 00:29     Performed at [facility] Count     Final   Value: NO GROWTH     Performed at [facility]   Culture     Final   Value: NO GROWTH     Performed at [facility]   Report Status 12/23/2012 FINAL   Final  MRSA PCR SCREENING     Status: None   Collection Time    12/23/12  1:50 AM      Result Value Range Status   MRSA by PCR NEGATIVE  NEGATIVE Final   Comment:            The GeneXpert MRSA Assay (FDA     approved for NASAL specimens     only), is one component of a     comprehensive MRSA colonization     surveillance program. It is not     intended to diagnose MRSA     infection nor to guide or     monitor treatment for     MRSA infections.  CSF CULTURE     Status: None   Collection Time    12/25/12 10:29 AM      Result Value Range Status   Specimen Description CSF   Final   Special Requests 3.0ML   Final   Gram Stain     Final   Value: NO WBC SEEN     NO ORGANISMS SEEN     CYTOSPIN Performed at [facility]     Performed at [facility]  Culture     Final   Value: NO GROWTH 1 DAY     Performed at [facility]   Report Status PENDING   Incomplete  GRAM STAIN     Status: None   Collection Time    12/25/12 10:29 AM      Result Value Range Status   Specimen Description CSF   Final   Special  Requests 3.0ML   Final   Gram Stain     Final   Value: CYTOSPIN SLIDE:     NO WBC SEEN     NO ORGANISMS SEEN   Report Status 12/25/2012 FINAL   Final    Mackenzee Becvar, Molly Maduro, MD [facility] [email or web address] [phone] pager  [phone] cell 12/26/2012, 2:06 PM

## 2012-12-26 NOTE — Progress Notes (Signed)
ANTIBIOTIC CONSULT NOTE   Pharmacy Consult for Vancomycin,  Ceftriaxone Indication: Fever/possible meningitis  No Known Allergies  Patient Measurements: Height: 5\' 10"  (177.8 cm) Weight: 200 lb 9.9 oz (91 kg) IBW/kg (Calculated) : 73 Adjusted Body Weight:   Vital Signs: Temp: 98.5 F (36.9 C) (10/17 1621) Temp src: Oral (10/17 1621) BP: 154/73 mmHg (10/17 1737) Pulse Rate: 60 (10/17 1737) Intake/Output from previous day: 10/16 0701 - 10/17 0700 In: 990 [P.O.:390; I.V.:600] Out: -   Labs:  Recent Labs  12/24/12 0608 12/25/12 0530 12/26/12 0455  WBC 7.2  --   --   HGB 12.0*  --   --   PLT 137*  --   --   CREATININE 1.24 1.39* 1.11   Estimated Creatinine Clearance: 57.2 ml/min (by C-G formula based on Cr of 1.11).  Recent Labs  12/26/12 1711  VANCOTROUGH 15.0     Microbiology: Recent Results (from the past 720 hour(s))  CULTURE, BLOOD (ROUTINE X 2)     Status: None   Collection Time    12/22/12  7:55 PM      Result Value Range Status   Specimen Description BLOOD RIGHT HAND   Final   Special Requests BOTTLES DRAWN AEROBIC AND ANAEROBIC   Final   Culture  Setup Time     Final   Value: 12/23/2012 02:13     Performed at [facility]   Culture     Final   Value:        BLOOD CULTURE RECEIVED NO GROWTH TO DATE CULTURE WILL BE HELD FOR 5 DAYS BEFORE ISSUING A FINAL NEGATIVE REPORT     Performed at [facility]   Report Status PENDING   Incomplete  CULTURE, BLOOD (ROUTINE X 2)     Status: None   Collection Time    12/22/12  8:00 PM      Result Value Range Status   Specimen Description BLOOD RIGHT ARM   Final   Special Requests BOTTLES DRAWN AEROBIC AND ANAEROBIC   Final   Culture  Setup Time     Final   Value: 12/23/2012 02:13     Performed at [facility]   Culture     Final   Value: MICROCOCCUS SPECIES     Note: Standardized susceptibility testing for this organism is not available.     Note: Gram Stain Report Called  to,Read Back By and Verified With: ANGELITO BY INGRAM A 12/24/12 1145AM     Performed at [facility]   Report Status 12/25/2012 FINAL   Final  URINE CULTURE     Status: None   Collection Time    12/22/12  8:34 PM      Result Value Range Status   Specimen Description URINE, CATHETERIZED   Final   Special Requests NONE   Final   Culture  Setup Time     Final   Value: 12/23/2012 00:29     Performed at [facility]   Colony Count     Final   Value: NO GROWTH     Performed at [facility]   Culture     Final   Value: NO GROWTH     Performed at [facility]   Report Status 12/23/2012 FINAL   Final  MRSA PCR SCREENING     Status: None   Collection Time    12/23/12  1:50 AM      Result Value Range Status  MRSA by PCR NEGATIVE  NEGATIVE Final   Comment:            The GeneXpert MRSA Assay (FDA     approved for NASAL specimens     only), is one component of a     comprehensive MRSA colonization     surveillance program. It is not     intended to diagnose MRSA     infection nor to guide or     monitor treatment for     MRSA infections.  CSF CULTURE     Status: None   Collection Time    12/25/12 10:29 AM      Result Value Range Status   Specimen Description CSF   Final   Special Requests 3.0ML   Final   Gram Stain     Final   Value: NO WBC SEEN     NO ORGANISMS SEEN     CYTOSPIN Performed at [facility]     Performed at [facility]   Culture     Final   Value: NO GROWTH 1 DAY     Performed at [facility]   Report Status PENDING   Incomplete  GRAM STAIN     Status: None   Collection Time    12/25/12 10:29 AM      Result Value Range Status   Specimen Description CSF   Final   Special Requests 3.0ML   Final   Gram Stain     Final   Value: CYTOSPIN SLIDE:     NO WBC SEEN     NO ORGANISMS SEEN   Report Status 12/25/2012 FINAL   Final    Medical History: Past Medical History  Diagnosis Date  . Dementia   .  Subdural hematoma   . Prostate enlargement   . Pacemaker   . Sinoatrial node dysfunction     Medications:  Anti-infectives   Start     Dose/Rate Route Frequency Ordered Stop   12/23/12 1800  vancomycin (VANCOCIN) IVPB 750 mg/150 ml premix     750 mg 150 mL/hr over 60 Minutes Intravenous Every 12 hours 12/23/12 0540     12/23/12 1200  cefTRIAXone (ROCEPHIN) 2 g in dextrose 5 % 50 mL IVPB     2 g 100 mL/hr over 30 Minutes Intravenous Every 12 hours 12/23/12 1206     12/23/12 1000  cefTRIAXone (ROCEPHIN) 2 g in dextrose 5 % 50 mL IVPB  Status:  Discontinued     2 g 100 mL/hr over 30 Minutes Intravenous Every 24 hours 12/23/12 0906 12/23/12 1206   12/23/12 0930  ampicillin (OMNIPEN) 2 g in sodium chloride 0.9 % 50 mL IVPB  Status:  Discontinued     2 g 150 mL/hr over 20 Minutes Intravenous 6 times per day 12/23/12 0906 12/26/12 1408   12/23/12 0015  vancomycin (VANCOCIN) IVPB 1000 mg/200 mL premix     1,000 mg 200 mL/hr over 60 Minutes Intravenous  Once 12/23/12 0008 12/23/12 0346   12/22/12 2200  cefTRIAXone (ROCEPHIN) 1 g in dextrose 5 % 50 mL IVPB     1 g 100 mL/hr over 30 Minutes Intravenous  Once 12/22/12 2158 12/22/12 2338     Assessment: 79 yo M admitted 10/13 with fever, confusion, meningismus. Patient currently afebrille with normal wbc count.  ID has stopped ampicillin but recommends to continue vancomycin and ceftriaxone for a 14 day course.  Patients renal function improved since yesterday from Scr 1.39>  1.11. Vanc trough returned therapeutic.   10/17 @ 5:11 VT: 15  Goal of Therapy:  Vancomycin trough level 15-20 mcg/ml Appropriate abx dosing, eradication of infection.   Plan:                                            Continue vancomycin 750mg  IV q12h  Continue Ceftriaxone to 2g IV q12h  Follow up renal fxn, if continues to improve check another VT tomorrow.   Wallis Vancott M. Allena Katz, PharmD Clinical Pharmacist- Resident Pager: [phone] Pharmacy:  [phone] 12/26/2012 6:31 PM

## 2012-12-26 NOTE — Clinical Social Work Psychosocial (Signed)
Clinical Social Work Department BRIEF PSYCHOSOCIAL ASSESSMENT 12/26/2012  Patient:  Charles, Lambert     Account Number:  [id number]     Admit date:  12/22/2012  Clinical Social Worker:  Delmer Islam  Date/Time:  12/26/2012 08:04 AM  Referred by:  Physician  Date Referred:  12/25/2012 Referred for  SNF Placement   Other Referral:   Interview type:  Family Other interview type:   CSW talked with patient's son Charles Lambert [phone])    PSYCHOSOCIAL DATA Living Status:  ALONE Admitted from facility:   Level of care:   Primary support name:  Charles Lambert Primary support relationship to patient:  CHILD, ADULT Degree of support available:   Patient has another son, Charles. Lambert's phone number is [phone].    CURRENT CONCERNS Current Concerns  Post-Acute Placement   Other Concerns:    SOCIAL WORK ASSESSMENT / PLAN CSW talked with son regarding recommendation of ST rehab. Patient is from [facility], but needs rehab in a skilled facility and this was discussed with son. CSW explained facility search process, was given facility list and questions answered. Son in agreement with ST rehab and will assist in choosing a facility for patient.   Assessment/plan status:  Psychosocial Support/Ongoing Assessment of Needs Other assessment/ plan:   Information/referral to community resources:   Son given Horticulturist, commercial of SNF's for [employer]    PATIENT'S/FAMILY'S RESPONSE TO PLAN OF CARE: Son open and receptive to talking with CSW about ST rehab for patient. Son informed that he will updated with facility responses.

## 2012-12-26 NOTE — Progress Notes (Signed)
Pt. Remains lethargic with intermittent response to pain. Also, has wheezing which has not improved since neb tx. Sats 99%. Pt. Skin dusky as compared to earlier. Dr. Cena Benton notified; new orders received.

## 2012-12-26 NOTE — Clinical Social Work Placement (Signed)
Clinical Social Work Department CLINICAL SOCIAL WORK PLACEMENT NOTE 12/26/2012  Patient:  Charles Lambert, Charles Lambert  Account Number:  [id number] Admit date:  12/22/2012  Clinical Social Worker:  Genelle Bal, LCSW  Date/time:  12/26/2012 08:18 AM  Clinical Social Work is seeking post-discharge placement for this patient at the following level of care:   SKILLED NURSING   (*CSW will update this form in Epic as items are completed)   12/26/2012  Patient/family provided with [facility] Department of Clinical Social Work's list of facilities offering this level of care within the geographic area requested by the patient (or if unable, by the patient's family).  12/26/2012  Patient/family informed of their freedom to choose among providers that offer the needed level of care, that participate in Medicare, Medicaid or managed care program needed by the patient, have an available bed and are willing to accept the patient.    Patient/family informed of [facility]' ownership interest in [facility], as well as of the fact that they are under no obligation to receive care at this facility.  PASARR submitted to EDS on  PASARR number received from EDS on   FL2 transmitted to all facilities in geographic area requested by pt/family on   FL2 transmitted to all facilities within larger geographic area on   Patient informed that his/her managed care company has contracts with or will negotiate with  certain facilities, including the following:     Patient/family informed of bed offers received:   Patient chooses bed at  Physician recommends and patient chooses bed at    Patient to be transferred to  on   Patient to be transferred to facility by   The following physician request were entered in Epic:   Additional Comments:

## 2012-12-26 NOTE — Progress Notes (Signed)
BIPAP unit not available at bedtime.  Unit located and placed in patients room this a.m.

## 2012-12-26 NOTE — Progress Notes (Signed)
Patient was found to have lot of wheezing on assessment, on 2 ltrs of oxygen. Pt is very restless and trying to be OOB and pulling all the tubes. Also  Patient's son told nurse that since last 4 months pt wear a BIPAP at night. No order available. Notified Callahan/NP. Prn orders received, See MAR. Ativan given for agitation, RRT notified for breathing treatment and BIPAP.

## 2012-12-26 NOTE — Progress Notes (Signed)
Pt. With increase in alertness; now intermittently talking with sons. Dr. Cena Benton notified. Will monitor.

## 2012-12-26 NOTE — H&P (Signed)
Charles Lambert is an 79 y.o. male.  Plan: Continue empiric treatment for bacterial meningitis on vancomycin, ampicillin and ceftriaxone until CSF cultures come back  Assessment: Encephalitis  vs meningitis vs exacerbated dementia  HPI  The patient was not alert or oriented and did not respond to verbal commands. His son says he was admitted to [facility] on 10/13 with fever, confusion and agitation and was then shifted to [facility] the next day. He was unable to comment further on photosensitivity, headache, the grade of the fever but says he did not have any neck stiffness. On 10/13 He was started on vancomycin, ceftriaxone and ampicillin after which a LP was done on 10/16.  His past medical history is significant for dementia over the past couple of years but his son states his level of confusion is greater than baseline. He had a dental implant  3 weeks ago. A pacemaker was put in 4 years ago.   PMH: head trauma/subdural hematoma (7 years ago), demential, postate enlargement, pacemaker inserted FHx: mother and father: HTN Social history: unremarkable Allergies: none   Review of Systems  Unable to perform ROS: mental status change    Blood pressure 153/73, pulse 60, temperature 97.6 F (36.4 C), temperature source Oral, resp. rate 20, height 5\' 10"  (1.778 m), weight 91 kg (200 lb 9.9 oz), SpO2 99.00%.  Vital Signs Report       10/16 0700 10/17 0659 10/17 0700 10/17 1346   Most Recent        Temp (F) 97.4 -  98.3 97.6 -  97.6  97.6 (36.4)  10/17 1020    Pulse 61 -  61 60 -  60  60  10/17 1020    Resp 18 -  20              BP 148/78 -  161/74 153/73 -  153/73  153/73  10/17 1020    SpO2 (%) 93 -  100 99 -  99  99  10/17 1020      Physical Exam  Cardiovascular: Normal rate, regular rhythm and normal heart sounds.   Respiratory: Effort normal. He has wheezes.  GI: Soft. Bowel sounds are normal.   Neuro: altered mental status, not alert or oriented All  other systemic examinations unremarkable   Selected Labs (Up to last 3 results from past 72 hours) Report       10/15 0608   10/16 0530   10/17 0455      WBC 7.2         RBC 3.84          Hemoglobin 12.0          HCT 35.1          Platelets 137          Sodium 142  139  138     Potassium 3.5  3.2   4.8     Chloride 108  103  105     CO2 26  26  25      BUN 19  18  11      Creatinine 1.24  1.39   1.11     Calcium 8.0   8.0   8.0      Glucose 94  96  99     10/15 CT head IMPRESSION:  No acute abnormality. Please see above.  Herpes simplex virus(hsv) dna by pcr [[id number]] Collected: 12/25/12 1029 Updated: 12/26/12 1216 Specimen Type: Cerebrospinal  Fluid Specimen source hsv CSF HSV 1 DNA Not Detected HSV 2 DNA Not Detected CSF culture [[id number]] Collected: 12/25/12 1029 Updated: 12/26/12 0708 Specimen Type: Cerebrospinal Fluid Specimen Description CSF Special Requests 3.0ML Gram Stain - Result: NO WBC SEEN NO ORGANISMS SEEN CYTOSPIN Performed at [facility] Performed at [facility] Culture - Result: NO GROWTH 1 DAY Performed at [facility] Report Status PENDING  Gram stain - STAT with CSF culture [[id number]] Collected: 12/25/12 1029 Updated: 12/25/12 1216 Specimen Type: Cerebrospinal Fluid Specimen Description CSF Special Requests 3.0ML Gram Stain - Result: CYTOSPIN SLIDE: NO WBC SEEN NO ORGANISMS SEEN Report Status 12/25/2012 FINAL  CSF cell count with differential [[id number]] (Abnormal) Collected: 12/25/12 1029 Updated: 12/25/12 1214 Specimen Type: Cerebrospinal Fluid Tube # 3 Color, CSF COLORLESS Appearance, CSF CLEAR Supernatant NOT INDICATED RBC Count, CSF 1 (H) /cu mm WBC, CSF 2 /cu mm Segmented Neutrophils-CSF NONE SEEN % Lymphs, CSF FEW % Monocyte-Macrophage-Spinal Fluid FEW % Eosinophils, CSF NONE SEEN % Other Cells, CSF TOO FEW TO COUNT, SMEAR AVAILABLE FOR REVIEW  Culture, blood (routine x 2) [[id number]] Collected: 12/22/12 2000 Updated: 12/25/12  1025 Specimen Type: Blood Specimen Description BLOOD RIGHT ARM Special Requests BOTTLES DRAWN AEROBIC AND ANAEROBIC Culture Setup Time - Result: 12/23/2012 02:13 Performed at [facility] Culture - Result: MICROCOCCUS SPECIES Note: Standardized susceptibility testing for this organism is not available. Note: Gram Stain Report Called to,Read Back By and Verified With: ANGELITO BY INGRAM A 12/24/12 1145AM Performed at [facility] Report Status 12/25/2012 FINAL   Urine culture [[id number]]   Collected: 12/22/12 2034   Updated: 12/23/12 2129   Specimen Type: Urine   Specimen Source: Urine, Catheterized    Specimen Description URINE, CATHETERIZED    Special Requests NONE    Culture Setup Time -    Result:     12/23/2012 00:29 Performed at [facility] Count -    Result:     NO GROWTH Performed at [facility]    Culture -    Result:     NO GROWTH Performed at [facility]    Report Status 12/23/2012 FINAL      Dg Chest Port 1 View  12/22/2012 IMPRESSION: No acute findings. Electronically Signed By: Myles Rosenthal M.D. On: 12/22/2012 20:17     Otilio Carpen 12/26/2012, 1:29 PM

## 2012-12-26 NOTE — Progress Notes (Signed)
[facility] PROGRESS NOTE  Charles Lambert ZOX:[id number] DOB: [birth year] DOA: 12/22/2012 PCP: [name], MD  Brief Narrative:  79 year old male with past medical history of alzheimer's dementia, from nursing home, BPH, dyslipidemia who presented to [facility] ED from SNF for worsening mental status changes for past 1 day prior to this admission. Patient is not a good historian due to dementia and family is not at the bedside at this time to provide the details of medical history. In ED, BP is 143/56, HR 73 and T 102.49F. WBC count was WNL. BMP revealed creatinine of 1.37. CT head did not reveal acute intracranial findings. Pt was given 1 dose of empiric vanco and zosyn in ED.  Was given trazodone and ativan last night and patient today has been less responsive.     Assessment/Plan: 1. Fever with acute encephalopathy with suspicion of meningitis  - UA and CXR negative. - max temp 99.6 - Neurology on board.  - LR under fluoroscopy today 10/16-   CSF culture pending  - Consulted Infectious Disease for further evaluation and recommendations. - patient last night was given Ativan and trazodone around 10:30 PM. Subsequently patient throughout the course of today has had a change in mental status manifested as increase in somnolence with limited response to examiner. After dose of flumazenil patient's mental status improved as such will cancel CT of head. - would avoid any Ativan or any other benzodiazepine for agitation.also will discontinue trazodone  2. Bacteremia - Growing Gram positive cocci in one bottle.  Consult with infectious disease at this point most likely contaminant - Currently on Ampicillin, Rocephin, and Vancomycin - CSF culture reporting no growth x1 day - HSV 1 and 2 DNA not detected  3. Anemia  - hemoglobin stable at 12 on 12/24/2012 -  No signs of  active bleeding.  4. Acute renal failure  - resolving creatinine within normal limits - will continue to monitor  5.  Dementia  - continue namenda  6. Hypokalemia - resolved after oral replacement - BMP in am  - check magnesium levels  Code Status: Full Family Communication: Discussed with Kathlene November (his son) Disposition Plan: discuss with neurologist  Consultants:  Neurology  Procedures:  LP pending  Antibiotics:  As listed above.  HPI/Subjective: As mentioned above patient had change in mental status manifested as decreased responsiveness and hypersomnolence. Reportedly patient is more interactive after flumazenil administration  Objective: Filed Vitals:   12/26/12 1737  BP: 154/73  Pulse: 60  Temp:   Resp:     Intake/Output Summary (Last 24 hours) at 12/26/12 1746 Last data filed at 12/25/12 1821  Gross per 24 hour  Intake    840 ml  Output      0 ml  Net    840 ml   Filed Weights   12/24/12 0500 12/25/12 0500 12/26/12 0500  Weight: 89.4 kg (197 lb 1.5 oz) 96 kg (211 lb 10.3 oz) 91 kg (200 lb 9.9 oz)    Exam:   General:  Patient resting in bed and in chair. Somnolent but would respond to noxious stimuli  Cardiovascular:  S1 and S2 with no MRG. Radial and pedal pulses 2+ with no cyanosis or edema  Respiratory:   Regular unlabored breathing .  Lungs CTA BL, no wheezes  Abdomen: soft, NT, ND. BS x 4  Musculoskeletal: warm and dry and moves all extremities.   Data Reviewed: Basic Metabolic Panel:  Recent Labs Lab 12/22/12 1955 12/23/12 0359 12/24/12 8119  12/25/12 0530 12/26/12 0455  NA 139 139 142 139 138  K 3.6 3.8 3.5 3.2* 4.8  CL 106 105 108 103 105  CO2 25 27 26 26 25   GLUCOSE 112* 115* 94 96 99  BUN 20 18 19 18 11   CREATININE 1.37* 1.36* 1.24 1.39* 1.11  CALCIUM 8.8 9.1 8.0* 8.0* 8.0*  MG  --   --   --   --  1.8   Liver Function Tests:  Recent Labs Lab 12/23/12 0359  AST 22  ALT 10  ALKPHOS 43  BILITOT 0.7  PROT 5.8*  ALBUMIN 3.1*   No results found for this basename: LIPASE, AMYLASE,  in the last 168 hours No results found for this  basename: AMMONIA,  in the last 168 hours CBC:  Recent Labs Lab 12/22/12 1955 12/23/12 0359 12/24/12 0608  WBC 7.1 4.7 7.2  NEUTROABS 6.3  --   --   HGB 12.1* 12.6* 12.0*  HCT 36.4* 38.4* 35.1*  MCV 93.3 93.9 91.4  PLT 176 157 137*   Cardiac Enzymes: No results found for this basename: CKTOTAL, CKMB, CKMBINDEX, TROPONINI,  in the last 168 hours BNP (last 3 results) No results found for this basename: PROBNP,  in the last 8760 hours CBG:  Recent Labs Lab 12/23/12 0747 12/24/12 0638 12/25/12 0713 12/26/12 0634  GLUCAP 106* 102* 88 111*    Recent Results (from the past 240 hour(s))  CULTURE, BLOOD (ROUTINE X 2)     Status: None   Collection Time    12/22/12  7:55 PM      Result Value Range Status   Specimen Description BLOOD RIGHT HAND   Final   Special Requests BOTTLES DRAWN AEROBIC AND ANAEROBIC   Final   Culture  Setup Time     Final   Value: 12/23/2012 02:13     Performed at [facility]   Culture     Final   Value:        BLOOD CULTURE RECEIVED NO GROWTH TO DATE CULTURE WILL BE HELD FOR 5 DAYS BEFORE ISSUING A FINAL NEGATIVE REPORT     Performed at [facility]   Report Status PENDING   Incomplete  CULTURE, BLOOD (ROUTINE X 2)     Status: None   Collection Time    12/22/12  8:00 PM      Result Value Range Status   Specimen Description BLOOD RIGHT ARM   Final   Special Requests BOTTLES DRAWN AEROBIC AND ANAEROBIC   Final   Culture  Setup Time     Final   Value: 12/23/2012 02:13     Performed at [facility]   Culture     Final   Value: MICROCOCCUS SPECIES     Note: Standardized susceptibility testing for this organism is not available.     Note: Gram Stain Report Called to,Read Back By and Verified With: ANGELITO BY INGRAM A 12/24/12 1145AM     Performed at [facility]   Report Status 12/25/2012 FINAL   Final  URINE CULTURE     Status: None   Collection Time    12/22/12  8:34 PM      Result Value Range Status    Specimen Description URINE, CATHETERIZED   Final   Special Requests NONE   Final   Culture  Setup Time     Final   Value: 12/23/2012 00:29     Performed at [facility]  Colony Count     Final   Value: NO GROWTH     Performed at [facility]   Culture     Final   Value: NO GROWTH     Performed at [facility]   Report Status 12/23/2012 FINAL   Final  MRSA PCR SCREENING     Status: None   Collection Time    12/23/12  1:50 AM      Result Value Range Status   MRSA by PCR NEGATIVE  NEGATIVE Final   Comment:            The GeneXpert MRSA Assay (FDA     approved for NASAL specimens     only), is one component of a     comprehensive MRSA colonization     surveillance program. It is not     intended to diagnose MRSA     infection nor to guide or     monitor treatment for     MRSA infections.  CSF CULTURE     Status: None   Collection Time    12/25/12 10:29 AM      Result Value Range Status   Specimen Description CSF   Final   Special Requests 3.0ML   Final   Gram Stain     Final   Value: NO WBC SEEN     NO ORGANISMS SEEN     CYTOSPIN Performed at [facility]     Performed at [facility]   Culture     Final   Value: NO GROWTH 1 DAY     Performed at [facility]   Report Status PENDING   Incomplete  GRAM STAIN     Status: None   Collection Time    12/25/12 10:29 AM      Result Value Range Status   Specimen Description CSF   Final   Special Requests 3.0ML   Final   Gram Stain     Final   Value: CYTOSPIN SLIDE:     NO WBC SEEN     NO ORGANISMS SEEN   Report Status 12/25/2012 FINAL   Final     Studies: Dg Fluoro Guide Lumbar Puncture  12/25/2012   CLINICAL DATA:  Fever. Altered mental status.  EXAM: DIAGNOSTIC LUMBAR PUNCTURE UNDER FLUOROSCOPIC GUIDANCE  FLUOROSCOPY TIME:  45 seconds.  PROCEDURE: Informed consent was obtained from the patient prior to the procedure, including potential complications of headache, allergy,  and pain. With the patient prone, the lower back was prepped with Betadine. 1% Lidocaine was used for local anesthesia. Lumbar puncture was performed at the L3-4 level using a gauge needle with return of clear CSF with an opening pressure of 21 cm water. 11.70ml of CSF were obtained for laboratory studies. The patient tolerated the procedure well and there were no apparent complications.  IMPRESSION: Successful lumbar puncture as above.   Electronically Signed   By: Drusilla Kanner M.D.   On: 12/25/2012 11:31    Scheduled Meds: . antiseptic oral rinse  15 mL Mouth Rinse BID  . cefTRIAXone (ROCEPHIN)  IV  2 g Intravenous Q12H  . cholecalciferol  1,000 Units Oral Daily  . finasteride  5 mg Oral QHS  . memantine  10 mg Oral BID  . nystatin cream  1 application Topical BID  . pantoprazole  40 mg Oral Daily  . rivastigmine  9.5 mg Transdermal Daily  . sertraline  25 mg Oral Daily  .  simvastatin  40 mg Oral QPM  . sodium chloride  3 mL Intravenous Q12H  . tamsulosin  0.4 mg Oral QPC supper  . vancomycin  750 mg Intravenous Q12H  . vitamin B-12  1,000 mcg Oral Daily   Continuous Infusions: . sodium chloride 75 mL/hr at 12/23/12 0130  . sodium chloride 75 mL/hr at 12/26/12 4540    Principal Problem:   Fever Active Problems:   Anemia   Acute renal failure   Dementia   Bacteremia   Altered mental status   Hypokalemia    Time spent: > 35 minutes    Penny Pia  [facility] Pager [phone]. If 7PM-7AM, please contact night-coverage at [email or web address], password [facility] 12/26/2012, 5:46 PM  LOS: 4 days

## 2012-12-26 NOTE — Progress Notes (Signed)
Pt. Remains with decreased responsiveness; Dr. Cena Benton and Felicie Morn, PA notified; will monitor.

## 2012-12-26 NOTE — Progress Notes (Signed)
NEURO HOSPITALIST PROGRESS NOTE   SUBJECTIVE:                                                                                                                         Patient is very drowsy, will withdrawal all extremities to pain stating "ouch", will squeeze my hands but will not open his eyes.  When I try to open his eyes he forcefully shuts them.  Will not answer any questions when asked about neck pain of photophobia.  OBJECTIVE:                                                                                                                           Vital signs in last 24 hours: Temp:  [97.4 F (36.3 C)-98.3 F (36.8 C)] 97.6 F (36.4 C) (10/17 1020) Pulse Rate:  [60-61] 60 (10/17 1020) Resp:  [18-20] 20 (10/16 2200) BP: (148-161)/(73-84) 153/73 mmHg (10/17 1020) SpO2:  [93 %-100 %] 99 % (10/17 1020) Weight:  [91 kg (200 lb 9.9 oz)] 91 kg (200 lb 9.9 oz) (10/17 0500)  Intake/Output from previous day: 10/16 0701 - 10/17 0700 In: 990 [P.O.:390; I.V.:600] Out: -  Intake/Output this shift:   Nutritional status: Cardiac  Past Medical History  Diagnosis Date  . Dementia   . Subdural hematoma   . Prostate enlargement   . Pacemaker   . Sinoatrial node dysfunction      Neurologic Exam:  Mental Status: drowsy, minimal speech --one word speech -"ouch" follows no commands.  Cranial Nerves: II: no blink to threat but exam is limited due to patient forcefully shutting his eyes.  normal, pupils equal, round, reactive to light and accommodation III,IV, VI: dolls intact V,VII: smile asymmetric on the right, facial light touch sensation normal bilaterally VIII: hearing normal bilaterally IX,X: gag reflex present XI: bilateral shoulder shrug --continue to hold neck in one position but shows no pain when I try to manipulate his neck.  Motor: Withdraws all extremities antigravity but could not formally test strength.  Sensory: Pinprick and light  touch intact throughout, bilaterally Deep Tendon Reflexes:  Right: Upper Extremity   Left: Upper extremity   biceps (C-5 to C-6) 2/4   biceps (C-5 to C-6) 2/4 tricep (C7)  2/4    triceps (C7) 2/4 Brachioradialis (C6) 2/4  Brachioradialis (C6) 2/4  Lower Extremity Lower Extremity  quadriceps (L-2 to L-4) 1/4   quadriceps (L-2 to L-4) 1/4 Achilles (S1) 0/4   Achilles (S1) 0/4  Plantars: Right: downgoing   Left: downgoing    Lab Results: No results found for this basename: cbc, bmp, coags, chol, tri, ldl, hga1c   Lipid Panel No results found for this basename: CHOL, TRIG, HDL, CHOLHDL, VLDL, LDLCALC,  in the last 72 hours  Studies/Results: Ct Head Wo Contrast  12/24/2012   CLINICAL DATA:  Confusion.  EXAM: CT HEAD WITHOUT CONTRAST  TECHNIQUE: Contiguous axial images were obtained from the base of the skull through the vertex without intravenous contrast.  COMPARISON:  12/22/2012 and 06/16/2012.  FINDINGS: Prior for burrhole procedure for drainage of left-sided subdural hematoma as per history provided.  No intracranial hemorrhage.  Small vessel disease type changes without CT evidence of large acute infarct.  Global atrophy without hydrocephalus.  No intracranial mass lesion noted on this unenhanced exam.  Vascular calcifications.  IMPRESSION: No acute abnormality. Please see above.   Electronically Signed   By: Bridgett Larsson M.D.   On: 12/24/2012 12:54   Dg Fluoro Guide Lumbar Puncture  12/25/2012   CLINICAL DATA:  Fever. Altered mental status.  EXAM: DIAGNOSTIC LUMBAR PUNCTURE UNDER FLUOROSCOPIC GUIDANCE  FLUOROSCOPY TIME:  45 seconds.  PROCEDURE: Informed consent was obtained from the patient prior to the procedure, including potential complications of headache, allergy, and pain. With the patient prone, the lower back was prepped with Betadine. 1% Lidocaine was used for local anesthesia. Lumbar puncture was performed at the L3-4 level using a gauge needle with return of clear CSF with an  opening pressure of 21 cm water. 11.27ml of CSF were obtained for laboratory studies. The patient tolerated the procedure well and there were no apparent complications.  IMPRESSION: Successful lumbar puncture as above.   Electronically Signed   By: Drusilla Kanner M.D.   On: 12/25/2012 11:31    MEDICATIONS                                                                                                                        Scheduled: . ampicillin (OMNIPEN) IV  2 g Intravenous Q4H  . antiseptic oral rinse  15 mL Mouth Rinse BID  . cefTRIAXone (ROCEPHIN)  IV  2 g Intravenous Q12H  . cholecalciferol  1,000 Units Oral Daily  . finasteride  5 mg Oral QHS  . memantine  10 mg Oral BID  . nystatin cream  1 application Topical BID  . pantoprazole  40 mg Oral Daily  . rivastigmine  9.5 mg Transdermal Daily  . sertraline  25 mg Oral Daily  . simvastatin  40 mg Oral QPM  . sodium chloride  3 mL Intravenous Q12H  . tamsulosin  0.4 mg Oral QPC supper  . traZODone  25 mg Oral QHS  . vancomycin  750 mg  Intravenous Q12H  . vitamin B-12  1,000 mcg Oral Daily    ASSESSMENT/PLAN:                                                                                                           Continue to be drowsy.  LP performed. Glucose 57, protein 157, RBC 1, seg, monocytes, color normal,  Both culture and stain were normal. HSV PCR (P). Mental status changes most likely secondary to a combination of underlying dementia and possible infectious process currently being treated by ABX.   Continues to be on AMP, VANC and Rocephin (day 4)  No further neurology recommendation.   Assessment and plan discussed with with attending physician and they are in agreement.    Felicie Morn PA-C [facility] [phone]  12/26/2012, 10:41 AM

## 2012-12-27 DIAGNOSIS — R404 Transient alteration of awareness: Secondary | ICD-10-CM

## 2012-12-27 LAB — GLUCOSE, CAPILLARY

## 2012-12-27 NOTE — Progress Notes (Signed)
Subjective: Patient had no new complaints. Family indicates he is not having problems with sedation today and was alert earlier morning prior to arrival. Still has slight confusion and has known dementia. He still not quite back to baseline, however.  Objective: Current vital signs: BP 175/86  Pulse 67  Temp(Src) 98.2 F (36.8 C) (Oral)  Resp 18  Ht 5\' 10"  (1.778 m)  Wt 91 kg (200 lb 9.9 oz)  BMI 28.79 kg/m2  SpO2 99%  Neurologic Exam: Patient was initially somnolent but was easy to arouse, and stayed alert throughout the entire evaluation. He was oriented to person and for the most part place. He was disoriented to time, including his correct age. Pupils were equal and normal. Extraocular movements were full conjugate. No facial weakness was noted. Speech was moderately slurred and somewhat low in volume. Strength of extremities was normal throughout. Muscle tone was normal as well. Coordination with finger to nose testing was normal bilaterally.  Medications: I have reviewed the patient's current medications.  Assessment/Plan: Altered mental status most likely secondary to combination of underlying dementia with exacerbation of cognitive functioning as well as level of responsiveness resulting from acute infectious process. Patient had no indication of CNS infection. Mental status has markedly improved.  Recommend no changes in current management. We will continue to follow this patient with you.  C.R. Roseanne Reno, MD [facility] [phone]  12/27/2012  9:51 AM

## 2012-12-27 NOTE — Progress Notes (Signed)
12/26/12 2357  BiPAP/CPAP/SIPAP  BiPAP/CPAP/SIPAP Pt Type Adult  Mask Type Full face mask  Mask Size Large  IPAP 10 cmH20  EPAP 5 cmH2O  Flow Rate 3 lpm  BiPAP/CPAP/SIPAP BiPAP  Patient Home Equipment No  Auto Titrate No  Patient p;aced on Bipap 10/5 with 3L oxygen bleed in

## 2012-12-27 NOTE — Progress Notes (Signed)
[facility] PROGRESS NOTE  Charles Lambert ZOX:[id number] DOB: 18-Jul-1927 DOA: 12/22/2012 PCP: [name], MD  Brief Narrative:  79 year old male with past medical history of alzheimer's dementia, from nursing home, BPH, dyslipidemia who presented to [facility] ED from SNF for worsening mental status changes for past 1 day prior to this admission. Patient is not a good historian due to dementia and family is not at the bedside at this time to provide the details of medical history. In ED, BP is 143/56, HR 73 and T 102.102F. WBC count was WNL. BMP revealed creatinine of 1.37. CT head did not reveal acute intracranial findings. Pt was given 1 dose of empiric vanco and zosyn in ED.  Was given trazodone and ativan last night and patient today has been less responsive.     Assessment/Plan: 1. Fever with acute encephalopathy with suspicion of meningitis  - UA and CXR negative.  - max temp 99.6 - Neurology on board.  - LR under fluoroscopy today 10/16-  CSF culture pending  - Consulted Infectious Disease for further evaluation and recommendations:  Recommendation in chart and plan is to continue Vanc and Rocephin for 14 days. Today is day 5/14. Consulted Child psychotherapist for disposition planning.  - Agitation recently treated with ativan: suspect this led to hypersomnolence on 12/26/12 - would avoid any Ativan or any other benzodiazepine for agitation also will discontinue trazodone - HSV 1 and 2 DNA not detected - CSF culture reporting no growth x1 day  2. Bacteremia - Growing Gram positive cocci in one bottle.  Consult with infectious disease at this point and per our discussion most likely contaminant  3. Anemia  - hemoglobin stable at 12 on 12/24/2012 -  No signs of  active bleeding.  4. Acute renal failure  - resolving creatinine within normal limits - will continue to monitor  5. Dementia  - continue namenda  6. Hypokalemia - resolved after oral replacement - BMP in am  - check  magnesium levels  Code Status: Full Family Communication: Discussed with Kathlene November (his son) Disposition Plan: discuss with neurologist  Consultants:  Neurology  Procedures:  LP pending  Antibiotics:  As listed above.  HPI/Subjective: Patient is more alert today. Wanted to shake my hand and was thanking me.  No new complaints.  Objective: Filed Vitals:   12/27/12 0545  BP: 175/86  Pulse: 67  Temp: 98.2 F (36.8 C)  Resp: 18    Intake/Output Summary (Last 24 hours) at 12/27/12 0946 Last data filed at 12/27/12 0500  Gross per 24 hour  Intake      0 ml  Output    300 ml  Net   -300 ml   Filed Weights   12/24/12 0500 12/25/12 0500 12/26/12 0500  Weight: 89.4 kg (197 lb 1.5 oz) 96 kg (211 lb 10.3 oz) 91 kg (200 lb 9.9 oz)    Exam:   General:  Patient alert and awake  Cardiovascular:  S1 and S2 with no MRG. Radial and pedal pulses 2+ with no cyanosis or edema  Respiratory:   Regular unlabored breathing .  Lungs CTA BL, no wheezes  Abdomen: soft, NT, ND. BS x 4  Musculoskeletal: warm and dry and moves all extremities.   Data Reviewed: Basic Metabolic Panel:  Recent Labs Lab 12/22/12 1955 12/23/12 0359 12/24/12 0608 12/25/12 0530 12/26/12 0455  NA 139 139 142 139 138  K 3.6 3.8 3.5 3.2* 4.8  CL 106 105 108 103 105  CO2  25 27 26 26 25   GLUCOSE 112* 115* 94 96 99  BUN 20 18 19 18 11   CREATININE 1.37* 1.36* 1.24 1.39* 1.11  CALCIUM 8.8 9.1 8.0* 8.0* 8.0*  MG  --   --   --   --  1.8   Liver Function Tests:  Recent Labs Lab 12/23/12 0359  AST 22  ALT 10  ALKPHOS 43  BILITOT 0.7  PROT 5.8*  ALBUMIN 3.1*   No results found for this basename: LIPASE, AMYLASE,  in the last 168 hours No results found for this basename: AMMONIA,  in the last 168 hours CBC:  Recent Labs Lab 12/22/12 1955 12/23/12 0359 12/24/12 0608  WBC 7.1 4.7 7.2  NEUTROABS 6.3  --   --   HGB 12.1* 12.6* 12.0*  HCT 36.4* 38.4* 35.1*  MCV 93.3 93.9 91.4  PLT 176 157 137*    Cardiac Enzymes: No results found for this basename: CKTOTAL, CKMB, CKMBINDEX, TROPONINI,  in the last 168 hours BNP (last 3 results) No results found for this basename: PROBNP,  in the last 8760 hours CBG:  Recent Labs Lab 12/24/12 0638 12/25/12 0713 12/26/12 0634 12/26/12 1641 12/27/12 0651  GLUCAP 102* 88 111* 86 108*    Recent Results (from the past 240 hour(s))  CULTURE, BLOOD (ROUTINE X 2)     Status: None   Collection Time    12/22/12  7:55 PM      Result Value Range Status   Specimen Description BLOOD RIGHT HAND   Final   Special Requests BOTTLES DRAWN AEROBIC AND ANAEROBIC   Final   Culture  Setup Time     Final   Value: 12/23/2012 02:13     Performed at [facility]   Culture     Final   Value:        BLOOD CULTURE RECEIVED NO GROWTH TO DATE CULTURE WILL BE HELD FOR 5 DAYS BEFORE ISSUING A FINAL NEGATIVE REPORT     Performed at [facility]   Report Status PENDING   Incomplete  CULTURE, BLOOD (ROUTINE X 2)     Status: None   Collection Time    12/22/12  8:00 PM      Result Value Range Status   Specimen Description BLOOD RIGHT ARM   Final   Special Requests BOTTLES DRAWN AEROBIC AND ANAEROBIC   Final   Culture  Setup Time     Final   Value: 12/23/2012 02:13     Performed at [facility]   Culture     Final   Value: MICROCOCCUS SPECIES     Note: Standardized susceptibility testing for this organism is not available.     Note: Gram Stain Report Called to,Read Back By and Verified With: ANGELITO BY INGRAM A 12/24/12 1145AM     Performed at [facility]   Report Status 12/25/2012 FINAL   Final  URINE CULTURE     Status: None   Collection Time    12/22/12  8:34 PM      Result Value Range Status   Specimen Description URINE, CATHETERIZED   Final   Special Requests NONE   Final   Culture  Setup Time     Final   Value: 12/23/2012 00:29     Performed at [facility] Count     Final   Value: NO GROWTH      Performed at [facility]  Final   Value: NO GROWTH     Performed at [facility]   Report Status 12/23/2012 FINAL   Final  MRSA PCR SCREENING     Status: None   Collection Time    12/23/12  1:50 AM      Result Value Range Status   MRSA by PCR NEGATIVE  NEGATIVE Final   Comment:            The GeneXpert MRSA Assay (FDA     approved for NASAL specimens     only), is one component of a     comprehensive MRSA colonization     surveillance program. It is not     intended to diagnose MRSA     infection nor to guide or     monitor treatment for     MRSA infections.  CSF CULTURE     Status: None   Collection Time    12/25/12 10:29 AM      Result Value Range Status   Specimen Description CSF   Final   Special Requests 3.0ML   Final   Gram Stain     Final   Value: NO WBC SEEN     NO ORGANISMS SEEN     CYTOSPIN Performed at [facility]     Performed at [facility]   Culture     Final   Value: NO GROWTH 1 DAY     Performed at [facility]   Report Status PENDING   Incomplete  GRAM STAIN     Status: None   Collection Time    12/25/12 10:29 AM      Result Value Range Status   Specimen Description CSF   Final   Special Requests 3.0ML   Final   Gram Stain     Final   Value: CYTOSPIN SLIDE:     NO WBC SEEN     NO ORGANISMS SEEN   Report Status 12/25/2012 FINAL   Final     Studies: Dg Chest Port 1 View  12/26/2012   CLINICAL DATA:  Cough and congestion.  EXAM: PORTABLE CHEST - 1 VIEW  COMPARISON:  12/22/2012.  FINDINGS: The heart is upper limits of normal and stable. The mediastinal and hilar contours are unchanged. There is tortuosity and calcification of the thoracic aorta. The pacer wires are stable. There are bibasilar infiltrates and small effusions.  IMPRESSION: Bibasilar infiltrates and small effusions.   Electronically Signed   By: Loralie Champagne M.D.   On: 12/26/2012 18:25   Dg Fluoro Guide Lumbar  Puncture  12/25/2012   CLINICAL DATA:  Fever. Altered mental status.  EXAM: DIAGNOSTIC LUMBAR PUNCTURE UNDER FLUOROSCOPIC GUIDANCE  FLUOROSCOPY TIME:  45 seconds.  PROCEDURE: Informed consent was obtained from the patient prior to the procedure, including potential complications of headache, allergy, and pain. With the patient prone, the lower back was prepped with Betadine. 1% Lidocaine was used for local anesthesia. Lumbar puncture was performed at the L3-4 level using a gauge needle with return of clear CSF with an opening pressure of 21 cm water. 11.29ml of CSF were obtained for laboratory studies. The patient tolerated the procedure well and there were no apparent complications.  IMPRESSION: Successful lumbar puncture as above.   Electronically Signed   By: Drusilla Kanner M.D.   On: 12/25/2012 11:31    Scheduled Meds: . antiseptic oral rinse  15 mL Mouth Rinse BID  . cefTRIAXone (ROCEPHIN)  IV  2  g Intravenous Q12H  . cholecalciferol  1,000 Units Oral Daily  . finasteride  5 mg Oral QHS  . memantine  10 mg Oral BID  . nystatin cream  1 application Topical BID  . pantoprazole  40 mg Oral Daily  . rivastigmine  9.5 mg Transdermal Daily  . sertraline  25 mg Oral Daily  . simvastatin  40 mg Oral QPM  . sodium chloride  3 mL Intravenous Q12H  . tamsulosin  0.4 mg Oral QPC supper  . vancomycin  750 mg Intravenous Q12H  . vitamin B-12  1,000 mcg Oral Daily   Continuous Infusions: . sodium chloride 75 mL/hr at 12/23/12 0130  . sodium chloride 1,000 mL (12/27/12 0007)    Principal Problem:   Fever Active Problems:   Anemia   Acute renal failure   Dementia   Bacteremia   Altered mental status   Hypokalemia   Uncontrolled daytime somnolence    Time spent: > 35 minutes    Penny Pia  [facility] Pager [phone]. If 7PM-7AM, please contact night-coverage at [email or web address], password [facility] 12/27/2012, 9:46 AM  LOS: 5 days

## 2012-12-28 ENCOUNTER — Inpatient Hospital Stay (HOSPITAL_COMMUNITY): Payer: Medicare Other

## 2012-12-28 LAB — GLUCOSE, CAPILLARY: Glucose-Capillary: 103 mg/dL — ABNORMAL HIGH (ref 70–99)

## 2012-12-28 LAB — CSF CULTURE W GRAM STAIN: Gram Stain: NONE SEEN

## 2012-12-28 LAB — CSF CULTURE: Culture: NO GROWTH

## 2012-12-28 MED ORDER — SODIUM CHLORIDE 0.9 % IJ SOLN
10.0000 mL | Freq: Two times a day (BID) | INTRAMUSCULAR | Status: DC
  Administered 2012-12-28: 10 mL

## 2012-12-28 MED ORDER — SODIUM CHLORIDE 0.9 % IJ SOLN
10.0000 mL | INTRAMUSCULAR | Status: DC | PRN
  Administered 2012-12-29: 10 mL

## 2012-12-28 NOTE — Progress Notes (Signed)
Pt. Was placed on BIPAP 10/5 via [facility]. After pt. Was placed on CPAP pt. Pulled the mask off & asked for the mask to be put away. Pt. Refuses to wear CPAP at this time.

## 2012-12-28 NOTE — Progress Notes (Signed)
[facility] PROGRESS NOTE  Charles Lambert JXB:[id number] DOB: 1927/03/22 DOA: 12/22/2012 PCP: [name], MD  Brief Narrative:  79 year old male with past medical history of alzheimer's dementia, from nursing home, BPH, dyslipidemia who presented to [facility] ED from SNF for worsening mental status changes for past 1 day prior to this admission. Patient is not a good historian due to dementia and family is not at the bedside at this time to provide the details of medical history. In ED, BP is 143/56, HR 73 and T 102.2F. WBC count was WNL. BMP revealed creatinine of 1.37. CT head did not reveal acute intracranial findings. Pt was given 1 dose of empiric vanco and zosyn in ED.  Was given trazodone and ativan and patient subsequently had hypersomnolence which resolved with discontinuation of medication.    Assessment/Plan: 1. Fever with acute encephalopathy with suspicion of meningitis  - UA and CXR negative.  - max temp 99.6 - Neurology on board.  - LR under fluoroscopy today 10/16-  CSF culture pending  - Consulted Infectious Disease for further evaluation and recommendations:  Recommendation in chart and plan is to continue Vanc and Rocephin for 14 days. Today is day 6/14.  - Consulted Social worker for disposition planning.  - Agitation recently treated with ativan: suspect this led to hypersomnolence on 12/26/12 - would avoid any Ativan or any other benzodiazepine for agitation also will discontinue trazodone - HSV 1 and 2 DNA not detected - CSF culture reporting no growth x1 day  2. Bacteremia - Growing Gram positive cocci in one bottle.  Consult with infectious disease at this point and per our discussion most likely contaminant  3. Anemia  - hemoglobin stable at 12 on 12/24/2012 -  No signs of  active bleeding.  4. Acute renal failure  - resolving creatinine within normal limits - will continue to monitor  5. Dementia  - continue namenda  6. Hypokalemia - resolved  after oral replacement - BMP in am  - check magnesium levels  Code Status: Full Family Communication: Discussed with Kathlene November (his son) Disposition Plan: discuss with neurologist  Consultants:  Neurology  Procedures:  LP   Antibiotics:  As listed above.  HPI/Subjective: Patient is more alert today per son and getting better daily.  Objective: Filed Vitals:   12/28/12 0542  BP: 157/79  Pulse: 60  Temp: 99 F (37.2 C)  Resp: 20    Intake/Output Summary (Last 24 hours) at 12/28/12 0958 Last data filed at 12/27/12 1750  Gross per 24 hour  Intake    220 ml  Output      1 ml  Net    219 ml   Filed Weights   12/25/12 0500 12/26/12 0500 12/28/12 0500  Weight: 96 kg (211 lb 10.3 oz) 91 kg (200 lb 9.9 oz) 96 kg (211 lb 10.3 oz)    Exam:   General:  Patient alert and awake  Cardiovascular:  S1 and S2 with no MRG. Radial and pedal pulses 2+ with no cyanosis or edema  Respiratory:   Regular unlabored breathing .  Lungs CTA BL, no wheezes  Abdomen: soft, NT, ND. BS x 4  Musculoskeletal: warm and dry and moves all extremities.   Data Reviewed: Basic Metabolic Panel:  Recent Labs Lab 12/22/12 1955 12/23/12 0359 12/24/12 0608 12/25/12 0530 12/26/12 0455  NA 139 139 142 139 138  K 3.6 3.8 3.5 3.2* 4.8  CL 106 105 108 103 105  CO2 25 27 26  26 25  GLUCOSE 112* 115* 94 96 99  BUN 20 18 19 18 11   CREATININE 1.37* 1.36* 1.24 1.39* 1.11  CALCIUM 8.8 9.1 8.0* 8.0* 8.0*  MG  --   --   --   --  1.8   Liver Function Tests:  Recent Labs Lab 12/23/12 0359  AST 22  ALT 10  ALKPHOS 43  BILITOT 0.7  PROT 5.8*  ALBUMIN 3.1*   No results found for this basename: LIPASE, AMYLASE,  in the last 168 hours No results found for this basename: AMMONIA,  in the last 168 hours CBC:  Recent Labs Lab 12/22/12 1955 12/23/12 0359 12/24/12 0608  WBC 7.1 4.7 7.2  NEUTROABS 6.3  --   --   HGB 12.1* 12.6* 12.0*  HCT 36.4* 38.4* 35.1*  MCV 93.3 93.9 91.4  PLT 176 157  137*   Cardiac Enzymes: No results found for this basename: CKTOTAL, CKMB, CKMBINDEX, TROPONINI,  in the last 168 hours BNP (last 3 results) No results found for this basename: PROBNP,  in the last 8760 hours CBG:  Recent Labs Lab 12/25/12 0713 12/26/12 0634 12/26/12 1641 12/27/12 0651 12/28/12 0630  GLUCAP 88 111* 86 108* 103*    Recent Results (from the past 240 hour(s))  CULTURE, BLOOD (ROUTINE X 2)     Status: None   Collection Time    12/22/12  7:55 PM      Result Value Range Status   Specimen Description BLOOD RIGHT HAND   Final   Special Requests BOTTLES DRAWN AEROBIC AND ANAEROBIC   Final   Culture  Setup Time     Final   Value: 12/23/2012 02:13     Performed at [facility]   Culture     Final   Value:        BLOOD CULTURE RECEIVED NO GROWTH TO DATE CULTURE WILL BE HELD FOR 5 DAYS BEFORE ISSUING A FINAL NEGATIVE REPORT     Performed at [facility]   Report Status PENDING   Incomplete  CULTURE, BLOOD (ROUTINE X 2)     Status: None   Collection Time    12/22/12  8:00 PM      Result Value Range Status   Specimen Description BLOOD RIGHT ARM   Final   Special Requests BOTTLES DRAWN AEROBIC AND ANAEROBIC   Final   Culture  Setup Time     Final   Value: 12/23/2012 02:13     Performed at [facility]   Culture     Final   Value: MICROCOCCUS SPECIES     Note: Standardized susceptibility testing for this organism is not available.     Note: Gram Stain Report Called to,Read Back By and Verified With: ANGELITO BY INGRAM A 12/24/12 1145AM     Performed at [facility]   Report Status 12/25/2012 FINAL   Final  URINE CULTURE     Status: None   Collection Time    12/22/12  8:34 PM      Result Value Range Status   Specimen Description URINE, CATHETERIZED   Final   Special Requests NONE   Final   Culture  Setup Time     Final   Value: 12/23/2012 00:29     Performed at [facility] Count     Final   Value: NO  GROWTH     Performed at [facility]  Final   Value: NO GROWTH     Performed at [facility]   Report Status 12/23/2012 FINAL   Final  MRSA PCR SCREENING     Status: None   Collection Time    12/23/12  1:50 AM      Result Value Range Status   MRSA by PCR NEGATIVE  NEGATIVE Final   Comment:            The GeneXpert MRSA Assay (FDA     approved for NASAL specimens     only), is one component of a     comprehensive MRSA colonization     surveillance program. It is not     intended to diagnose MRSA     infection nor to guide or     monitor treatment for     MRSA infections.  CSF CULTURE     Status: None   Collection Time    12/25/12 10:29 AM      Result Value Range Status   Specimen Description CSF   Final   Special Requests 3.0ML   Final   Gram Stain     Final   Value: NO WBC SEEN     NO ORGANISMS SEEN     CYTOSPIN Performed at [facility]     Performed at [facility]   Culture     Final   Value: NO GROWTH 2 DAYS     Performed at [facility]   Report Status PENDING   Incomplete  GRAM STAIN     Status: None   Collection Time    12/25/12 10:29 AM      Result Value Range Status   Specimen Description CSF   Final   Special Requests 3.0ML   Final   Gram Stain     Final   Value: CYTOSPIN SLIDE:     NO WBC SEEN     NO ORGANISMS SEEN   Report Status 12/25/2012 FINAL   Final     Studies: Dg Chest Port 1 View  12/26/2012   CLINICAL DATA:  Cough and congestion.  EXAM: PORTABLE CHEST - 1 VIEW  COMPARISON:  12/22/2012.  FINDINGS: The heart is upper limits of normal and stable. The mediastinal and hilar contours are unchanged. There is tortuosity and calcification of the thoracic aorta. The pacer wires are stable. There are bibasilar infiltrates and small effusions.  IMPRESSION: Bibasilar infiltrates and small effusions.   Electronically Signed   By: Loralie Champagne M.D.   On: 12/26/2012 18:25    Scheduled Meds: .  antiseptic oral rinse  15 mL Mouth Rinse BID  . cefTRIAXone (ROCEPHIN)  IV  2 g Intravenous Q12H  . cholecalciferol  1,000 Units Oral Daily  . finasteride  5 mg Oral QHS  . memantine  10 mg Oral BID  . nystatin cream  1 application Topical BID  . pantoprazole  40 mg Oral Daily  . rivastigmine  9.5 mg Transdermal Daily  . sertraline  25 mg Oral Daily  . simvastatin  40 mg Oral QPM  . sodium chloride  3 mL Intravenous Q12H  . tamsulosin  0.4 mg Oral QPC supper  . vancomycin  750 mg Intravenous Q12H  . vitamin B-12  1,000 mcg Oral Daily   Continuous Infusions: . sodium chloride 75 mL/hr at 12/23/12 0130  . sodium chloride 75 mL/hr at 12/28/12 4098    Principal Problem:   Fever Active Problems:   Anemia  Acute renal failure   Dementia   Bacteremia   Altered mental status   Hypokalemia   Uncontrolled daytime somnolence    Time spent: > 35 minutes    Penny Pia  [facility] Pager [phone]. If 7PM-7AM, please contact night-coverage at [email or web address], password [facility] 12/28/2012, 9:58 AM  LOS: 6 days

## 2012-12-28 NOTE — Progress Notes (Signed)
Subjective: Patient has experienced recurrent agitation and has required restraints with mittens.  Objective: Current vital signs: BP 157/79  Pulse 60  Temp(Src) 99 F (37.2 C) (Oral)  Resp 20  Ht 5\' 10"  (1.778 m)  Wt 96 kg (211 lb 10.3 oz)  BMI 30.37 kg/m2  SpO2 95%  Neurologic Exam: Drowsy but arousable. He is able to follow simple commands and knows is in the hospital. Pupils were equal and reacted normally to light. Extraocular movements were full and conjugate on right and left lateral gaze. Face was symmetrical with no focal weakness. Speech was moderately slurred, commensurate with level of alertness. Moved extremities equally with normal strength throughout.  Medications: I have reviewed the patient's current medications.  Assessment/Plan: No significant changes in mental status of the patient is somewhat more drowsy, compared to yesterday. I suspect his mental status changes are secondary to his current infectious process, as well as underlying dementia, which is likely exacerbated by alteration wake-sleep cycle as well is displacement from familiar surroundings. No focal deficits have been noted.  Recommend no changes in current management. I will plan to see him in followup on a when necessary basis after this visit. Please feel free to call for reevaluation if patient's condition changes.  C.R. Roseanne Reno, MD [facility] [phone]  12/28/2012  10:15 AM

## 2012-12-29 ENCOUNTER — Inpatient Hospital Stay (HOSPITAL_COMMUNITY): Payer: Medicare Other

## 2012-12-29 DIAGNOSIS — Z950 Presence of cardiac pacemaker: Secondary | ICD-10-CM

## 2012-12-29 LAB — CULTURE, BLOOD (ROUTINE X 2)

## 2012-12-29 LAB — GLUCOSE, CAPILLARY: Glucose-Capillary: 106 mg/dL — ABNORMAL HIGH (ref 70–99)

## 2012-12-29 MED ORDER — DEXTROSE 5 % IV SOLN: 2.0000 g | Freq: Two times a day (BID) | INTRAVENOUS | Status: DC

## 2012-12-29 MED ORDER — SODIUM CHLORIDE 0.9 % IJ SOLN: 10.0000 mL | INTRAMUSCULAR | Status: DC | PRN

## 2012-12-29 MED ORDER — VANCOMYCIN HCL IN DEXTROSE 750-5 MG/150ML-% IV SOLN: 750.0000 mg | Freq: Two times a day (BID) | INTRAVENOUS | Status: DC

## 2012-12-29 NOTE — Discharge Summary (Addendum)
Physician Discharge Summary  Charles Lambert ZOX:[id number] DOB: 03-18-1927 DOA: 12/22/2012  PCP: [name], MD  Admit date: 12/22/2012 Discharge date: 12/29/2012  Time spent: > 35 minutes   Recommendations for Outpatient Follow-up:  1. Follow up with PCP at SNF for continued monitoring while on antibiotic regimen. 2. Follow up with neurology in 1 month  Discharge Diagnoses:  Principal Problem:   Suspected encephalitis vs meningitis, being treated for meningitis empirically  Active Problems:   Anemia   Acute renal failure   Dementia   Bacteremia   Altered mental status   Hypokalemia   Uncontrolled daytime somnolence   Discharge Condition: stable  Diet recommendation: heart healthy diet   Filed Weights   12/26/12 0500 12/28/12 0500 12/29/12 0500  Weight: 91 kg (200 lb 9.9 oz) 96 kg (211 lb 10.3 oz) 91.7 kg (202 lb 2.6 oz)    History of present illness:  79 year old male with past medical history of alzheimer's dementia, from nursing home, BPH, dyslipidemia who presented to [facility] ED from SNF for worsening mental status changes for past 1 day prior to this admission. Patient is not a good historian due to dementia . In ED, BP is 143/56, HR 73 and T 102.77F. WBC count was WNL. CT head did not reveal acute intracranial findings. Altered mental status most likely secondary to combination of underlying dementia with exacerbation of cognitive functioning  resulting from acute infectious process.    Hospital Course:  1. Fever with acute encephalopathy with suspicion of meningitis  - UA and CXR negative.  - HSV 1 and 2 DNA not detected  - max temp 98.2 12/29/2012 - Neurology. Pt to follow up on an as needed basis  - LR under fluoroscopy 10/16- CSF culture no growth for 3 days - Infectious Disease recommends continuation of vancomycin and ceftriaxone  For  full course.  Today is day 7/14.  - pt to be d/c with PICC line for prolonged antibiotic administration.  Will require routine  vancomycin monitoring and adjustment per SNF pharmacy protocol   2. Bacteremia  - Growing Gram positive cocci in one bottle.  - Consult with infectious disease at this point and per our discussion most likely contaminant    3. Anemia  - hemoglobin stable at 12 on 12/24/2012  - No signs of active bleeding.   4. Acute renal failure  - resolved creatinine within normal limits   5. Dementia  - continue namenda   6. Hypokalemia  - resolved after oral replacement   Procedures:  LP 12/25/2012  Consultations:  Neurology  Infectious disease  Discharge Exam: Filed Vitals:   12/29/12 0950  BP: 108/54  Pulse: 60  Temp: 98.2 F (36.8 C)  Resp: 18    General: Pt in NAD, Alert and Awake Cardiovascular: RRR, no MRG Respiratory: CTA BL no wheezes  Discharge Instructions  Discharge Orders   Future Orders Complete By Expires   Call MD for:  extreme fatigue  As directed    Call MD for:  temperature >100.4  As directed    Diet - low sodium heart healthy  As directed    Discharge instructions  As directed    Comments:     Patient will require physical therapy and speech therapy.  Also needs to complete 1 more week of IV antibiotics.  May discontinue Picc line per SNF protocol once antibiotic therapy complete.  Also routine monitoring and administration or vancomycin per protocol at skilled nursing facility   Increase  activity slowly  As directed        Medication List    STOP taking these medications       traZODone 50 MG tablet  Commonly known as:  DESYREL      TAKE these medications       cholecalciferol 1000 UNITS tablet  Commonly known as:  VITAMIN D  Take 1,000 Units by mouth daily.     dextrose 5 % SOLN 50 mL with cefTRIAXone 2 G SOLR 2 g  Inject 2 g into the vein every 12 (twelve) hours.     finasteride 5 MG tablet  Commonly known as:  PROSCAR  Take 5 mg by mouth at bedtime.     lansoprazole 30 MG capsule  Commonly known as:  PREVACID  Take 30 mg  by mouth daily.     memantine 10 MG tablet  Commonly known as:  NAMENDA  Take 10 mg by mouth 2 (two) times daily.     nystatin cream  Commonly known as:  MYCOSTATIN  Apply 1 application topically 2 (two) times daily.     rivastigmine 9.5 mg/24hr  Commonly known as:  EXELON  Place 1 patch onto the skin daily.     sertraline 25 MG tablet  Commonly known as:  ZOLOFT  Take 25 mg by mouth daily.     simvastatin 40 MG tablet  Commonly known as:  ZOCOR  Take 40 mg by mouth every evening.     tamsulosin 0.4 MG Caps capsule  Commonly known as:  FLOMAX  Take 0.4 mg by mouth daily after supper.     traMADol 50 MG tablet  Commonly known as:  ULTRAM  Take 25 mg by mouth every 6 (six) hours as needed for pain. Take 1/2 tab every 6 hours as needed for pain     Vancomycin 750 MG/150ML Soln  Commonly known as:  VANCOCIN  Inject 150 mLs (750 mg total) into the vein every 12 (twelve) hours.     vitamin B-12 1000 MCG tablet  Commonly known as:  CYANOCOBALAMIN  Take 1,000 mcg by mouth daily.       No Known Allergies    The results of significant diagnostics from this hospitalization (including imaging, microbiology, ancillary and laboratory) are listed below for reference.    Significant Diagnostic Studies: Ct Head Wo Contrast  12/24/2012   CLINICAL DATA:  Confusion.  EXAM: CT HEAD WITHOUT CONTRAST  TECHNIQUE: Contiguous axial images were obtained from the base of the skull through the vertex without intravenous contrast.  COMPARISON:  12/22/2012 and 06/16/2012.  FINDINGS: Prior for burrhole procedure for drainage of left-sided subdural hematoma as per history provided.  No intracranial hemorrhage.  Small vessel disease type changes without CT evidence of large acute infarct.  Global atrophy without hydrocephalus.  No intracranial mass lesion noted on this unenhanced exam.  Vascular calcifications.  IMPRESSION: No acute abnormality. Please see above.   Electronically Signed   By: Bridgett Larsson M.D.   On: 12/24/2012 12:54   Ct Head Wo Contrast  12/22/2012   CLINICAL DATA:  Altered mental status. Fall. Head trauma. Prior subdural hematoma.  EXAM: CT HEAD WITHOUT CONTRAST  TECHNIQUE: Contiguous axial images were obtained from the base of the skull through the vertex without intravenous contrast.  COMPARISON:  06/16/2012  FINDINGS: There is no evidence of intracranial hemorrhage, brain edema, or other signs of acute infarction. There is no evidence of intracranial mass lesion or mass effect. No abnormal extraaxial  fluid collections are identified.  Mild to moderate diffuse cerebral atrophy is stable as well as mild chronic small vessel disease. Ventricles are stable in size. No evidence of acute skull fracture. 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  IMPRESSION: No acute findings.  Stable cerebral atrophy and chronic small vessel disease.   Electronically Signed   By: Myles Rosenthal M.D.   On: 12/22/2012 21:34   Dg Chest Port 1 View  12/28/2012   CLINICAL DATA:  PICC line placement.  EXAM: PORTABLE CHEST - 1 VIEW  COMPARISON:  12/26/2012.  FINDINGS: The right PICC line tip is approximately 3 cm below the region of the cavoatrial junction and in the right atrium. The pacer wires are stable. Stable cardiac enlargement, vascular congestion, bibasilar infiltrates and pleural effusions.  IMPRESSION: The right PICC line tip is in the right atrium approximately 3 cm below the region of the cavoatrial junction.   Electronically Signed   By: Loralie Champagne M.D.   On: 12/28/2012 12:55   Dg Chest Port 1 View  12/26/2012   CLINICAL DATA:  Cough and congestion.  EXAM: PORTABLE CHEST - 1 VIEW  COMPARISON:  12/22/2012.  FINDINGS: The heart is upper limits of normal and stable. The mediastinal and hilar contours are unchanged. There is tortuosity and calcification of the thoracic aorta. The pacer wires are stable. There are bibasilar infiltrates and small effusions.  IMPRESSION: Bibasilar  infiltrates and small effusions.   Electronically Signed   By: Loralie Champagne M.D.   On: 12/26/2012 18:25   Dg Chest Port 1 View  12/22/2012   CLINICAL DATA:  Altered mental status. Weakness.  EXAM: PORTABLE CHEST - 1 VIEW  COMPARISON:  06/16/2012  FINDINGS: Low lung volumes noted. Heart size within normal limits. Both lungs are clear. Dual lead transvenous pacemaker remains in appropriate position.  IMPRESSION: No acute findings.   Electronically Signed   By: Myles Rosenthal M.D.   On: 12/22/2012 20:17   Dg Fluoro Guide Lumbar Puncture  12/25/2012   CLINICAL DATA:  Fever. Altered mental status.  EXAM: DIAGNOSTIC LUMBAR PUNCTURE UNDER FLUOROSCOPIC GUIDANCE  FLUOROSCOPY TIME:  45 seconds.  PROCEDURE: Informed consent was obtained from the patient prior to the procedure, including potential complications of headache, allergy, and pain. With the patient prone, the lower back was prepped with Betadine. 1% Lidocaine was used for local anesthesia. Lumbar puncture was performed at the L3-4 level using a gauge needle with return of clear CSF with an opening pressure of 21 cm water. 11.76ml of CSF were obtained for laboratory studies. The patient tolerated the procedure well and there were no apparent complications.  IMPRESSION: Successful lumbar puncture as above.   Electronically Signed   By: Drusilla Kanner M.D.   On: 12/25/2012 11:31    Microbiology: Recent Results (from the past 240 hour(s))  CULTURE, BLOOD (ROUTINE X 2)     Status: None   Collection Time    12/22/12  7:55 PM      Result Value Range Status   Specimen Description BLOOD RIGHT HAND   Final   Special Requests BOTTLES DRAWN AEROBIC AND ANAEROBIC   Final   Culture  Setup Time     Final   Value: 12/23/2012 02:13     Performed at [facility]   Culture     Final   Value: NO GROWTH 5 DAYS     Performed at [facility]   Report Status 12/29/2012 FINAL   Final  CULTURE, BLOOD (ROUTINE X 2)     Status: None   Collection  Time    12/22/12  8:00 PM      Result Value Range Status   Specimen Description BLOOD RIGHT ARM   Final   Special Requests BOTTLES DRAWN AEROBIC AND ANAEROBIC   Final   Culture  Setup Time     Final   Value: 12/23/2012 02:13     Performed at [facility]   Culture     Final   Value: MICROCOCCUS SPECIES     Note: Standardized susceptibility testing for this organism is not available.     Note: Gram Stain Report Called to,Read Back By and Verified With: ANGELITO BY INGRAM A 12/24/12 1145AM     Performed at [facility]   Report Status 12/25/2012 FINAL   Final  URINE CULTURE     Status: None   Collection Time    12/22/12  8:34 PM      Result Value Range Status   Specimen Description URINE, CATHETERIZED   Final   Special Requests NONE   Final   Culture  Setup Time     Final   Value: 12/23/2012 00:29     Performed at [facility] Count     Final   Value: NO GROWTH     Performed at [facility]   Culture     Final   Value: NO GROWTH     Performed at [facility]   Report Status 12/23/2012 FINAL   Final  MRSA PCR SCREENING     Status: None   Collection Time    12/23/12  1:50 AM      Result Value Range Status   MRSA by PCR NEGATIVE  NEGATIVE Final   Comment:            The GeneXpert MRSA Assay (FDA     approved for NASAL specimens     only), is one component of a     comprehensive MRSA colonization     surveillance program. It is not     intended to diagnose MRSA     infection nor to guide or     monitor treatment for     MRSA infections.  CSF CULTURE     Status: None   Collection Time    12/25/12 10:29 AM      Result Value Range Status   Specimen Description CSF   Final   Special Requests 3.0ML   Final   Gram Stain     Final   Value: NO WBC SEEN     NO ORGANISMS SEEN     CYTOSPIN Performed at [facility]     Performed at [facility]   Culture     Final   Value: NO GROWTH 3 DAYS     Performed at  [facility]   Report Status 12/28/2012 FINAL   Final  GRAM STAIN     Status: None   Collection Time    12/25/12 10:29 AM      Result Value Range Status   Specimen Description CSF   Final   Special Requests 3.0ML   Final   Gram Stain     Final   Value: CYTOSPIN SLIDE:     NO WBC SEEN     NO ORGANISMS SEEN   Report Status 12/25/2012 FINAL   Final     Labs: Basic Metabolic  Panel:  Recent Labs Lab 12/22/12 1955 12/23/12 0359 12/24/12 0608 12/25/12 0530 12/26/12 0455  NA 139 139 142 139 138  K 3.6 3.8 3.5 3.2* 4.8  CL 106 105 108 103 105  CO2 25 27 26 26 25   GLUCOSE 112* 115* 94 96 99  BUN 20 18 19 18 11   CREATININE 1.37* 1.36* 1.24 1.39* 1.11  CALCIUM 8.8 9.1 8.0* 8.0* 8.0*  MG  --   --   --   --  1.8   Liver Function Tests:  Recent Labs Lab 12/23/12 0359  AST 22  ALT 10  ALKPHOS 43  BILITOT 0.7  PROT 5.8*  ALBUMIN 3.1*   No results found for this basename: LIPASE, AMYLASE,  in the last 168 hours No results found for this basename: AMMONIA,  in the last 168 hours CBC:  Recent Labs Lab 12/22/12 1955 12/23/12 0359 12/24/12 0608  WBC 7.1 4.7 7.2  NEUTROABS 6.3  --   --   HGB 12.1* 12.6* 12.0*  HCT 36.4* 38.4* 35.1*  MCV 93.3 93.9 91.4  PLT 176 157 137*   Cardiac Enzymes: No results found for this basename: CKTOTAL, CKMB, CKMBINDEX, TROPONINI,  in the last 168 hours BNP: BNP (last 3 results) No results found for this basename: PROBNP,  in the last 8760 hours CBG:  Recent Labs Lab 12/26/12 0634 12/26/12 1641 12/27/12 0651 12/28/12 0630 12/29/12 0627  GLUCAP 111* 86 108* 103* 106*       Signed:  Penny Pia  [facility] 12/29/2012, 12:41 PM  Patient seen and evaluated today. Okay for discharge once bed available. Will need to complete a 14 day course of IV antibiotics today being day #8.Also vancomycin level will need to be monitored by pharmacy at skilled nursing facility per their protocol.Once patient is [place] 14  day antibiotic treatment PICC line may be removed.  Penny Pia Today's date 12/30/2012 10:33 AM

## 2012-12-29 NOTE — Progress Notes (Signed)
Rec'd last minute call from [facility] indicating they are not able to accept patient due to concern related to "Rule out Meningitis"- they only have a semi-pvt room. Updated Dr. Cena Benton and will have to seek other bed options and revisit SNF d/c once beds are found.  Reece Levy, MSW, Theresia Majors [phone]

## 2012-12-29 NOTE — Progress Notes (Signed)
ANTIBIOTIC CONSULT NOTE - FOLLOW UP  Pharmacy Consult:  Vancomycin / Rocephin Indication:  Fever and possible meningitis  No Known Allergies  Patient Measurements: Height: 5\' 10"  (177.8 cm) Weight: 202 lb 2.6 oz (91.7 kg) IBW/kg (Calculated) : 73  Vital Signs: Temp: 98.2 F (36.8 C) (10/20 0950) Temp src: Oral (10/20 0950) BP: 108/54 mmHg (10/20 0950) Pulse Rate: 60 (10/20 0950)  Labs: No results found for this basename: WBC, HGB, PLT, LABCREA, CREATININE,  in the last 72 hours Estimated Creatinine Clearance: 57.4 ml/min (by C-G formula based on Cr of 1.11).  Recent Labs  12/26/12 1711  VANCOTROUGH 15.0     Microbiology: Recent Results (from the past 720 hour(s))  CULTURE, BLOOD (ROUTINE X 2)     Status: None   Collection Time    12/22/12  7:55 PM      Result Value Range Status   Specimen Description BLOOD RIGHT HAND   Final   Special Requests BOTTLES DRAWN AEROBIC AND ANAEROBIC   Final   Culture  Setup Time     Final   Value: 12/23/2012 02:13     Performed at [facility]   Culture     Final   Value: NO GROWTH 5 DAYS     Performed at [facility]   Report Status 12/29/2012 FINAL   Final  CULTURE, BLOOD (ROUTINE X 2)     Status: None   Collection Time    12/22/12  8:00 PM      Result Value Range Status   Specimen Description BLOOD RIGHT ARM   Final   Special Requests BOTTLES DRAWN AEROBIC AND ANAEROBIC   Final   Culture  Setup Time     Final   Value: 12/23/2012 02:13     Performed at [facility]   Culture     Final   Value: MICROCOCCUS SPECIES     Note: Standardized susceptibility testing for this organism is not available.     Note: Gram Stain Report Called to,Read Back By and Verified With: ANGELITO BY INGRAM A 12/24/12 1145AM     Performed at [facility]   Report Status 12/25/2012 FINAL   Final  URINE CULTURE     Status: None   Collection Time    12/22/12  8:34 PM      Result Value Range Status   Specimen  Description URINE, CATHETERIZED   Final   Special Requests NONE   Final   Culture  Setup Time     Final   Value: 12/23/2012 00:29     Performed at [facility] Count     Final   Value: NO GROWTH     Performed at [facility]   Culture     Final   Value: NO GROWTH     Performed at [facility]   Report Status 12/23/2012 FINAL   Final  MRSA PCR SCREENING     Status: None   Collection Time    12/23/12  1:50 AM      Result Value Range Status   MRSA by PCR NEGATIVE  NEGATIVE Final   Comment:            The GeneXpert MRSA Assay (FDA     approved for NASAL specimens     only), is one component of a     comprehensive MRSA colonization     surveillance program. It is not  intended to diagnose MRSA     infection nor to guide or     monitor treatment for     MRSA infections.  CSF CULTURE     Status: None   Collection Time    12/25/12 10:29 AM      Result Value Range Status   Specimen Description CSF   Final   Special Requests 3.0ML   Final   Gram Stain     Final   Value: NO WBC SEEN     NO ORGANISMS SEEN     CYTOSPIN Performed at [facility]     Performed at [facility]   Culture     Final   Value: NO GROWTH 3 DAYS     Performed at [facility]   Report Status 12/28/2012 FINAL   Final  GRAM STAIN     Status: None   Collection Time    12/25/12 10:29 AM      Result Value Range Status   Specimen Description CSF   Final   Special Requests 3.0ML   Final   Gram Stain     Final   Value: CYTOSPIN SLIDE:     NO WBC SEEN     NO ORGANISMS SEEN   Report Status 12/25/2012 FINAL   Final      Assessment: 85 YOM to continue on vancomycin and ceftriaxone for meningitis.  Patient's renal was function as of 12/26/12.  Previous vancomycin trough was therapeutic.  Noted discharged summary completed.  10/14 vanc >> 10/14 ctx >> 10/14 amp >> 10/17  10/17 VT = 15 mcg/mL  10/13 BCx:1/2 micrococcus (likely contaminant) 10/13  UrineCx:neg 10/14 MRSA PCR:neg 10/16 CSF: ngtd 10/16 CSF analysis: normal    Goal of Therapy:  Vancomycin trough level 15-20 mcg/ml   Plan:  - Vanc 750mg  IV Q12H - Rocephin 2gm IV Q12H - Monitor renal fxn, clinical status, weekly vanc trough - Recommend repeat BMET and vanc trough within 1-2 days post discharge    Eastyn Dattilo D. Laney Potash, PharmD, BCPS Pager:  [phone] 12/29/2012, 3:33 PM

## 2012-12-29 NOTE — Progress Notes (Signed)
SNF bed accepted at [facility]- son to assist with paperwork at [facility]- will plan transport via EMS- Reece Levy, MSW, [employer] [phone]

## 2012-12-29 NOTE — Progress Notes (Signed)
Clinical Social Work Department CLINICAL SOCIAL WORK PLACEMENT NOTE 12/29/2012  Patient:  MOMEN, HAM  Account Number:  [id number] Admit date:  12/22/2012  Clinical Social Worker:  Genelle Bal, LCSW  Date/time:  12/26/2012 08:18 AM  Clinical Social Work is seeking post-discharge placement for this patient at the following level of care:   SKILLED NURSING   (*CSW will update this form in Epic as items are completed)   12/26/2012  Patient/family provided with [facility] Department of Clinical Social Work's list of facilities offering this level of care within the geographic area requested by the patient (or if unable, by the patient's family).  12/26/2012  Patient/family informed of their freedom to choose among providers that offer the needed level of care, that participate in Medicare, Medicaid or managed care program needed by the patient, have an available bed and are willing to accept the patient.    Patient/family informed of [facility]' ownership interest in [facility], as well as of the fact that they are under no obligation to receive care at this facility.  PASARR submitted to EDS on  PASARR number received from EDS on   FL2 transmitted to all facilities in geographic area requested by pt/family on   FL2 transmitted to all facilities within larger geographic area on   Patient informed that his/her managed care company has contracts with or will negotiate with  certain facilities, including the following:     Patient/family informed of bed offers received:   Patient chooses bed at  Physician recommends and patient chooses bed at    Patient to be transferred to  on   Patient to be transferred to facility by   The following physician request were entered in Epic:   Additional Comments: Reece Levy, MSW, [facility] [phone]

## 2012-12-29 NOTE — Procedures (Signed)
Procedure:  Right arm PICC line Access:  Right basilic vein Findings:  41 cm DL Power PICC placed with tip at cavoatrial junction.  OK to use.

## 2012-12-29 NOTE — Progress Notes (Signed)
Patient ID: Charles Lambert, male   DOB: Aug 13, 1927, 79 y.o.   MRN: [id number]  Plan: Continue vancomycin and ceftriaxone for the full course  Assessment: Suspected encephalitis vs meningitis, being treated for meningitis empirically   Subjectively: He says he is feeling well overall but was unable to comment further   Objectively:  Last recorded: 10/20 0530   BP: 160/67 Pulse: 60  Temp: 97.6 F (36.4 C) Resp: 18  SpO2: 92     Abdomen: soft, non tender, gs+ve CVS: S1+S2+0 Resp: normal vesicular breathing, no added sounds Other exams normal    Herpes simplex virus(hsv) dna by pcr [[id number]]   Collected: 12/25/12 1029   Updated: 12/26/12 1216   Specimen Type: Cerebrospinal Fluid    Specimen source hsv CSF    HSV 1 DNA Not Detected    HSV 2 DNA Not Detected      Culture, blood (routine x 2) [[id number]] Collected: 12/22/12 1955 Updated: 12/29/12 0804 Specimen Type: Blood Specimen Description BLOOD RIGHT HAND Special Requests BOTTLES DRAWN AEROBIC AND ANAEROBIC Culture Setup Time - Result: 12/23/2012 02:13 Performed at [facility] Culture - Result: NO GROWTH 5 DAYS Performed at [facility] Report Status 12/29/2012 FINAL

## 2012-12-29 NOTE — Progress Notes (Signed)
Went in at 8am to do assessment and pt had pulled his PICC line completely out and was holding it in his hand. Upon assessment site was no longer bleeding, held some pressure and put a dressing over site. Pt is in flat position. IV team and physician notified.

## 2012-12-29 NOTE — Discharge Summary (Deleted)
Discharge Exam: Filed Vitals:   12/29/12 0950  BP: 108/54  Pulse: 60  Temp: 98.2 F (36.8 C)  Resp: 18    General: pt in chair in NAD Cardiovascular: S1 and S2 with no MRG. Radial and pedal pulses 2+ with no cyanosis or edema Respiratory: regular unlabored breathing. Lungs CTA BL with no wheeze  Discharge Instructions     Medication List    ASK your doctor about these medications       cholecalciferol 1000 UNITS tablet  Commonly known as:  VITAMIN D  Take 1,000 Units by mouth daily.     finasteride 5 MG tablet  Commonly known as:  PROSCAR  Take 5 mg by mouth at bedtime.     lansoprazole 30 MG capsule  Commonly known as:  PREVACID  Take 30 mg by mouth daily.     memantine 10 MG tablet  Commonly known as:  NAMENDA  Take 10 mg by mouth 2 (two) times daily.     nystatin cream  Commonly known as:  MYCOSTATIN  Apply 1 application topically 2 (two) times daily.     rivastigmine 9.5 mg/24hr  Commonly known as:  EXELON  Place 1 patch onto the skin daily.     sertraline 25 MG tablet  Commonly known as:  ZOLOFT  Take 25 mg by mouth daily.     simvastatin 40 MG tablet  Commonly known as:  ZOCOR  Take 40 mg by mouth every evening.     tamsulosin 0.4 MG Caps capsule  Commonly known as:  FLOMAX  Take 0.4 mg by mouth daily after supper.     traMADol 50 MG tablet  Commonly known as:  ULTRAM  Take 25 mg by mouth every 6 (six) hours as needed for pain. Take 1/2 tab every 6 hours as needed for pain     traZODone 50 MG tablet  Commonly known as:  DESYREL  Take 25 mg by mouth at bedtime. Take 1/2 tab at night as needed for insomnia     vitamin B-12 1000 MCG tablet  Commonly known as:  CYANOCOBALAMIN  Take 1,000 mcg by mouth daily.       No Known Allergies    The results of significant diagnostics from this hospitalization (including imaging, microbiology, ancillary and laboratory) are listed below for reference.    Significant Diagnostic Studies: Ct Head  Wo Contrast  12/24/2012   CLINICAL DATA:  Confusion.  EXAM: CT HEAD WITHOUT CONTRAST  TECHNIQUE: Contiguous axial images were obtained from the base of the skull through the vertex without intravenous contrast.  COMPARISON:  12/22/2012 and 06/16/2012.  FINDINGS: Prior for burrhole procedure for drainage of left-sided subdural hematoma as per history provided.  No intracranial hemorrhage.  Small vessel disease type changes without CT evidence of large acute infarct.  Global atrophy without hydrocephalus.  No intracranial mass lesion noted on this unenhanced exam.  Vascular calcifications.  IMPRESSION: No acute abnormality. Please see above.   Electronically Signed   By: Bridgett Larsson M.D.   On: 12/24/2012 12:54   Ct Head Wo Contrast  12/22/2012   CLINICAL DATA:  Altered mental status. Fall. Head trauma. Prior subdural hematoma.  EXAM: CT HEAD WITHOUT CONTRAST  TECHNIQUE: Contiguous axial images were obtained from the base of the skull through the vertex without intravenous contrast.  COMPARISON:  06/16/2012  FINDINGS: There is no evidence of intracranial hemorrhage, brain edema, or other signs of acute infarction. There is no evidence of  intracranial mass lesion or mass effect. No abnormal extraaxial fluid collections are identified.  Mild to moderate diffuse cerebral atrophy is stable as well as mild chronic small vessel disease. Ventricles are stable in size. No evidence of acute skull fracture. 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  IMPRESSION: No acute findings.  Stable cerebral atrophy and chronic small vessel disease.   Electronically Signed   By: Myles Rosenthal M.D.   On: 12/22/2012 21:34   Dg Chest Port 1 View  12/28/2012   CLINICAL DATA:  PICC line placement.  EXAM: PORTABLE CHEST - 1 VIEW  COMPARISON:  12/26/2012.  FINDINGS: The right PICC line tip is approximately 3 cm below the region of the cavoatrial junction and in the right atrium. The pacer wires are stable. Stable cardiac  enlargement, vascular congestion, bibasilar infiltrates and pleural effusions.  IMPRESSION: The right PICC line tip is in the right atrium approximately 3 cm below the region of the cavoatrial junction.   Electronically Signed   By: Loralie Champagne M.D.   On: 12/28/2012 12:55   Dg Chest Port 1 View  12/26/2012   CLINICAL DATA:  Cough and congestion.  EXAM: PORTABLE CHEST - 1 VIEW  COMPARISON:  12/22/2012.  FINDINGS: The heart is upper limits of normal and stable. The mediastinal and hilar contours are unchanged. There is tortuosity and calcification of the thoracic aorta. The pacer wires are stable. There are bibasilar infiltrates and small effusions.  IMPRESSION: Bibasilar infiltrates and small effusions.   Electronically Signed   By: Loralie Champagne M.D.   On: 12/26/2012 18:25   Dg Chest Port 1 View  12/22/2012   CLINICAL DATA:  Altered mental status. Weakness.  EXAM: PORTABLE CHEST - 1 VIEW  COMPARISON:  06/16/2012  FINDINGS: Low lung volumes noted. Heart size within normal limits. Both lungs are clear. Dual lead transvenous pacemaker remains in appropriate position.  IMPRESSION: No acute findings.   Electronically Signed   By: Myles Rosenthal M.D.   On: 12/22/2012 20:17   Dg Fluoro Guide Lumbar Puncture  12/25/2012   CLINICAL DATA:  Fever. Altered mental status.  EXAM: DIAGNOSTIC LUMBAR PUNCTURE UNDER FLUOROSCOPIC GUIDANCE  FLUOROSCOPY TIME:  45 seconds.  PROCEDURE: Informed consent was obtained from the patient prior to the procedure, including potential complications of headache, allergy, and pain. With the patient prone, the lower back was prepped with Betadine. 1% Lidocaine was used for local anesthesia. Lumbar puncture was performed at the L3-4 level using a gauge needle with return of clear CSF with an opening pressure of 21 cm water. 11.54ml of CSF were obtained for laboratory studies. The patient tolerated the procedure well and there were no apparent complications.  IMPRESSION: Successful lumbar  puncture as above.   Electronically Signed   By: Drusilla Kanner M.D.   On: 12/25/2012 11:31    Microbiology: Recent Results (from the past 240 hour(s))  CULTURE, BLOOD (ROUTINE X 2)     Status: None   Collection Time    12/22/12  7:55 PM      Result Value Range Status   Specimen Description BLOOD RIGHT HAND   Final   Special Requests BOTTLES DRAWN AEROBIC AND ANAEROBIC   Final   Culture  Setup Time     Final   Value: 12/23/2012 02:13     Performed at [facility]   Culture     Final   Value: NO GROWTH 5 DAYS     Performed at [facility]   Report Status 12/29/2012 FINAL   Final  CULTURE, BLOOD (ROUTINE X 2)     Status: None   Collection Time    12/22/12  8:00 PM      Result Value Range Status   Specimen Description BLOOD RIGHT ARM   Final   Special Requests BOTTLES DRAWN AEROBIC AND ANAEROBIC   Final   Culture  Setup Time     Final   Value: 12/23/2012 02:13     Performed at [facility]   Culture     Final   Value: MICROCOCCUS SPECIES     Note: Standardized susceptibility testing for this organism is not available.     Note: Gram Stain Report Called to,Read Back By and Verified With: ANGELITO BY INGRAM A 12/24/12 1145AM     Performed at [facility]   Report Status 12/25/2012 FINAL   Final  URINE CULTURE     Status: None   Collection Time    12/22/12  8:34 PM      Result Value Range Status   Specimen Description URINE, CATHETERIZED   Final   Special Requests NONE   Final   Culture  Setup Time     Final   Value: 12/23/2012 00:29     Performed at [facility] Count     Final   Value: NO GROWTH     Performed at [facility]   Culture     Final   Value: NO GROWTH     Performed at [facility]   Report Status 12/23/2012 FINAL   Final  MRSA PCR SCREENING     Status: None   Collection Time    12/23/12  1:50 AM      Result Value Range Status   MRSA by PCR NEGATIVE  NEGATIVE Final   Comment:             The GeneXpert MRSA Assay (FDA     approved for NASAL specimens     only), is one component of a     comprehensive MRSA colonization     surveillance program. It is not     intended to diagnose MRSA     infection nor to guide or     monitor treatment for     MRSA infections.  CSF CULTURE     Status: None   Collection Time    12/25/12 10:29 AM      Result Value Range Status   Specimen Description CSF   Final   Special Requests 3.0ML   Final   Gram Stain     Final   Value: NO WBC SEEN     NO ORGANISMS SEEN     CYTOSPIN Performed at [facility]     Performed at [facility]   Culture     Final   Value: NO GROWTH 3 DAYS     Performed at [facility]   Report Status 12/28/2012 FINAL   Final  GRAM STAIN     Status: None   Collection Time    12/25/12 10:29 AM      Result Value Range Status   Specimen Description CSF   Final   Special Requests 3.0ML   Final   Gram Stain     Final   Value: CYTOSPIN SLIDE:     NO WBC SEEN     NO ORGANISMS SEEN   Report Status 12/25/2012 FINAL  Final     Labs: Basic Metabolic Panel:  Recent Labs Lab 12/22/12 1955 12/23/12 0359 12/24/12 0608 12/25/12 0530 12/26/12 0455  NA 139 139 142 139 138  K 3.6 3.8 3.5 3.2* 4.8  CL 106 105 108 103 105  CO2 25 27 26 26 25   GLUCOSE 112* 115* 94 96 99  BUN 20 18 19 18 11   CREATININE 1.37* 1.36* 1.24 1.39* 1.11  CALCIUM 8.8 9.1 8.0* 8.0* 8.0*  MG  --   --   --   --  1.8   Liver Function Tests:  Recent Labs Lab 12/23/12 0359  AST 22  ALT 10  ALKPHOS 43  BILITOT 0.7  PROT 5.8*  ALBUMIN 3.1*   No results found for this basename: LIPASE, AMYLASE,  in the last 168 hours No results found for this basename: AMMONIA,  in the last 168 hours CBC:  Recent Labs Lab 12/22/12 1955 12/23/12 0359 12/24/12 0608  WBC 7.1 4.7 7.2  NEUTROABS 6.3  --   --   HGB 12.1* 12.6* 12.0*  HCT 36.4* 38.4* 35.1*  MCV 93.3 93.9 91.4  PLT 176 157 137*   Cardiac Enzymes: No results  found for this basename: CKTOTAL, CKMB, CKMBINDEX, TROPONINI,  in the last 168 hours BNP: BNP (last 3 results) No results found for this basename: PROBNP,  in the last 8760 hours CBG:  Recent Labs Lab 12/26/12 0634 12/26/12 1641 12/27/12 0651 12/28/12 0630 12/29/12 0627  GLUCAP 111* 86 108* 103* 106*       Signed:  Penny Pia  [facility] 12/29/2012, 10:27 AM

## 2012-12-29 NOTE — Progress Notes (Signed)
Note reviewed, agree with plan as outlined previously.

## 2012-12-29 NOTE — Progress Notes (Signed)
Physical Therapy Treatment Patient Details Name: Charles Lambert MRN: [id number] DOB: [birth year] Today's Date: 12/29/2012 Time: 9562-1308 PT Time Calculation (min): 19 min  PT Assessment / Plan / Recommendation  History of Present Illness pt presents with AMS and r/o meningitis.     PT Comments   Pt with much improved cognition, arousal and participation today.  Still requires 2 person A for safety.  Will continue to follow.    Follow Up Recommendations  SNF     Does the patient have the potential to tolerate intense rehabilitation     Barriers to Discharge        Equipment Recommendations  None recommended by PT    Recommendations for Other Services    Frequency Min 3X/week   Progress towards PT Goals Progress towards PT goals: Progressing toward goals  Plan Current plan remains appropriate    Precautions / Restrictions Precautions Precautions: Fall Restrictions Weight Bearing Restrictions: No   Pertinent Vitals/Pain Denied pain.      Mobility  Bed Mobility Bed Mobility: Supine to Sit;Sitting - Scoot to Edge of Bed Supine to Sit: 3: Mod assist Sitting - Scoot to Edge of Bed: 3: Mod assist Details for Bed Mobility Assistance: pt with increased participation and able to A with bed mobility.   Transfers Transfers: Sit to Stand;Stand to Sit Sit to Stand: 1: +2 Total assist;From bed Sit to Stand: Patient Percentage: 60% Stand to Sit: 3: Mod assist;To chair/3-in-1 Details for Transfer Assistance: Hand over hand cueing for safe technique.   Ambulation/Gait Ambulation/Gait Assistance: 3: Mod assist Ambulation Distance (Feet): 60 Feet Assistive device: Rolling walker Ambulation/Gait Assistance Details: cues and A for management of RW, upright posture.  pt tends to lean anteriorly and pushes RW far out in front of him.  pt needs A to avoid obstacles.   Gait Pattern: Step-through pattern;Decreased stride length;Trunk flexed Stairs: No Wheelchair Mobility Wheelchair  Mobility: No    Exercises     PT Diagnosis:    PT Problem List:   PT Treatment Interventions:     PT Goals (current goals can now be found in the care plan section) Acute Rehab PT Goals Patient Stated Goal: None stated.   Time For Goal Achievement: 01/07/13 Potential to Achieve Goals: Fair  Visit Information  Last PT Received On: 12/29/12 Assistance Needed: +2 History of Present Illness: pt presents with AMS and r/o meningitis.      Subjective Data  Patient Stated Goal: None stated.     Cognition  Cognition Arousal/Alertness: Awake/alert Behavior During Therapy: Flat affect Overall Cognitive Status: No family/caregiver present to determine baseline cognitive functioning General Comments: pt with much improved cognition compared to last week.  ? if this is close to pt's baseline cognition, however no family present today.      Balance  Balance Balance Assessed: Yes Static Standing Balance Static Standing - Balance Support: Bilateral upper extremity supported Static Standing - Level of Assistance: 4: Min assist  End of Session PT - End of Session Equipment Utilized During Treatment: Gait belt Activity Tolerance: Patient tolerated treatment well Patient left: in chair;with call bell/phone within reach;with chair alarm set Nurse Communication: Mobility status   GP     Sunny Schlein,  [phone] 12/29/2012, 10:29 AM

## 2012-12-29 NOTE — Progress Notes (Signed)
SNF bed offers rec'd presented to son earlier today- he is out looking at [facility]- Dr. Cena Benton has just instructed me that patient is ready for d/c today-  Message left for son to return my call with bed selection- will advise once I speak with him and have bed selection.  Reece Levy, MSW, [facility] [phone]

## 2012-12-30 LAB — GLUCOSE, CAPILLARY: Glucose-Capillary: 95 mg/dL (ref 70–99)

## 2012-12-30 MED ORDER — HEPARIN SOD (PORK) LOCK FLUSH 100 UNIT/ML IV SOLN
250.0000 [IU] | INTRAVENOUS | Status: AC | PRN
  Administered 2012-12-30: 250 [IU]

## 2012-12-30 NOTE — Plan of Care (Signed)
Report called to Delma Officer, RN at [facility].

## 2012-12-30 NOTE — Progress Notes (Signed)
Physical Therapy Treatment Patient Details Name: Charles Lambert MRN: [id number] DOB: February 09, 1928 Today's Date: 12/30/2012 Time: 1478-2956 PT Time Calculation (min): 23 min  PT Assessment / Plan / Recommendation  History of Present Illness pt presents with AMS and r/o meningitis.     PT Comments   Pt cont's to be pleasantly confused.  Requires +2 for safety.    Follow Up Recommendations  SNF     Does the patient have the potential to tolerate intense rehabilitation     Barriers to Discharge        Equipment Recommendations  None recommended by PT    Recommendations for Other Services    Frequency Min 3X/week   Progress towards PT Goals Progress towards PT goals: Progressing toward goals  Plan Current plan remains appropriate    Precautions / Restrictions Precautions Precautions: Fall Restrictions Weight Bearing Restrictions: No       Mobility  Bed Mobility Bed Mobility: Supine to Sit;Sitting - Scoot to Edge of Bed Supine to Sit: 4: Min assist;HOB elevated;With rails Sitting - Scoot to Edge of Bed: 3: Mod assist Details for Bed Mobility Assistance: max directional cues.  Hand over hand for UE placement & use of UE's but pt still reaching for therapist to pull himself forwards Transfers Transfers: Sit to Stand;Stand to Sit Sit to Stand: 1: +2 Total assist;With upper extremity assist;From bed;From chair/3-in-1 Sit to Stand: Patient Percentage: 60% Stand to Sit: With upper extremity assist;To chair/3-in-1;3: Mod assist Details for Transfer Assistance: Hand over hand cueing for safe technique.   Ambulation/Gait Ambulation/Gait Assistance: 3: Mod assist Ambulation Distance (Feet): 150 Feet (75' x 2) Assistive device: Rolling walker Ambulation/Gait Assistance Details: cues & (A) for RW management, balance, & upright posture.  Pt with forward flexed posture & pushes RW too far out front of him.   Gait Pattern: Step-through pattern;Decreased stride length;Trunk  flexed;Shuffle Stairs: No Wheelchair Mobility Wheelchair Mobility: No      PT Goals (current goals can now be found in the care plan section) Acute Rehab PT Goals PT Goal Formulation: With patient Time For Goal Achievement: 01/07/13 Potential to Achieve Goals: Fair  Visit Information  Last PT Received On: 12/30/12 Assistance Needed: +2 History of Present Illness: pt presents with AMS and r/o meningitis.      Subjective Data      Cognition  Cognition Arousal/Alertness: Awake/alert Behavior During Therapy: Flat affect Overall Cognitive Status: No family/caregiver present to determine baseline cognitive functioning    Balance     End of Session PT - End of Session Equipment Utilized During Treatment: Gait belt Activity Tolerance: Patient tolerated treatment well Patient left: in chair;with call bell/phone within reach;with chair alarm set Nurse Communication: Mobility status   GP     Charles Lambert 12/30/2012, 9:37 AM   Charles Lambert, PTA [phone] 12/30/2012

## 2012-12-30 NOTE — Progress Notes (Signed)
SNF bed confirmed for patient at [facility]- family pleased and agreeable- plan dc via EMS- Reece Levy, MSW, Theresia Majors [phone]

## 2012-12-30 NOTE — Progress Notes (Signed)
Clinical Social Work Department CLINICAL SOCIAL WORK PLACEMENT NOTE 12/30/2012  Patient:  Charles Lambert, Charles Lambert  Account Number:  [id number] Admit date:  12/22/2012  Clinical Social Worker:  Genelle Bal, LCSW  Date/time:  12/26/2012 08:18 AM  Clinical Social Work is seeking post-discharge placement for this patient at the following level of care:   SKILLED NURSING   (*CSW will update this form in Epic as items are completed)   12/26/2012  Patient/family provided with [facility] Department of Clinical Social Work's list of facilities offering this level of care within the geographic area requested by the patient (or if unable, by the patient's family).  12/26/2012  Patient/family informed of their freedom to choose among providers that offer the needed level of care, that participate in Medicare, Medicaid or managed care program needed by the patient, have an available bed and are willing to accept the patient.    Patient/family informed of [facility]' ownership interest in [facility], as well as of the fact that they are under no obligation to receive care at this facility.  PASARR submitted to EDS on 12/29/2012 PASARR number received from EDS on 12/29/2012  FL2 transmitted to all facilities in geographic area requested by pt/family on  12/27/2012 FL2 transmitted to all facilities within larger geographic area on   Patient informed that his/her managed care company has contracts with or will negotiate with  certain facilities, including the following:     Patient/family informed of bed offers received:  12/30/2012 Patient chooses bed at [facility] Physician recommends and patient chooses bed at    Patient to be transferred to [facility] on  12/30/2012 Patient to be transferred to facility by EMS  The following physician request were entered in Epic:   Additional Comments: Reece Levy, MSW, Theresia Majors [phone]

## 2013-01-04 ENCOUNTER — Other Ambulatory Visit: Payer: Self-pay

## 2013-01-04 LAB — BUN: BUN: 12 mg/dL (ref 7–18)

## 2013-01-04 LAB — CREATININE, SERUM: EGFR (Non-African Amer.): 50 — ABNORMAL LOW

## 2013-01-05 ENCOUNTER — Encounter: Payer: Self-pay | Admitting: Internal Medicine

## 2013-01-05 ENCOUNTER — Non-Acute Institutional Stay (SKILLED_NURSING_FACILITY): Payer: Medicare Other | Admitting: Internal Medicine

## 2013-01-05 DIAGNOSIS — R569 Unspecified convulsions: Secondary | ICD-10-CM

## 2013-01-05 DIAGNOSIS — R5381 Other malaise: Secondary | ICD-10-CM

## 2013-01-05 DIAGNOSIS — K219 Gastro-esophageal reflux disease without esophagitis: Secondary | ICD-10-CM

## 2013-01-05 DIAGNOSIS — R531 Weakness: Secondary | ICD-10-CM

## 2013-01-05 DIAGNOSIS — F329 Major depressive disorder, single episode, unspecified: Secondary | ICD-10-CM

## 2013-01-05 DIAGNOSIS — R404 Transient alteration of awareness: Secondary | ICD-10-CM

## 2013-01-05 DIAGNOSIS — F0390 Unspecified dementia without behavioral disturbance: Secondary | ICD-10-CM

## 2013-01-05 DIAGNOSIS — G9340 Encephalopathy, unspecified: Secondary | ICD-10-CM

## 2013-01-05 DIAGNOSIS — N400 Enlarged prostate without lower urinary tract symptoms: Secondary | ICD-10-CM

## 2013-01-05 DIAGNOSIS — R400 Somnolence: Secondary | ICD-10-CM

## 2013-01-05 DIAGNOSIS — F0280 Dementia in other diseases classified elsewhere without behavioral disturbance: Secondary | ICD-10-CM

## 2013-01-05 DIAGNOSIS — D649 Anemia, unspecified: Secondary | ICD-10-CM

## 2013-01-05 NOTE — Progress Notes (Signed)
Patient ID: Charles Lambert, male   DOB: 20-May-1927, 79 y.o.   MRN: [id number]  [facility]   PCP: [name], MD  Code Status: dnr  No Known Allergies  Chief Complaint  Patient presents with  . Hospitalization Follow-up    new admit    HPI:  Elderly male patient is here for STR post hospital admission from 12/22/12- 12/29/12 with fever and acute encephalopathy. There were concerns for meningitis. Neurology was consulted. HSV workup was negative. CSF culture had no growth. ID was consulted and recommended 2 weeks course of vancomycin and ceftriaxone. PICC line was placed and with his need for antibiotic, confusion and weakness, he was sent to SNF Patient was seen in his room today. He has confusion but no acute behavioral concerns from staff. No falls reported. No new concern from staff. Remains afebrile.   Review of Systems:  Unable to obtain ROS due to his confusion  Past Medical History  Diagnosis Date  . Dementia   . Subdural hematoma   . Prostate enlargement   . Pacemaker   . Sinoatrial node dysfunction    Past Surgical History  Procedure Laterality Date  . Burr hole     Social History:   reports that he has never smoked. He does not have any smokeless tobacco history on file. He reports that he does not drink alcohol or use illicit drugs.  Family History  Problem Relation Age of Onset  . Hypertension Mother   . Hypertension Father     Medications: Patient's Medications  New Prescriptions   No medications on file  Previous Medications   CHOLECALCIFEROL (VITAMIN D) 1000 UNITS TABLET    Take 1,000 Units by mouth daily.   DEXTROSE 5 % SOLN 50 ML WITH CEFTRIAXONE 2 G SOLR 2 G    Inject 2 g into the vein every 12 (twelve) hours.   FINASTERIDE (PROSCAR) 5 MG TABLET    Take 5 mg by mouth at bedtime.    LANSOPRAZOLE (PREVACID) 30 MG CAPSULE    Take 30 mg by mouth daily.   MEMANTINE (NAMENDA) 10 MG TABLET    Take 10 mg by mouth 2 (two) times daily.   NYSTATIN  CREAM (MYCOSTATIN)    Apply 1 application topically 2 (two) times daily.   RIVASTIGMINE (EXELON) 9.5 MG/24HR    Place 1 patch onto the skin daily.   SERTRALINE (ZOLOFT) 25 MG TABLET    Take 25 mg by mouth daily.   SIMVASTATIN (ZOCOR) 40 MG TABLET    Take 40 mg by mouth every evening.   TAMSULOSIN HCL (FLOMAX) 0.4 MG CAPS    Take 0.4 mg by mouth daily after supper.    TRAMADOL (ULTRAM) 50 MG TABLET    Take 25 mg by mouth every 6 (six) hours as needed for pain. Take 1/2 tab every 6 hours as needed for pain   VANCOMYCIN (VANCOCIN) 750 MG/150ML SOLN    Inject 150 mLs (750 mg total) into the vein every 12 (twelve) hours.   VITAMIN B-12 (CYANOCOBALAMIN) 1000 MCG TABLET    Take 1,000 mcg by mouth daily.  Modified Medications   No medications on file  Discontinued Medications   No medications on file     Physical Exam:  Filed Vitals:   01/05/13 1412  BP: 140/74  Pulse: 84  Temp: 98.3 F (36.8 C)  Resp: 16  SpO2: 94%   General- elderly male in no acute distress Head- atraumatic, normocephalic Eyes- PERRLA, EOMI, no pallor,  no icterus Neck- no lymphadenopathy, no thyromegaly, no jugular vein distension, no carotid bruit Chest- no chest wall deformities, no chest wall tenderness Cardiovascular- normal s1,s2, no murmurs/ rubs/ gallops Respiratory- bilateral clear to auscultation, no wheeze, no rhonchi, no crackles Abdomen- bowel sounds present, soft, non tender, no organomegaly, no abdominal bruits, no guarding or rigidity, no CVA tenderness Musculoskeletal- able to move all 4 extremities, no spinal and paraspinal tenderness, steady gait, no use of assistive device Neurological- no focal deficit, generalized weakness Psychiatry- alert and oriented to person, confusion present    Labs reviewed: Basic Metabolic Panel:  Recent Labs  56/21/30 0608 12/25/12 0530 12/26/12 0455  NA 142 139 138  K 3.5 3.2* 4.8  CL 108 103 105  CO2 26 26 25   GLUCOSE 94 96 99  BUN 19 18 11   CREATININE  1.24 1.39* 1.11  CALCIUM 8.0* 8.0* 8.0*  MG  --   --  1.8   Liver Function Tests:  Recent Labs  06/16/12 1332 12/23/12 0359  AST 22 22  ALT 13 10  ALKPHOS 56 43  BILITOT 0.6 0.7  PROT 6.6 5.8*  ALBUMIN 3.6 3.1*   No results found for this basename: LIPASE, AMYLASE,  in the last 8760 hours No results found for this basename: AMMONIA,  in the last 8760 hours CBC:  Recent Labs  06/16/12 1332 12/22/12 1955 12/23/12 0359 12/24/12 0608  WBC 5.2 7.1 4.7 7.2  NEUTROABS 3.7 6.3  --   --   HGB 13.9 12.1* 12.6* 12.0*  HCT 41.2 36.4* 38.4* 35.1*  MCV 90.9 93.3 93.9 91.4  PLT 195 176 157 137*   CBG:  Recent Labs  12/28/12 0630 12/29/12 0627 12/30/12 0641  GLUCAP 103* 106* 95   01/02/13 na 140, k 3.6, bun 14, cr 1.2 12/31/12 wbc 6.2, hb 11, hct 34.4, plt 285  Radiological Exams: Ct Head Wo Contrast  12/24/2012   CLINICAL DATA:  Confusion.  EXAM: CT HEAD WITHOUT CONTRAST  TECHNIQUE: Contiguous axial images were obtained from the base of the skull through the vertex without intravenous contrast.  COMPARISON:  12/22/2012 and 06/16/2012.  FINDINGS: Prior for burrhole procedure for drainage of left-sided subdural hematoma as per history provided.  No intracranial hemorrhage.  Small vessel disease type changes without CT evidence of large acute infarct.  Global atrophy without hydrocephalus.  No intracranial mass lesion noted on this unenhanced exam.  Vascular calcifications.  IMPRESSION: No acute abnormality. Please see above.   Electronically Signed   By: Bridgett Larsson M.D.   On: 12/24/2012 12:54   Ct Head Wo Contrast  12/22/2012   CLINICAL DATA:  Altered mental status. Fall. Head trauma. Prior subdural hematoma.  EXAM: CT HEAD WITHOUT CONTRAST  TECHNIQUE: Contiguous axial images were obtained from the base of the skull through the vertex without intravenous contrast.  COMPARISON:  06/16/2012  FINDINGS: There is no evidence of intracranial hemorrhage, brain edema, or other signs of  acute infarction. There is no evidence of intracranial mass lesion or mass effect. No abnormal extraaxial fluid collections are identified.  Mild to moderate diffuse cerebral atrophy is stable as well as mild chronic small vessel disease. Ventricles are stable in size. No evidence of acute skull fracture. 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  IMPRESSION: No acute findings.  Stable cerebral atrophy and chronic small vessel disease.   Electronically Signed   By: Myles Rosenthal M.D.   On: 12/22/2012 21:34   Dg Chest Port 1 View  12/28/2012  CLINICAL DATA:  PICC line placement.  EXAM: PORTABLE CHEST - 1 VIEW  COMPARISON:  12/26/2012.  FINDINGS: The right PICC line tip is approximately 3 cm below the region of the cavoatrial junction and in the right atrium. The pacer wires are stable. Stable cardiac enlargement, vascular congestion, bibasilar infiltrates and pleural effusions.  IMPRESSION: The right PICC line tip is in the right atrium approximately 3 cm below the region of the cavoatrial junction.   Electronically Signed   By: Loralie Champagne M.D.   On: 12/28/2012 12:55   Dg Chest Port 1 View  12/26/2012   CLINICAL DATA:  Cough and congestion.  EXAM: PORTABLE CHEST - 1 VIEW  COMPARISON:  12/22/2012.  FINDINGS: The heart is upper limits of normal and stable. The mediastinal and hilar contours are unchanged. There is tortuosity and calcification of the thoracic aorta. The pacer wires are stable. There are bibasilar infiltrates and small effusions.  IMPRESSION: Bibasilar infiltrates and small effusions.   Electronically Signed   By: Loralie Champagne M.D.   On: 12/26/2012 18:25   Dg Chest Port 1 View  12/22/2012   CLINICAL DATA:  Altered mental status. Weakness.  EXAM: PORTABLE CHEST - 1 VIEW  COMPARISON:  06/16/2012  FINDINGS: Low lung volumes noted. Heart size within normal limits. Both lungs are clear. Dual lead transvenous pacemaker remains in appropriate position.  IMPRESSION: No acute findings.    Electronically Signed   By: Myles Rosenthal M.D.   On: 12/22/2012 20:17   Dg Fluoro Guide Lumbar Puncture  12/25/2012   CLINICAL DATA:  Fever. Altered mental status.  EXAM: DIAGNOSTIC LUMBAR PUNCTURE UNDER FLUOROSCOPIC GUIDANCE  FLUOROSCOPY TIME:  45 seconds.  PROCEDURE: Informed consent was obtained from the patient prior to the procedure, including potential complications of headache, allergy, and pain. With the patient prone, the lower back was prepped with Betadine. 1% Lidocaine was used for local anesthesia. Lumbar puncture was performed at the L3-4 level using a gauge needle with return of clear CSF with an opening pressure of 21 cm water. 11.39ml of CSF were obtained for laboratory studies. The patient tolerated the procedure well and there were no apparent complications.  IMPRESSION: Successful lumbar puncture as above.  Electronically Signed   By: Drusilla Kanner M.D.   On: 12/25/2012 11:31    Assessment/Plan  meningitis Continue vancomycin 750 mg iv q12h and has recheck on vancomycin trough ordered along with renal function. PICC line site care. Continue florastor supplement. Also to continue ceftriaxone. Monitor wbc and temperature curve. Will need to remove picc line after course of antibiotic is completed  Generalized weakness Continue PT and OT Continue speech therapy. Aspiration precautions Fall precautions  Anemia Monitor cbc, no bleed reported, no active signs of bleed Tinea pedis continue lotrimin cream bid in between the toes  Vitamin d def Continue vitamin d supplement  bph Continue finasteride and flomax, monitor symptoms, no signs of retention at present  GERD Continue prilosec, monitor status  Dementia Continue his exelon patch and namenda, fall precautions, skin care, aspiration precautions  Depression from dementia Continue zoloft, calm in the facility, baseline confusion present  Hyperlipidemia Continue zocor and monitor clinically  Day time  somnolence Continue his home regimen of modafinil  Seizure Remains seizure free. Continue his Psychologist, counselling Communication: reviewed plan with nursing supervisor   Labs/tests ordered- cbc, cmp, vancomycin level

## 2013-01-07 ENCOUNTER — Non-Acute Institutional Stay (SKILLED_NURSING_FACILITY): Payer: Medicare Other | Admitting: Nurse Practitioner

## 2013-01-07 ENCOUNTER — Emergency Department (HOSPITAL_COMMUNITY): Payer: Medicare Other

## 2013-01-07 ENCOUNTER — Inpatient Hospital Stay (HOSPITAL_COMMUNITY)
Admission: EM | Admit: 2013-01-07 | Discharge: 2013-01-15 | DRG: 100 | Disposition: A | Discharge status: Skilled Nursing Facility | Payer: Medicare Other | Attending: Internal Medicine | Admitting: Internal Medicine

## 2013-01-07 ENCOUNTER — Encounter (HOSPITAL_COMMUNITY): Payer: Self-pay | Admitting: Emergency Medicine

## 2013-01-07 ENCOUNTER — Ambulatory Visit (HOSPITAL_COMMUNITY): Payer: Medicare Other

## 2013-01-07 DIAGNOSIS — Z66 Do not resuscitate: Secondary | ICD-10-CM | POA: Diagnosis present

## 2013-01-07 DIAGNOSIS — R7881 Bacteremia: Secondary | ICD-10-CM

## 2013-01-07 DIAGNOSIS — F0390 Unspecified dementia without behavioral disturbance: Secondary | ICD-10-CM

## 2013-01-07 DIAGNOSIS — N179 Acute kidney failure, unspecified: Secondary | ICD-10-CM

## 2013-01-07 DIAGNOSIS — D649 Anemia, unspecified: Secondary | ICD-10-CM | POA: Diagnosis present

## 2013-01-07 DIAGNOSIS — I509 Heart failure, unspecified: Secondary | ICD-10-CM

## 2013-01-07 DIAGNOSIS — I5031 Acute diastolic (congestive) heart failure: Secondary | ICD-10-CM

## 2013-01-07 DIAGNOSIS — Z950 Presence of cardiac pacemaker: Secondary | ICD-10-CM | POA: Diagnosis present

## 2013-01-07 DIAGNOSIS — I4891 Unspecified atrial fibrillation: Secondary | ICD-10-CM

## 2013-01-07 DIAGNOSIS — R509 Fever, unspecified: Secondary | ICD-10-CM

## 2013-01-07 DIAGNOSIS — G4733 Obstructive sleep apnea (adult) (pediatric): Secondary | ICD-10-CM | POA: Diagnosis present

## 2013-01-07 DIAGNOSIS — R4189 Other symptoms and signs involving cognitive functions and awareness: Secondary | ICD-10-CM

## 2013-01-07 DIAGNOSIS — R400 Somnolence: Secondary | ICD-10-CM

## 2013-01-07 DIAGNOSIS — I495 Sick sinus syndrome: Secondary | ICD-10-CM

## 2013-01-07 DIAGNOSIS — R404 Transient alteration of awareness: Secondary | ICD-10-CM

## 2013-01-07 DIAGNOSIS — Z79899 Other long term (current) drug therapy: Secondary | ICD-10-CM

## 2013-01-07 DIAGNOSIS — R4182 Altered mental status, unspecified: Secondary | ICD-10-CM

## 2013-01-07 DIAGNOSIS — G9340 Encephalopathy, unspecified: Secondary | ICD-10-CM | POA: Diagnosis present

## 2013-01-07 DIAGNOSIS — E876 Hypokalemia: Secondary | ICD-10-CM | POA: Diagnosis present

## 2013-01-07 DIAGNOSIS — R569 Unspecified convulsions: Principal | ICD-10-CM | POA: Diagnosis present

## 2013-01-07 DIAGNOSIS — E785 Hyperlipidemia, unspecified: Secondary | ICD-10-CM | POA: Diagnosis present

## 2013-01-07 LAB — LACTIC ACID, PLASMA: Lactic Acid, Venous: 0.8 mmol/L (ref 0.5–2.2)

## 2013-01-07 LAB — COMPREHENSIVE METABOLIC PANEL
AST: 29 U/L (ref 0–37)
Alkaline Phosphatase: 52 U/L (ref 39–117)
BUN: 9 mg/dL (ref 6–23)
CO2: 27 mEq/L (ref 19–32)
Chloride: 103 mEq/L (ref 96–112)
Creatinine, Ser: 1.11 mg/dL (ref 0.50–1.35)
GFR calc non Af Amer: 59 mL/min — ABNORMAL LOW (ref >90)
Total Bilirubin: 0.5 mg/dL (ref 0.3–1.2)

## 2013-01-07 LAB — URINALYSIS, ROUTINE W REFLEX MICROSCOPIC
Bilirubin Urine: NEGATIVE
Glucose, UA: NEGATIVE mg/dL
Ketones, ur: NEGATIVE mg/dL
Nitrite: NEGATIVE
Specific Gravity, Urine: 1.009 (ref 1.005–1.030)
pH: 6.5 (ref 5.0–8.0)

## 2013-01-07 LAB — CBC WITH DIFFERENTIAL/PLATELET
HCT: 34.9 % — ABNORMAL LOW (ref 39.0–52.0)
Hemoglobin: 11.6 g/dL — ABNORMAL LOW (ref 13.0–17.0)
Lymphocytes Relative: 14 % (ref 12–46)
Monocytes Absolute: 0.5 10*3/uL (ref 0.1–1.0)
Monocytes Relative: 9 % (ref 3–12)
Neutro Abs: 4.3 10*3/uL (ref 1.7–7.7)
Platelets: 257 10*3/uL (ref 150–400)
RBC: 3.76 MIL/uL — ABNORMAL LOW (ref 4.22–5.81)
WBC: 5.9 10*3/uL (ref 4.0–10.5)

## 2013-01-07 LAB — PRO B NATRIURETIC PEPTIDE: Pro B Natriuretic peptide (BNP): 3754 pg/mL — ABNORMAL HIGH (ref 0–450)

## 2013-01-07 LAB — RAPID URINE DRUG SCREEN, HOSP PERFORMED
Amphetamines: NOT DETECTED
Barbiturates: NOT DETECTED
Benzodiazepines: NOT DETECTED
Cocaine: NOT DETECTED
Tetrahydrocannabinol: NOT DETECTED

## 2013-01-07 LAB — TROPONIN I: Troponin I: <0.3 ng/mL (ref <0.30)

## 2013-01-07 LAB — POCT I-STAT TROPONIN I: Troponin i, poc: 0.04 ng/mL (ref 0.00–0.08)

## 2013-01-07 LAB — PROTIME-INR: INR: 1.2 (ref 0.00–1.49)

## 2013-01-07 LAB — APTT: aPTT: 40 seconds — ABNORMAL HIGH (ref 24–37)

## 2013-01-07 LAB — URINE MICROSCOPIC-ADD ON

## 2013-01-07 MED ORDER — SERTRALINE HCL 25 MG PO TABS
25.0000 mg | ORAL_TABLET | Freq: Every day | ORAL | Status: DC
  Administered 2013-01-10 – 2013-01-12 (×3): 25 mg via ORAL
  Filled 2013-01-07 (×9): qty 1

## 2013-01-07 MED ORDER — ACETAMINOPHEN 325 MG PO TABS: 650.0000 mg | ORAL_TABLET | Freq: Four times a day (QID) | ORAL | Status: DC | PRN

## 2013-01-07 MED ORDER — SODIUM CHLORIDE 0.9 % IJ SOLN
3.0000 mL | Freq: Two times a day (BID) | INTRAMUSCULAR | Status: DC
  Administered 2013-01-08: 3 mL via INTRAVENOUS

## 2013-01-07 MED ORDER — SODIUM CHLORIDE 0.9 % IV SOLN
500.0000 mg | Freq: Two times a day (BID) | INTRAVENOUS | Status: DC
  Administered 2013-01-07 – 2013-01-08 (×2): 500 mg via INTRAVENOUS
  Filled 2013-01-07 (×3): qty 5

## 2013-01-07 MED ORDER — SACCHAROMYCES BOULARDII 250 MG PO CAPS
250.0000 mg | ORAL_CAPSULE | Freq: Two times a day (BID) | ORAL | Status: DC
  Administered 2013-01-10 – 2013-01-14 (×8): 250 mg via ORAL
  Filled 2013-01-07 (×17): qty 1

## 2013-01-07 MED ORDER — ENOXAPARIN SODIUM 30 MG/0.3ML ~~LOC~~ SOLN: 30.0000 mg | SUBCUTANEOUS | Status: DC

## 2013-01-07 MED ORDER — DOCUSATE SODIUM 100 MG PO CAPS
100.0000 mg | ORAL_CAPSULE | Freq: Two times a day (BID) | ORAL | Status: DC
  Administered 2013-01-10 – 2013-01-14 (×5): 100 mg via ORAL
  Filled 2013-01-07 (×6): qty 1

## 2013-01-07 MED ORDER — RIVASTIGMINE 9.5 MG/24HR TD PT24
9.5000 mg | MEDICATED_PATCH | Freq: Every day | TRANSDERMAL | Status: DC
  Administered 2013-01-08 – 2013-01-15 (×8): 9.5 mg via TRANSDERMAL
  Filled 2013-01-07 (×8): qty 1

## 2013-01-07 MED ORDER — SIMVASTATIN 40 MG PO TABS
40.0000 mg | ORAL_TABLET | Freq: Every evening | ORAL | Status: DC
  Administered 2013-01-10 – 2013-01-13 (×3): 40 mg via ORAL
  Filled 2013-01-07 (×9): qty 1

## 2013-01-07 MED ORDER — FINASTERIDE 5 MG PO TABS
5.0000 mg | ORAL_TABLET | Freq: Every day | ORAL | Status: DC
  Administered 2013-01-10 – 2013-01-14 (×5): 5 mg via ORAL
  Filled 2013-01-07 (×9): qty 1

## 2013-01-07 MED ORDER — SODIUM CHLORIDE 0.9 % IJ SOLN: 3.0000 mL | INTRAMUSCULAR | Status: DC | PRN

## 2013-01-07 MED ORDER — TAMSULOSIN HCL 0.4 MG PO CAPS
0.4000 mg | ORAL_CAPSULE | Freq: Every day | ORAL | Status: DC
  Administered 2013-01-10 – 2013-01-13 (×3): 0.4 mg via ORAL
  Filled 2013-01-07 (×9): qty 1

## 2013-01-07 MED ORDER — VITAMIN B-12 1000 MCG PO TABS
1000.0000 ug | ORAL_TABLET | Freq: Every day | ORAL | Status: DC
  Administered 2013-01-10 – 2013-01-12 (×3): 1000 ug via ORAL
  Filled 2013-01-07 (×9): qty 1

## 2013-01-07 MED ORDER — SODIUM CHLORIDE 0.9 % IV BOLUS (SEPSIS)
1000.0000 mL | Freq: Once | INTRAVENOUS | Status: AC
  Administered 2013-01-07: 1000 mL via INTRAVENOUS

## 2013-01-07 MED ORDER — VITAMIN D3 25 MCG (1000 UNIT) PO TABS
1000.0000 [IU] | ORAL_TABLET | Freq: Every day | ORAL | Status: DC
  Administered 2013-01-10 – 2013-01-12 (×3): 1000 [IU] via ORAL
  Filled 2013-01-07 (×9): qty 1

## 2013-01-07 MED ORDER — ENOXAPARIN SODIUM 40 MG/0.4ML ~~LOC~~ SOLN
40.0000 mg | SUBCUTANEOUS | Status: DC
  Administered 2013-01-07 – 2013-01-13 (×7): 40 mg via SUBCUTANEOUS
  Filled 2013-01-07 (×9): qty 0.4

## 2013-01-07 MED ORDER — ACETAMINOPHEN 650 MG RE SUPP: 650.0000 mg | Freq: Four times a day (QID) | RECTAL | Status: DC | PRN

## 2013-01-07 MED ORDER — SODIUM CHLORIDE 0.9 % IV SOLN: 250.0000 mL | INTRAVENOUS | Status: DC | PRN

## 2013-01-07 MED ORDER — MEMANTINE HCL 10 MG PO TABS
10.0000 mg | ORAL_TABLET | Freq: Two times a day (BID) | ORAL | Status: DC
  Administered 2013-01-10 – 2013-01-14 (×8): 10 mg via ORAL
  Filled 2013-01-07 (×17): qty 1

## 2013-01-07 MED ORDER — PANTOPRAZOLE SODIUM 40 MG PO TBEC
40.0000 mg | DELAYED_RELEASE_TABLET | Freq: Every day | ORAL | Status: DC
  Filled 2013-01-07: qty 1

## 2013-01-07 NOTE — H&P (Signed)
[facility] History and Physical  BARTT GONZAGA ZOX:[id number] DOB: 09-Apr-1927 DOA: 01/07/2013  Referring physician: Dahlia Client, PA.  PCP: [name], MD  Specialists: Dr Amada Jupiter.   Chief Complaint: AMS.   HPI: Charles Lambert is a 79 y.o. male with PMH significant for subdural hematoma, dementia, last admission to hospital on 10-13 for AMS and  fever at 102. He was treated at that time for presume encephalitis, meningitis. He was discharge to SNF to finish IV antibiotics. He finish antibiotics and he was ready to be discharge today from SNF when he developed acute AMS. He was being evaluated by discharge nurse at 10 am  when he was notice to be non responsive, no talkative. He grab nurse arm, but he was not following command. Per son patient has been able to walk, carry a conversation, follow command prior this event.  In the ED patient was unresponsive, he grunts to stimuli.  On my evaluation patient Mental status improving. He is answering question, he knows that he is at [facility]. He doesn't remember what happen today. He denies abdominal pain, dyspnea.  No tremors witness by SNF staff.   Review of Systems: negative except as per HPI.   Past Medical History  Diagnosis Date  . Dementia   . Subdural hematoma   . Prostate enlargement   . Pacemaker   . Sinoatrial node dysfunction    Past Surgical History  Procedure Laterality Date  . Burr hole     Social History:  reports that he has never smoked. He does not have any smokeless tobacco history on file. He reports that he does not drink alcohol or use illicit drugs.  No Known Allergies  Family History  Problem Relation Age of Onset  . Hypertension Mother   . Hypertension Father     Prior to Admission medications   Medication Sig Start Date End Date Taking? Authorizing Provider  cholecalciferol (VITAMIN D) 1000 UNITS tablet Take 1,000 Units by mouth daily.   Yes Historical Provider, MD  finasteride  (PROSCAR) 5 MG tablet Take 5 mg by mouth at bedtime.    Yes Historical Provider, MD  memantine (NAMENDA) 10 MG tablet Take 10 mg by mouth 2 (two) times daily.   Yes Historical Provider, MD  omeprazole (PRILOSEC) 20 MG capsule Take 20 mg by mouth daily.   Yes Historical Provider, MD  rivastigmine (EXELON) 9.5 mg/24hr Place 1 patch onto the skin daily.   Yes Historical Provider, MD  saccharomyces boulardii (FLORASTOR) 250 MG capsule Take 250 mg by mouth 2 (two) times daily.   Yes Historical Provider, MD  sertraline (ZOLOFT) 25 MG tablet Take 25 mg by mouth daily.   Yes Historical Provider, MD  simvastatin (ZOCOR) 40 MG tablet Take 40 mg by mouth every evening.   Yes Historical Provider, MD  Tamsulosin HCl (FLOMAX) 0.4 MG CAPS Take 0.4 mg by mouth daily after supper.    Yes Historical Provider, MD  traMADol (ULTRAM) 50 MG tablet Take 25 mg by mouth every 6 (six) hours as needed for pain. Take 1/2 tab every 6 hours as needed for pain   Yes Historical Provider, MD  vitamin B-12 (CYANOCOBALAMIN) 1000 MCG tablet Take 1,000 mcg by mouth daily.   Yes Historical Provider, MD   Physical Exam: Filed Vitals:   01/07/13 1449  BP:   Pulse:   Temp: 97.7 F (36.5 C)  Resp:    General Appearance:    Alert, cooperative, no distress, appears stated age  Head:    Normocephalic, without obvious abnormality, atraumatic  Eyes:    PERRL, conjunctiva/corneas clear, EOM's intact, fundi        Ears:    Normal TM's and external ear canals, both ears  Nose:   Nares normal, septum midline, mucosa normal, no drainage    or sinus tenderness  Throat:   Lips, mucosa, and tongue normal; teeth and gums normal  Neck:   Supple, symmetrical, trachea midline, no adenopathy;       thyroid:  No enlargement/tenderness/nodules; no carotid   bruit or JVD  Back:     Symmetric, no curvature, ROM normal, no CVA tenderness  Lungs:     Clear to auscultation bilaterally, respirations unlabored  Chest wall:    No tenderness or deformity   Heart:    Regular rate and rhythm, S1 and S2 normal, no murmur, rub   or gallop  Abdomen:     Soft, non-tender, bowel sounds active all four quadrants,    no masses, no organomegaly        Extremities:   Extremities normal, atraumatic, no cyanosis or edema  Pulses:   2+ and symmetric all extremities  Skin:   Skin color, texture, turgor normal, no rashes or lesions  Lymph nodes:   Cervical, supraclavicular, and axillary nodes normal  Neurologic:   CNII-XII intact. Normal strength, following command, slow respond.       Labs on Admission:  Basic Metabolic Panel:  Recent Labs Lab 01/07/13 1210  NA 139  K 3.5  CL 103  CO2 27  GLUCOSE 98  BUN 9  CREATININE 1.11  CALCIUM 8.8   Liver Function Tests:  Recent Labs Lab 01/07/13 1210  AST 29  ALT 25  ALKPHOS 52  BILITOT 0.5  PROT 6.3  ALBUMIN 3.1*   No results found for this basename: LIPASE, AMYLASE,  in the last 168 hours No results found for this basename: AMMONIA,  in the last 168 hours CBC:  Recent Labs Lab 01/07/13 1210  WBC 5.9  NEUTROABS 4.3  HGB 11.6*  HCT 34.9*  MCV 92.8  PLT 257   Cardiac Enzymes:  Recent Labs Lab 01/07/13 1210  TROPONINI <0.30    BNP (last 3 results)  Recent Labs  01/07/13 1210  PROBNP 3754.0*   CBG:  Recent Labs Lab 01/07/13 1132  GLUCAP 102*    Radiological Exams on Admission: Ct Head Wo Contrast  01/07/2013   CLINICAL DATA:  Altered mental status.  EXAM: CT HEAD WITHOUT CONTRAST  TECHNIQUE: Contiguous axial images were obtained from the base of the skull through the vertex without intravenous contrast.  COMPARISON:  12/24/2012.  FINDINGS: Prior burr-hole PROCEDURE for drainage of left-sided subdural hematoma.  No intracranial hemorrhage.  Small vessel disease type changes without CT evidence of large acute infarct.  Global atrophy.  No intracranial mass lesion noted on this unenhanced exam.  Vascular calcifications.  IMPRESSION: No acute abnormality. Please see  above.   Electronically Signed   By: Bridgett Larsson M.D.   On: 01/07/2013 13:42   Dg Chest Portable 1 View  01/07/2013   CLINICAL DATA:  Altered mental status.  EXAM: PORTABLE CHEST - 1 VIEW  COMPARISON:  12/29/2012 and 12/28/2012.  FINDINGS: Right PICC line has been removed.  Sequential pacemaker remains in place with leads unchanged in position.  Pulmonary edema. Cardiomegaly. No gross pneumothorax.  Limited for evaluating for underlying infiltrate or mass.  Calcified tortuous aorta.  IMPRESSION: Right PICC line has been removed.  Sequential pacemaker remains in place with leads unchanged in position.  Pulmonary edema. Cardiomegaly.   Electronically Signed   By: Bridgett Larsson M.D.   On: 01/07/2013 11:49    EKG: Independently reviewed. Sinus rhythm.   Assessment/Plan Active Problems:   Pacemaker-[employer]   Dementia   Altered mental status  1-Encephalopathy/AMS: highly in the differential seizure, Unlikely meningitis due to rapidly improvement in mentation. CT head negative for acute stroke. Neurology consulted. EEG ordered. No evidence of infection, no fever, no leukocytosis, UA negative, UDS negative. Patient was started on keppra. Swallow evaluation.   2-Dementia: Continue with current medications.  3-Hyperlipidemia: Continue with Zocor.  4-Sleep apnea; hold on BIPAP for now in case seizure.     Code Status: Patient is DNR, discussed with son. Patient does not want to be on live support.  Family Communication: Care discussed with son, who was at bedside.  Disposition Plan: expect one or 2 days depending on clinical improvement and test results.   Time spent: 65 minutes.   Trumaine Wimer [facility] Pager [phone]  If 7PM-7AM, please contact night-coverage [email or web address] Password [email or web address] 01/07/2013, 3:40 PM

## 2013-01-07 NOTE — ED Notes (Addendum)
Pt. Was being discharged from a nursing care home and the staff reports that pt  had altered LOC.  Pt had a PICC line that also removed today.  The area is swollen and red, appears to be a cellulitis.  Pt. Will not follow command or open eyes.  When you ask him to squeeze your fingers he does and when asked to let go he squeezes harder.  Pt. 's skin is warm and dry. Airway patent

## 2013-01-07 NOTE — ED Notes (Signed)
Changed pt.s diaper, peri-care done.  Clean adult diaper applied, sheets changed.

## 2013-01-07 NOTE — ED Notes (Signed)
Family at the bedside.

## 2013-01-07 NOTE — ED Notes (Signed)
Pt. Is waking up. Pt. Opened up eyes and is responding and answering questions.

## 2013-01-07 NOTE — ED Notes (Signed)
  CBG 103  

## 2013-01-07 NOTE — ED Notes (Signed)
EEG will be completed on patient in AM on 10/30

## 2013-01-07 NOTE — ED Notes (Signed)
Unable to do a stroke swallow screen
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
## 2013-01-07 NOTE — Progress Notes (Signed)
Patient arrived to room via bed. He is alert, appears comfortable. Denied any pain. Son at bedside. Order reviewed with son. Safety precautions reviewed with patient and son. Call light within reach. Bed on lowest positioned. Alarm activated. Will continue to monitor.    Chani Ghanem. 01/07/13 1700

## 2013-01-07 NOTE — ED Provider Notes (Signed)
CSN: [id number]     Arrival date & time 01/07/13  1048 History   First MD Initiated Contact with Patient 01/07/13 1114     Chief Complaint  Patient presents with  . Altered Mental Status   (Consider location/radiation/quality/duration/timing/severity/associated sxs/prior Treatment) HPI Comments: Patient presents to the emergency department with chief complaint of altered mental status. Patient was being discharged from the nursing home. He had been staying there to receive antibiotic therapy for suspected meningitis. He had a PICC line that was removed today. It was after the PICC line was removed the patient became altered. He does not follow commands. Level 5 caveat applies secondary to altered mental status.  The history is provided by the patient. No language interpreter was used.    Past Medical History  Diagnosis Date  . Dementia   . Subdural hematoma   . Prostate enlargement   . Pacemaker   . Sinoatrial node dysfunction    Past Surgical History  Procedure Laterality Date  . Burr hole     Family History  Problem Relation Age of Onset  . Hypertension Mother   . Hypertension Father    History  Substance Use Topics  . Smoking status: Never Smoker   . Smokeless tobacco: Not on file  . Alcohol Use: No    Review of Systems  Unable to perform ROS: Mental status change    Allergies  Review of patient's allergies indicates no known allergies.  Home Medications   Current Outpatient Rx  Name  Route  Sig  Dispense  Refill  . cholecalciferol (VITAMIN D) 1000 UNITS tablet   Oral   Take 1,000 Units by mouth daily.         . finasteride (PROSCAR) 5 MG tablet   Oral   Take 5 mg by mouth at bedtime.          . memantine (NAMENDA) 10 MG tablet   Oral   Take 10 mg by mouth 2 (two) times daily.         Marland Kitchen omeprazole (PRILOSEC) 20 MG capsule   Oral   Take 20 mg by mouth daily.         . rivastigmine (EXELON) 9.5 mg/24hr   Transdermal   Place 1 patch onto the  skin daily.         Marland Kitchen saccharomyces boulardii (FLORASTOR) 250 MG capsule   Oral   Take 250 mg by mouth 2 (two) times daily.         . sertraline (ZOLOFT) 25 MG tablet   Oral   Take 25 mg by mouth daily.         . simvastatin (ZOCOR) 40 MG tablet   Oral   Take 40 mg by mouth every evening.         . Tamsulosin HCl (FLOMAX) 0.4 MG CAPS   Oral   Take 0.4 mg by mouth daily after supper.          . traMADol (ULTRAM) 50 MG tablet   Oral   Take 25 mg by mouth every 6 (six) hours as needed for pain. Take 1/2 tab every 6 hours as needed for pain         . vitamin B-12 (CYANOCOBALAMIN) 1000 MCG tablet   Oral   Take 1,000 mcg by mouth daily.          BP 152/76  Pulse 62  Temp(Src) 98.4 F (36.9 C) (Rectal)  Resp 24  SpO2 94% Physical Exam  Nursing  note and vitals reviewed. Constitutional: He appears well-developed and well-nourished.  HENT:  Head: Normocephalic and atraumatic.  Eyes: Conjunctivae and EOM are normal. Pupils are equal, round, and reactive to light. Right eye exhibits no discharge. Left eye exhibits no discharge. No scleral icterus.  Neck: Normal range of motion. Neck supple. No JVD present.  Cardiovascular: Normal rate, regular rhythm, normal heart sounds and intact distal pulses.  Exam reveals no gallop and no friction rub.   No murmur heard. Pulmonary/Chest: Effort normal and breath sounds normal. No respiratory distress. He has no wheezes. He has no rales. He exhibits no tenderness.  Abdominal: Soft. He exhibits no distension and no mass. There is no tenderness. There is no rebound and no guarding.  Patient grunts with palpation of abdomen no masses, no signs of fluid wave   Musculoskeletal: Normal range of motion. He exhibits no edema and no tenderness.  Neurological:  Unresponsive  Skin: Skin is warm and dry.  Psychiatric: He has a normal mood and affect. His behavior is normal. Judgment and thought content normal.    ED Course  Procedures  (including critical care time) Labs Review Labs Reviewed  CULTURE, BLOOD (ROUTINE X 2)  CULTURE, BLOOD (ROUTINE X 2)  CBC WITH DIFFERENTIAL  COMPREHENSIVE METABOLIC PANEL  URINALYSIS, ROUTINE W REFLEX MICROSCOPIC  ETHANOL  PROTIME-INR  APTT  TROPONIN I  URINE RAPID DRUG SCREEN (HOSP PERFORMED)   Results for orders placed during the hospital encounter of 01/07/13  CBC WITH DIFFERENTIAL      Result Value Range   WBC 5.9  4.0 - 10.5 K/uL   RBC 3.76 (*) 4.22 - 5.81 MIL/uL   Hemoglobin 11.6 (*) 13.0 - 17.0 g/dL   HCT 62.1 (*) 30.8 - 65.7 %   MCV 92.8  78.0 - 100.0 fL   MCH 30.9  26.0 - 34.0 pg   MCHC 33.2  30.0 - 36.0 g/dL   RDW 84.6  96.2 - 95.2 %   Platelets 257  150 - 400 K/uL   Neutrophils Relative % 73  43 - 77 %   Neutro Abs 4.3  1.7 - 7.7 K/uL   Lymphocytes Relative 14  12 - 46 %   Lymphs Abs 0.8  0.7 - 4.0 K/uL   Monocytes Relative 9  3 - 12 %   Monocytes Absolute 0.5  0.1 - 1.0 K/uL   Eosinophils Relative 3  0 - 5 %   Eosinophils Absolute 0.2  0.0 - 0.7 K/uL   Basophils Relative 1  0 - 1 %   Basophils Absolute 0.0  0.0 - 0.1 K/uL  COMPREHENSIVE METABOLIC PANEL      Result Value Range   Sodium 139  135 - 145 mEq/L   Potassium 3.5  3.5 - 5.1 mEq/L   Chloride 103  96 - 112 mEq/L   CO2 27  19 - 32 mEq/L   Glucose, Bld 98  70 - 99 mg/dL   BUN 9  6 - 23 mg/dL   Creatinine, Ser 8.41  0.50 - 1.35 mg/dL   Calcium 8.8  8.4 - 32.4 mg/dL   Total Protein 6.3  6.0 - 8.3 g/dL   Albumin 3.1 (*) 3.5 - 5.2 g/dL   AST 29  0 - 37 U/L   ALT 25  0 - 53 U/L   Alkaline Phosphatase 52  39 - 117 U/L   Total Bilirubin 0.5  0.3 - 1.2 mg/dL   GFR calc non Af Amer 59 (*) >90  mL/min   GFR calc Af Amer 69 (*) >90 mL/min  ETHANOL      Result Value Range   Alcohol, Ethyl (B) <11  0 - 11 mg/dL  PROTIME-INR      Result Value Range   Prothrombin Time 14.9  11.6 - 15.2 seconds   INR 1.20  0.00 - 1.49  APTT      Result Value Range   aPTT 40 (*) 24 - 37 seconds  TROPONIN I      Result  Value Range   Troponin I <0.30  <0.30 ng/mL  LACTIC ACID, PLASMA      Result Value Range   Lactic Acid, Venous 0.8  0.5 - 2.2 mmol/L  GLUCOSE, CAPILLARY      Result Value Range   Glucose-Capillary 102 (*) 70 - 99 mg/dL  PRO B NATRIURETIC PEPTIDE      Result Value Range   Pro B Natriuretic peptide (BNP) 3754.0 (*) 0 - 450 pg/mL  POCT I-STAT TROPONIN I      Result Value Range   Troponin i, poc 0.04  0.00 - 0.08 ng/mL   Comment 3            Ct Head Wo Contrast  01/07/2013   CLINICAL DATA:  Altered mental status.  EXAM: CT HEAD WITHOUT CONTRAST  TECHNIQUE: Contiguous axial images were obtained from the base of the skull through the vertex without intravenous contrast.  COMPARISON:  12/24/2012.  FINDINGS: Prior burr-hole PROCEDURE for drainage of left-sided subdural hematoma.  No intracranial hemorrhage.  Small vessel disease type changes without CT evidence of large acute infarct.  Global atrophy.  No intracranial mass lesion noted on this unenhanced exam.  Vascular calcifications.  IMPRESSION: No acute abnormality. Please see above.   Electronically Signed   By: Bridgett Larsson M.D.   On: 01/07/2013 13:42   Ct Head Wo Contrast  12/24/2012   CLINICAL DATA:  Confusion.  EXAM: CT HEAD WITHOUT CONTRAST  TECHNIQUE: Contiguous axial images were obtained from the base of the skull through the vertex without intravenous contrast.  COMPARISON:  12/22/2012 and 06/16/2012.  FINDINGS: Prior for burrhole procedure for drainage of left-sided subdural hematoma as per history provided.  No intracranial hemorrhage.  Small vessel disease type changes without CT evidence of large acute infarct.  Global atrophy without hydrocephalus.  No intracranial mass lesion noted on this unenhanced exam.  Vascular calcifications.  IMPRESSION: No acute abnormality. Please see above.   Electronically Signed   By: Bridgett Larsson M.D.   On: 12/24/2012 12:54   Ct Head Wo Contrast  12/22/2012   CLINICAL DATA:  Altered mental status.  Fall. Head trauma. Prior subdural hematoma.  EXAM: CT HEAD WITHOUT CONTRAST  TECHNIQUE: Contiguous axial images were obtained from the base of the skull through the vertex without intravenous contrast.  COMPARISON:  06/16/2012  FINDINGS: There is no evidence of intracranial hemorrhage, brain edema, or other signs of acute infarction. There is no evidence of intracranial mass lesion or mass effect. No abnormal extraaxial fluid collections are identified.  Mild to moderate diffuse cerebral atrophy is stable as well as mild chronic small vessel disease. Ventricles are stable in size. No evidence of acute skull fracture. 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  IMPRESSION: No acute findings.  Stable cerebral atrophy and chronic small vessel disease.   Electronically Signed   By: Myles Rosenthal M.D.   On: 12/22/2012 21:34   Ir US Guide Vasc Access Right  12/29/2012  CLINICAL DATA:  Encephalitis and need for venous access. A previously placed PICC line has been completely removed by the patient.  EXAM: RIGHT UPPER EXTREMITY PICC LINE PLACEMENT WITH ULTRASOUND AND FLUOROSCOPIC GUIDANCE  FLUOROSCOPY TIME:  12 seconds.  PROCEDURE: The patient's son was advised of the possible risks and complications and agreed to undergo the procedure. The patient was then brought to the angiographic suite for the procedure.  The right arm was prepped with chlorhexidine, draped in the usual sterile fashion using maximum barrier technique (cap and mask, sterile gown, sterile gloves, large sterile sheet, hand hygiene and cutaneous antisepsis) and infiltrated locally with 1% Lidocaine.  Ultrasound demonstrated patency of the right basilic vein, and this was documented with an image. Under real-time ultrasound guidance, this vein was accessed with a 21 gauge micropuncture needle and image documentation was performed. A 0.018 wire was introduced in to the vein. Over this, a 5 [place] dual lumen power injectable PICC cut to 41 cm length  was advanced to the lower SVC/right atrial junction. Fluoroscopy during the procedure and fluoro spot radiograph confirms appropriate catheter position. The catheter was flushed and covered with a sterile dressing.  Complications: None  IMPRESSION: Successful right arm power injectable PICC line placement with ultrasound and fluoroscopic guidance. The catheter is ready for use.   Electronically Signed   By: Irish Lack M.D.   On: 12/29/2012 14:05   Dg Chest Portable 1 View  01/07/2013   CLINICAL DATA:  Altered mental status.  EXAM: PORTABLE CHEST - 1 VIEW  COMPARISON:  12/29/2012 and 12/28/2012.  FINDINGS: Right PICC line has been removed.  Sequential pacemaker remains in place with leads unchanged in position.  Pulmonary edema. Cardiomegaly. No gross pneumothorax.  Limited for evaluating for underlying infiltrate or mass.  Calcified tortuous aorta.  IMPRESSION: Right PICC line has been removed.  Sequential pacemaker remains in place with leads unchanged in position.  Pulmonary edema. Cardiomegaly.   Electronically Signed   By: Bridgett Larsson M.D.   On: 01/07/2013 11:49   Dg Chest Port 1 View  12/28/2012   CLINICAL DATA:  PICC line placement.  EXAM: PORTABLE CHEST - 1 VIEW  COMPARISON:  12/26/2012.  FINDINGS: The right PICC line tip is approximately 3 cm below the region of the cavoatrial junction and in the right atrium. The pacer wires are stable. Stable cardiac enlargement, vascular congestion, bibasilar infiltrates and pleural effusions.  IMPRESSION: The right PICC line tip is in the right atrium approximately 3 cm below the region of the cavoatrial junction.   Electronically Signed   By: Loralie Champagne M.D.   On: 12/28/2012 12:55   Dg Chest Port 1 View  12/26/2012   CLINICAL DATA:  Cough and congestion.  EXAM: PORTABLE CHEST - 1 VIEW  COMPARISON:  12/22/2012.  FINDINGS: The heart is upper limits of normal and stable. The mediastinal and hilar contours are unchanged. There is tortuosity and  calcification of the thoracic aorta. The pacer wires are stable. There are bibasilar infiltrates and small effusions.  IMPRESSION: Bibasilar infiltrates and small effusions.   Electronically Signed   By: Loralie Champagne M.D.   On: 12/26/2012 18:25   Dg Chest Port 1 View  12/22/2012   CLINICAL DATA:  Altered mental status. Weakness.  EXAM: PORTABLE CHEST - 1 VIEW  COMPARISON:  06/16/2012  FINDINGS: Low lung volumes noted. Heart size within normal limits. Both lungs are clear. Dual lead transvenous pacemaker remains in appropriate position.  IMPRESSION: No acute findings.  Electronically Signed   By: Myles Rosenthal M.D.   On: 12/22/2012 20:17   Ir Fluoro Guide Cv Midline Picc Right  12/29/2012   CLINICAL DATA:  Encephalitis and need for venous access. A previously placed PICC line has been completely removed by the patient.  EXAM: RIGHT UPPER EXTREMITY PICC LINE PLACEMENT WITH ULTRASOUND AND FLUOROSCOPIC GUIDANCE  FLUOROSCOPY TIME:  12 seconds.  PROCEDURE: The patient's son was advised of the possible risks and complications and agreed to undergo the procedure. The patient was then brought to the angiographic suite for the procedure.  The right arm was prepped with chlorhexidine, draped in the usual sterile fashion using maximum barrier technique (cap and mask, sterile gown, sterile gloves, large sterile sheet, hand hygiene and cutaneous antisepsis) and infiltrated locally with 1% Lidocaine.  Ultrasound demonstrated patency of the right basilic vein, and this was documented with an image. Under real-time ultrasound guidance, this vein was accessed with a 21 gauge micropuncture needle and image documentation was performed. A 0.018 wire was introduced in to the vein. Over this, a 5 [place] dual lumen power injectable PICC cut to 41 cm length was advanced to the lower SVC/right atrial junction. Fluoroscopy during the procedure and fluoro spot radiograph confirms appropriate catheter position. The catheter was  flushed and covered with a sterile dressing.  Complications: None  IMPRESSION: Successful right arm power injectable PICC line placement with ultrasound and fluoroscopic guidance. The catheter is ready for use.   Electronically Signed   By: Irish Lack M.D.   On: 12/29/2012 14:05   Dg Fluoro Guide Lumbar Puncture  12/25/2012   CLINICAL DATA:  Fever. Altered mental status.  EXAM: DIAGNOSTIC LUMBAR PUNCTURE UNDER FLUOROSCOPIC GUIDANCE  FLUOROSCOPY TIME:  45 seconds.  PROCEDURE: Informed consent was obtained from the patient prior to the procedure, including potential complications of headache, allergy, and pain. With the patient prone, the lower back was prepped with Betadine. 1% Lidocaine was used for local anesthesia. Lumbar puncture was performed at the L3-4 level using a gauge needle with return of clear CSF with an opening pressure of 21 cm water. 11.60ml of CSF were obtained for laboratory studies. The patient tolerated the procedure well and there were no apparent complications.  IMPRESSION: Successful lumbar puncture as above.   Electronically Signed   By: Drusilla Kanner M.D.   On: 12/25/2012 11:31      EKG Interpretation     Ventricular Rate:  60 PR Interval:  83 QRS Duration: 89 QT Interval:  470 QTC Calculation: 470 R Axis:   89 Text Interpretation:  Sinus rhythm Short PR interval Borderline right axis deviation Nonspecific T abnormalities, lateral leads Baseline wander in lead(s) V6 Compared to previous tracing  chages in anterior t waves          -Compared to prior, anterior T-wave changes  MDM   1. Altered mental status     In response patient. Will initiate broad altered mental status workup. Will start fluids, and will reevaluate. Patient does not currently meet SIRS criteria.  Patient seen by and discussed with Dr. Deretha Emory.  Labs are fairly reassuring, however the patient is still unresponsive. Will admit to the hospital.  Hospitalist asks for neuro  consult. I will contact neurology and have them see the patient and offered formal consult.    Roxy Horseman, PA-C 01/07/13 1501

## 2013-01-08 ENCOUNTER — Inpatient Hospital Stay (HOSPITAL_COMMUNITY): Payer: Medicare Other

## 2013-01-08 DIAGNOSIS — E876 Hypokalemia: Secondary | ICD-10-CM

## 2013-01-08 DIAGNOSIS — R569 Unspecified convulsions: Principal | ICD-10-CM

## 2013-01-08 DIAGNOSIS — G9340 Encephalopathy, unspecified: Secondary | ICD-10-CM

## 2013-01-08 LAB — BASIC METABOLIC PANEL
BUN: 9 mg/dL (ref 6–23)
CO2: 25 mEq/L (ref 19–32)
Calcium: 8.5 mg/dL (ref 8.4–10.5)
Creatinine, Ser: 1.03 mg/dL (ref 0.50–1.35)
GFR calc non Af Amer: 65 mL/min — ABNORMAL LOW (ref >90)
Glucose, Bld: 84 mg/dL (ref 70–99)
Sodium: 140 mEq/L (ref 135–145)

## 2013-01-08 MED ORDER — POTASSIUM CHLORIDE IN NACL 40-0.9 MEQ/L-% IV SOLN
INTRAVENOUS | Status: AC
  Administered 2013-01-08: 12:00:00 via INTRAVENOUS
  Filled 2013-01-08 (×2): qty 1000

## 2013-01-08 MED ORDER — SODIUM CHLORIDE 0.9 % IV SOLN
250.0000 mg | Freq: Two times a day (BID) | INTRAVENOUS | Status: DC
  Administered 2013-01-08 – 2013-01-11 (×6): 250 mg via INTRAVENOUS
  Filled 2013-01-08 (×7): qty 2.5

## 2013-01-08 MED ORDER — LORAZEPAM 2 MG/ML IJ SOLN
0.5000 mg | Freq: Once | INTRAMUSCULAR | Status: AC
  Administered 2013-01-08: 0.5 mg via INTRAVENOUS

## 2013-01-08 MED ORDER — INFLUENZA VAC SPLIT QUAD 0.5 ML IM SUSP
0.5000 mL | INTRAMUSCULAR | Status: DC
  Filled 2013-01-08: qty 0.5

## 2013-01-08 MED ORDER — FUROSEMIDE 10 MG/ML IJ SOLN
20.0000 mg | Freq: Once | INTRAMUSCULAR | Status: AC
  Administered 2013-01-08: 20 mg via INTRAVENOUS
  Filled 2013-01-08: qty 2

## 2013-01-08 MED ORDER — PANTOPRAZOLE SODIUM 40 MG IV SOLR
40.0000 mg | Freq: Every day | INTRAVENOUS | Status: DC
  Administered 2013-01-08 – 2013-01-10 (×3): 40 mg via INTRAVENOUS
  Filled 2013-01-08 (×3): qty 40

## 2013-01-08 MED ORDER — SODIUM CHLORIDE 0.9 % IV SOLN
INTRAVENOUS | Status: DC
  Administered 2013-01-08: 11:00:00 via INTRAVENOUS

## 2013-01-08 MED ORDER — LORAZEPAM 2 MG/ML IJ SOLN
INTRAMUSCULAR | Status: AC
  Filled 2013-01-08: qty 1

## 2013-01-08 MED ORDER — ALBUTEROL SULFATE (5 MG/ML) 0.5% IN NEBU: 2.5000 mg | INHALATION_SOLUTION | RESPIRATORY_TRACT | Status: DC | PRN

## 2013-01-08 NOTE — Procedures (Signed)
History: 79 yo M with two episodes of staring with post-ictal period.   Sedation: Ativan 0.5mg  6- 8 hours prior to study  Background: The background consists of predominantly low voltage delta activity. There is some interspersed beta as well. Toward the end of the recording, there is a single sharply contoured wave arising from F3 greater than F7 greater than Fp1.   Photic stimulation: Physiologic driving is not performed   EEG Abnormalities: 1) single left frontal sharp wave  Clinical Interpretation: This EEG is suggestive of an area of potential epileptogenicity in the left frontal region. There is also evidence of a mild nonspecific encephalopathy. There was no seizure recorded on this study.   Ritta Slot, MD [facility] [phone]  If 7pm- 7am, please page neurology on call at [phone].

## 2013-01-08 NOTE — Progress Notes (Signed)
Subjective: Patient became agitated overnight, got 0.5mg  ativan and has been lethargic since that time. Son reports similar response rpeviously.   Exam: Filed Vitals:   01/08/13 1103  BP: 156/74  Pulse: 61  Temp: 97.5 F (36.4 C)  Resp: 18   Gen: In bed, NAD MS: Does not open eyes, but has clearly concious movements of the left hand. With noxious stimulation, does respond and give his name.  ZO:XWRUE, fixates and tracks across midline in both directions.  Motor: moves allextremities spontaneously and to nox stim Sensory:as above.   Impression: 79 year old male with dementia as well as previous SDH who presents with episode of altered mental status. He's had at least one episode of staring spell in the past as well. Given the multiple episodes, high risk of the patient, and postictal period, I suspect that these do represent seizures. His current sedation is likely ativan related, though keppra could play a roll as well.   Recommendations: 1) Decrease keppra to 250mg  BID.  2) Will continue to follow   Ritta Slot, MD [facility] [phone]  If 7pm- 7am, please page neurology on call at [phone].

## 2013-01-08 NOTE — Progress Notes (Signed)
Patient admitted from [facility] where he has been for the past week receiving IV ABX- the plan was for d/c back to [facility] where he resides on the day of this admission- he now is sedated (?from RX) and will need to be assessed for appropriate level of care at d/c  (SNF -vs- ALF). Recommend PT eval to help with this determination. Patient's 2 sons are very involved and agree with this plan- CSW covering tomorrow to further assess for d/c plan and secure bed for him. FL2 on chart for MD signature-  Reece Levy, MSW, [place] [phone]

## 2013-01-08 NOTE — Progress Notes (Signed)
Unable to to swallow screen due to patient is so lethargic from ativan being administered over the night. Charles Lambert

## 2013-01-08 NOTE — Progress Notes (Signed)
EEG Completed; Results Pending  

## 2013-01-08 NOTE — Progress Notes (Signed)
[facility] PROGRESS NOTE  Charles Lambert:[id number] DOB: December 06, 1927 DOA: 01/07/2013 PCP: [name], MD  HPI/Brief narrative 79 y.o. male with known dementia and history of SDH s/p evacuation. Patient resides in the dementia unit who was recently hospitalized for AMS --at that time it was noted "RN noted that he was completely unresponsive when she went in to evaluate him but several minutes later he was talking and responding normally." At that time patient was brought to [facility] hospital and he was noted to have a Temperature of 102. There was concern for infection and meningitis. He was started on empiric ABX and LP was negative and cultures were also negative. Due to one culture growing gram negative cocci ID was consulted and they believed it was contaminant. Patient slowly improved. Patient was discharged to SNF with PICC line. Today his PICC line was removed from nursing staff and he was being discharged back to the memory unit. After PICC line was removed he was noted to have a Staring spell and not respond to staff. Patient apparently did squeeze staffs fingers but would not let go. At time of consultation he was confused at place and date (normal for his baseline) but obeyed all commands and was able to interact without difficulty.   Assessment/Plan:  Acute encephalopathy/suspect seizures - Neurology consultation and followup appreciated. - Patient was initially started on Keppra 500 mg twice a day - Overnight 10/29, patient received 0.5 mg of Ativan x1 for agitation and has been somnolent since. - Neurology has reduced Keppra to 250 twice a day and recommend avoiding any further Ativan. Patient apparently had similar prolonged drowsy spells from smallest doses of Ativan in the past. - N.p.o. until patient is alert and undergo swallow evaluation by ST. - Continue brief IV fluids.  Hypokalemia -Repleted IV fluids and follow BMP   Dementia - Altered mental status at this time  secondary to problem #1  OSA - Recent ABG in mid-October showed no CO2 retention. - Continue nightly CPAP.  Chest congestion - Chest x-ray suggested pulmonary edema but patient clinically does not seem like it. - Trial of small dose of IV Lasix - Followup chest x-ray in the morning. Minimize fluids  Anemia - Follow CBC  Recent subdural hematoma  DVT prophylaxis: SCDs Lines/catheters: PIV Nutrition: NPO  Activity:  Up with assistance Code Status: DNR Family Communication: Discussed with son at bedside Disposition Plan: Return to memory care unit when medically stable.   Consultants:  Neurology  Procedures:  EEG  Antibiotics:  None   Subjective: Patient somnolent. As per nursing, patient was agitated, tempting to get out of bed last night and received a dose of Ativan. Since then patient has been somnolent. Some congestion in the throat and unable to clear secretions.  Objective: Filed Vitals:   01/08/13 0200 01/08/13 1103 01/08/13 1358 01/08/13 1425  BP: 169/66 156/74 175/77   Pulse: 59 61 61   Temp: 97.9 F (36.6 C) 97.5 F (36.4 C) 97.8 F (36.6 C)   TempSrc:  Oral Oral   Resp: 20 18 17    Height:    5\' 11"  (1.803 m)  Weight:    97.342 kg (214 lb 9.6 oz)  SpO2: 87% 95% 94%     Intake/Output Summary (Last 24 hours) at 01/08/13 1840 Last data filed at 01/08/13 1350  Gross per 24 hour  Intake      0 ml  Output    300 ml  Net   -300 ml  Filed Weights   01/08/13 1425  Weight: 97.342 kg (214 lb 9.6 oz)     Exam:  General exam: Moderately built and nourished elderly male lying comfortably supine in bed. Respiratory system: Clear. No increased work of breathing. Cardiovascular system: S1 & S2 heard, RRR. No JVD, murmurs, gallops, clicks or pedal edema. Gastrointestinal system: Abdomen is nondistended, soft and nontender. Normal bowel sounds heard. Central nervous system: Patient is drowsy. Does not open eyes. Spontaneously moves all 4 extremities  and to noxious stimuli, localizes with upper extremities. No focal neurological deficits. Extremities: As above.   Data Reviewed: Basic Metabolic Panel:  Recent Labs Lab 01/07/13 1210 01/08/13 0605  NA 139 140  K 3.5 3.4*  CL 103 105  CO2 27 25  GLUCOSE 98 84  BUN 9 9  CREATININE 1.11 1.03  CALCIUM 8.8 8.5   Liver Function Tests:  Recent Labs Lab 01/07/13 1210  AST 29  ALT 25  ALKPHOS 52  BILITOT 0.5  PROT 6.3  ALBUMIN 3.1*   No results found for this basename: LIPASE, AMYLASE,  in the last 168 hours No results found for this basename: AMMONIA,  in the last 168 hours CBC:  Recent Labs Lab 01/07/13 1210  WBC 5.9  NEUTROABS 4.3  HGB 11.6*  HCT 34.9*  MCV 92.8  PLT 257   Cardiac Enzymes:  Recent Labs Lab 01/07/13 1210  TROPONINI <0.30   BNP (last 3 results)  Recent Labs  01/07/13 1210  PROBNP 3754.0*   CBG:  Recent Labs Lab 01/07/13 1132  GLUCAP 102*    Recent Results (from the past 240 hour(s))  CULTURE, BLOOD (ROUTINE X 2)     Status: None   Collection Time    01/07/13 12:10 PM      Result Value Range Status   Specimen Description BLOOD LEFT ANTECUBITAL   Final   Special Requests BOTTLES DRAWN AEROBIC AND ANAEROBIC 10CC   Final   Culture  Setup Time     Final   Value: 01/07/2013 23:39     Performed at [facility]   Culture     Final   Value:        BLOOD CULTURE RECEIVED NO GROWTH TO DATE CULTURE WILL BE HELD FOR 5 DAYS BEFORE ISSUING A FINAL NEGATIVE REPORT     Performed at [facility]   Report Status PENDING   Incomplete  CULTURE, BLOOD (ROUTINE X 2)     Status: None   Collection Time    01/07/13 12:15 PM      Result Value Range Status   Specimen Description BLOOD HAND LEFT   Final   Special Requests BOTTLES DRAWN AEROBIC ONLY 10CC   Final   Culture  Setup Time     Final   Value: 01/07/2013 23:38     Performed at [facility]   Culture     Final   Value:        BLOOD CULTURE RECEIVED NO GROWTH TO  DATE CULTURE WILL BE HELD FOR 5 DAYS BEFORE ISSUING A FINAL NEGATIVE REPORT     Performed at [facility]   Report Status PENDING   Incomplete      Additional labs: 1. None     Studies: Ct Head Wo Contrast  01/07/2013   CLINICAL DATA:  Altered mental status.  EXAM: CT HEAD WITHOUT CONTRAST  TECHNIQUE: Contiguous axial images were obtained from the base of the skull through the vertex without intravenous contrast.  COMPARISON:  12/24/2012.  FINDINGS: Prior burr-hole PROCEDURE for drainage of left-sided subdural hematoma.  No intracranial hemorrhage.  Small vessel disease type changes without CT evidence of large acute infarct.  Global atrophy.  No intracranial mass lesion noted on this unenhanced exam.  Vascular calcifications.  IMPRESSION: No acute abnormality. Please see above.   Electronically Signed   By: Bridgett Larsson M.D.   On: 01/07/2013 13:42   Dg Chest Portable 1 View  01/07/2013   CLINICAL DATA:  Altered mental status.  EXAM: PORTABLE CHEST - 1 VIEW  COMPARISON:  12/29/2012 and 12/28/2012.  FINDINGS: Right PICC line has been removed.  Sequential pacemaker remains in place with leads unchanged in position.  Pulmonary edema. Cardiomegaly. No gross pneumothorax.  Limited for evaluating for underlying infiltrate or mass.  Calcified tortuous aorta.  IMPRESSION: Right PICC line has been removed.  Sequential pacemaker remains in place with leads unchanged in position.  Pulmonary edema. Cardiomegaly.   Electronically Signed   By: Bridgett Larsson M.D.   On: 01/07/2013 11:49        Scheduled Meds: . cholecalciferol  1,000 Units Oral Daily  . docusate sodium  100 mg Oral BID  . enoxaparin (LOVENOX) injection  40 mg Subcutaneous Q24H  . finasteride  5 mg Oral QHS  . [START ON 01/09/2013] influenza vac split quadrivalent PF  0.5 mL Intramuscular Tomorrow-1000  . levETIRAcetam  250 mg Intravenous Q12H  . memantine  10 mg Oral BID  . pantoprazole (PROTONIX) IV  40 mg Intravenous Daily   . rivastigmine  9.5 mg Transdermal Daily  . saccharomyces boulardii  250 mg Oral BID  . sertraline  25 mg Oral Daily  . simvastatin  40 mg Oral QPM  . sodium chloride  3 mL Intravenous Q12H  . tamsulosin  0.4 mg Oral QPC supper  . vitamin B-12  1,000 mcg Oral Daily   Continuous Infusions: . 0.9 % NaCl with KCl 40 mEq / L 75 mL/hr at 01/08/13 1214    Active Problems:   Pacemaker-[employer]   Dementia   Altered mental status   Encephalopathy acute    Time spent: 40 minutes.    Marcellus Scott, MD, FACP, FHM. [facility] Pager [phone]  If 7PM-7AM, please contact night-coverage [email or web address] Password [email or web address] 01/08/2013, 6:40 PM    LOS: 1 day

## 2013-01-08 NOTE — Progress Notes (Signed)
SLP Cancellation Note  Patient Details Name: Charles Lambert MRN: [id number] DOB: 04-02-1927   Cancelled treatment:       Reason Eval/Treat Not Completed: Fatigue/lethargy limiting ability to participate Patient received medication over night that appears to still be impacting level of arousal and as a result, SLP unable to complete BSE.  Will follow up as able.   Fae Pippin, M.A., CCC-SLP [phone]  Shaneece Stockburger 01/08/2013, 11:02 AM

## 2013-01-09 ENCOUNTER — Inpatient Hospital Stay (HOSPITAL_COMMUNITY): Payer: Medicare Other

## 2013-01-09 DIAGNOSIS — I509 Heart failure, unspecified: Secondary | ICD-10-CM

## 2013-01-09 DIAGNOSIS — I517 Cardiomegaly: Secondary | ICD-10-CM

## 2013-01-09 LAB — BASIC METABOLIC PANEL
CO2: 27 mEq/L (ref 19–32)
Calcium: 8.8 mg/dL (ref 8.4–10.5)
Creatinine, Ser: 1.08 mg/dL (ref 0.50–1.35)
GFR calc non Af Amer: 61 mL/min — ABNORMAL LOW (ref >90)
Sodium: 141 mEq/L (ref 135–145)

## 2013-01-09 LAB — CBC
MCH: 31.1 pg (ref 26.0–34.0)
MCHC: 33.5 g/dL (ref 30.0–36.0)
MCV: 92.8 fL (ref 78.0–100.0)
Platelets: 264 10*3/uL (ref 150–400)
RBC: 3.89 MIL/uL — ABNORMAL LOW (ref 4.22–5.81)
RDW: 13.4 % (ref 11.5–15.5)

## 2013-01-09 LAB — PRO B NATRIURETIC PEPTIDE: Pro B Natriuretic peptide (BNP): 5496 pg/mL — ABNORMAL HIGH (ref 0–450)

## 2013-01-09 MED ORDER — FUROSEMIDE 10 MG/ML IJ SOLN
40.0000 mg | Freq: Once | INTRAMUSCULAR | Status: AC
  Administered 2013-01-09: 40 mg via INTRAVENOUS
  Filled 2013-01-09: qty 4

## 2013-01-09 NOTE — Progress Notes (Signed)
Echocardiogram 2D Echocardiogram has been performed.  Charles Lambert 01/09/2013, 2:38 PM

## 2013-01-09 NOTE — Progress Notes (Signed)
Clinical Social Work Department BRIEF PSYCHOSOCIAL ASSESSMENT 01/09/2013  Patient:  DEMAREA, LOREY     Account Number:  [id number]     Admit date:  01/07/2013  Clinical Social Worker:  Lourdes Sledge  Date/Time:  01/09/2013 10:50 AM  Referred by:  Physician  Date Referred:  01/09/2013 Referred for  SNF Placement   Other Referral:   Interview type:  Family Other interview type:   Sons    PSYCHOSOCIAL DATA Living Status:  FACILITY Admitted from facility:  [facility] Level of care:  Skilled Nursing Facility Primary support name:   Primary support relationship to patient:  CHILD, ADULT Degree of support available:   Pt sons appear involved in pt care.    CURRENT CONCERNS Current Concerns  Post-Acute Placement   Other Concerns:    SOCIAL WORK ASSESSMENT / PLAN Patient admitted from [facility] where he has been for the past week receiving IV ABX- the plan was for d/c back to [facility] where he resides on the day of this admission- he now is sedated (?from RX) and will need to be assessed for appropriate level of care at d/c  (SNF -vs- ALF).  Recommend PT eval to help with this determination. Patient's 2 sons are very involved and agree with this plan- CSW covering tomorrow to further assess for d/c plan and secure bed for him. FL2 on chart for MD signature-  Reece Levy, MSW, [facility]ors  [phone]   Assessment/plan status:  Psychosocial Support/Ongoing Assessment of Needs Other assessment/ plan:   Information/referral to community resources:   No resources needed at this time.    PATIENT'S/FAMILY'S RESPONSE TO PLAN OF CARE: Pt sons agreeable to determine plan of care based on PT/ OT recommendations.

## 2013-01-09 NOTE — ED Provider Notes (Signed)
Medical screening examination/treatment/procedure(s) were conducted as a shared visit with non-physician practitioner(s) and myself.  I personally evaluated the patient during the encounter.  EKG Interpretation     Ventricular Rate:  60 PR Interval:  83 QRS Duration: 89 QT Interval:  470 QTC Calculation: 470 R Axis:   89 Text Interpretation:  Sinus rhythm Short PR interval Borderline right axis deviation Nonspecific T abnormalities, lateral leads Baseline wander in lead(s) V6 Compared to previous tracing  chages in anterior t waves           Results for orders placed during the hospital encounter of 01/07/13  CULTURE, BLOOD (ROUTINE X 2)      Result Value Range   Specimen Description BLOOD LEFT ANTECUBITAL     Special Requests BOTTLES DRAWN AEROBIC AND ANAEROBIC 10CC     Culture  Setup Time       Value: 01/07/2013 23:39     Performed at [facility]   Culture       Value:        BLOOD CULTURE RECEIVED NO GROWTH TO DATE CULTURE WILL BE HELD FOR 5 DAYS BEFORE ISSUING A FINAL NEGATIVE REPORT     Performed at [facility]   Report Status PENDING    CULTURE, BLOOD (ROUTINE X 2)      Result Value Range   Specimen Description BLOOD HAND LEFT     Special Requests BOTTLES DRAWN AEROBIC ONLY 10CC     Culture  Setup Time       Value: 01/07/2013 23:38     Performed at [facility]   Culture       Value:        BLOOD CULTURE RECEIVED NO GROWTH TO DATE CULTURE WILL BE HELD FOR 5 DAYS BEFORE ISSUING A FINAL NEGATIVE REPORT     Performed at [facility]   Report Status PENDING    CBC WITH DIFFERENTIAL      Result Value Range   WBC 5.9  4.0 - 10.5 K/uL   RBC 3.76 (*) 4.22 - 5.81 MIL/uL   Hemoglobin 11.6 (*) 13.0 - 17.0 g/dL   HCT 16.1 (*) 09.6 - 04.5 %   MCV 92.8  78.0 - 100.0 fL   MCH 30.9  26.0 - 34.0 pg   MCHC 33.2  30.0 - 36.0 g/dL   RDW 40.9  81.1 - 91.4 %   Platelets 257  150 - 400 K/uL   Neutrophils Relative % 73  43 - 77 %   Neutro Abs 4.3   1.7 - 7.7 K/uL   Lymphocytes Relative 14  12 - 46 %   Lymphs Abs 0.8  0.7 - 4.0 K/uL   Monocytes Relative 9  3 - 12 %   Monocytes Absolute 0.5  0.1 - 1.0 K/uL   Eosinophils Relative 3  0 - 5 %   Eosinophils Absolute 0.2  0.0 - 0.7 K/uL   Basophils Relative 1  0 - 1 %   Basophils Absolute 0.0  0.0 - 0.1 K/uL  COMPREHENSIVE METABOLIC PANEL      Result Value Range   Sodium 139  135 - 145 mEq/L   Potassium 3.5  3.5 - 5.1 mEq/L   Chloride 103  96 - 112 mEq/L   CO2 27  19 - 32 mEq/L   Glucose, Bld 98  70 - 99 mg/dL   BUN 9  6 - 23 mg/dL   Creatinine, Ser 7.82  0.50 - 1.35 mg/dL  Calcium 8.8  8.4 - 10.5 mg/dL   Total Protein 6.3  6.0 - 8.3 g/dL   Albumin 3.1 (*) 3.5 - 5.2 g/dL   AST 29  0 - 37 U/L   ALT 25  0 - 53 U/L   Alkaline Phosphatase 52  39 - 117 U/L   Total Bilirubin 0.5  0.3 - 1.2 mg/dL   GFR calc non Af Amer 59 (*) >90 mL/min   GFR calc Af Amer 69 (*) >90 mL/min  URINALYSIS, ROUTINE W REFLEX MICROSCOPIC      Result Value Range   Color, Urine YELLOW  YELLOW   APPearance CLEAR  CLEAR   Specific Gravity, Urine 1.009  1.005 - 1.030   pH 6.5  5.0 - 8.0   Glucose, UA NEGATIVE  NEGATIVE mg/dL   Hgb urine dipstick SMALL (*) NEGATIVE   Bilirubin Urine NEGATIVE  NEGATIVE   Ketones, ur NEGATIVE  NEGATIVE mg/dL   Protein, ur NEGATIVE  NEGATIVE mg/dL   Urobilinogen, UA 0.2  0.0 - 1.0 mg/dL   Nitrite NEGATIVE  NEGATIVE   Leukocytes, UA NEGATIVE  NEGATIVE  ETHANOL      Result Value Range   Alcohol, Ethyl (B) <11  0 - 11 mg/dL  PROTIME-INR      Result Value Range   Prothrombin Time 14.9  11.6 - 15.2 seconds   INR 1.20  0.00 - 1.49  APTT      Result Value Range   aPTT 40 (*) 24 - 37 seconds  TROPONIN I      Result Value Range   Troponin I <0.30  <0.30 ng/mL  URINE RAPID DRUG SCREEN (HOSP PERFORMED)      Result Value Range   Opiates NONE DETECTED  NONE DETECTED   Cocaine NONE DETECTED  NONE DETECTED   Benzodiazepines NONE DETECTED  NONE DETECTED   Amphetamines NONE  DETECTED  NONE DETECTED   Tetrahydrocannabinol NONE DETECTED  NONE DETECTED   Barbiturates NONE DETECTED  NONE DETECTED  LACTIC ACID, PLASMA      Result Value Range   Lactic Acid, Venous 0.8  0.5 - 2.2 mmol/L  GLUCOSE, CAPILLARY      Result Value Range   Glucose-Capillary 102 (*) 70 - 99 mg/dL  PRO B NATRIURETIC PEPTIDE      Result Value Range   Pro B Natriuretic peptide (BNP) 3754.0 (*) 0 - 450 pg/mL  URINE MICROSCOPIC-ADD ON      Result Value Range   Squamous Epithelial / LPF RARE  RARE   WBC, UA 0-2  <3 WBC/hpf   RBC / HPF 0-2  <3 RBC/hpf  BASIC METABOLIC PANEL      Result Value Range   Sodium 140  135 - 145 mEq/L   Potassium 3.4 (*) 3.5 - 5.1 mEq/L   Chloride 105  96 - 112 mEq/L   CO2 25  19 - 32 mEq/L   Glucose, Bld 84  70 - 99 mg/dL   BUN 9  6 - 23 mg/dL   Creatinine, Ser 1.61  0.50 - 1.35 mg/dL   Calcium 8.5  8.4 - 09.6 mg/dL   GFR calc non Af Amer 65 (*) >90 mL/min   GFR calc Af Amer 75 (*) >90 mL/min  CBC      Result Value Range   WBC 10.2  4.0 - 10.5 K/uL   RBC 3.89 (*) 4.22 - 5.81 MIL/uL   Hemoglobin 12.1 (*) 13.0 - 17.0 g/dL   HCT 04.5 (*) 40.9 -  52.0 %   MCV 92.8  78.0 - 100.0 fL   MCH 31.1  26.0 - 34.0 pg   MCHC 33.5  30.0 - 36.0 g/dL   RDW 62.9  52.8 - 41.3 %   Platelets 264  150 - 400 K/uL  BASIC METABOLIC PANEL      Result Value Range   Sodium 141  135 - 145 mEq/L   Potassium 3.5  3.5 - 5.1 mEq/L   Chloride 102  96 - 112 mEq/L   CO2 27  19 - 32 mEq/L   Glucose, Bld 81  70 - 99 mg/dL   BUN 11  6 - 23 mg/dL   Creatinine, Ser 2.44  0.50 - 1.35 mg/dL   Calcium 8.8  8.4 - 01.0 mg/dL   GFR calc non Af Amer 61 (*) >90 mL/min   GFR calc Af Amer 71 (*) >90 mL/min  PRO B NATRIURETIC PEPTIDE      Result Value Range   Pro B Natriuretic peptide (BNP) 5496.0 (*) 0 - 450 pg/mL  POCT I-STAT TROPONIN I      Result Value Range   Troponin i, poc 0.04  0.00 - 0.08 ng/mL   Comment 3            Results for orders placed during the hospital encounter of 01/07/13   CULTURE, BLOOD (ROUTINE X 2)      Result Value Range   Specimen Description BLOOD LEFT ANTECUBITAL     Special Requests BOTTLES DRAWN AEROBIC AND ANAEROBIC 10CC     Culture  Setup Time       Value: 01/07/2013 23:39     Performed at [facility]   Culture       Value:        BLOOD CULTURE RECEIVED NO GROWTH TO DATE CULTURE WILL BE HELD FOR 5 DAYS BEFORE ISSUING A FINAL NEGATIVE REPORT     Performed at [facility]   Report Status PENDING    CULTURE, BLOOD (ROUTINE X 2)      Result Value Range   Specimen Description BLOOD HAND LEFT     Special Requests BOTTLES DRAWN AEROBIC ONLY 10CC     Culture  Setup Time       Value: 01/07/2013 23:38     Performed at [facility]   Culture       Value:        BLOOD CULTURE RECEIVED NO GROWTH TO DATE CULTURE WILL BE HELD FOR 5 DAYS BEFORE ISSUING A FINAL NEGATIVE REPORT     Performed at [facility]   Report Status PENDING    CBC WITH DIFFERENTIAL      Result Value Range   WBC 5.9  4.0 - 10.5 K/uL   RBC 3.76 (*) 4.22 - 5.81 MIL/uL   Hemoglobin 11.6 (*) 13.0 - 17.0 g/dL   HCT 27.2 (*) 53.6 - 64.4 %   MCV 92.8  78.0 - 100.0 fL   MCH 30.9  26.0 - 34.0 pg   MCHC 33.2  30.0 - 36.0 g/dL   RDW 03.4  74.2 - 59.5 %   Platelets 257  150 - 400 K/uL   Neutrophils Relative % 73  43 - 77 %   Neutro Abs 4.3  1.7 - 7.7 K/uL   Lymphocytes Relative 14  12 - 46 %   Lymphs Abs 0.8  0.7 - 4.0 K/uL   Monocytes Relative 9  3 - 12 %   Monocytes  Absolute 0.5  0.1 - 1.0 K/uL   Eosinophils Relative 3  0 - 5 %   Eosinophils Absolute 0.2  0.0 - 0.7 K/uL   Basophils Relative 1  0 - 1 %   Basophils Absolute 0.0  0.0 - 0.1 K/uL  COMPREHENSIVE METABOLIC PANEL      Result Value Range   Sodium 139  135 - 145 mEq/L   Potassium 3.5  3.5 - 5.1 mEq/L   Chloride 103  96 - 112 mEq/L   CO2 27  19 - 32 mEq/L   Glucose, Bld 98  70 - 99 mg/dL   BUN 9  6 - 23 mg/dL   Creatinine, Ser 1.61  0.50 - 1.35 mg/dL   Calcium 8.8  8.4 - 09.6 mg/dL    Total Protein 6.3  6.0 - 8.3 g/dL   Albumin 3.1 (*) 3.5 - 5.2 g/dL   AST 29  0 - 37 U/L   ALT 25  0 - 53 U/L   Alkaline Phosphatase 52  39 - 117 U/L   Total Bilirubin 0.5  0.3 - 1.2 mg/dL   GFR calc non Af Amer 59 (*) >90 mL/min   GFR calc Af Amer 69 (*) >90 mL/min  URINALYSIS, ROUTINE W REFLEX MICROSCOPIC      Result Value Range   Color, Urine YELLOW  YELLOW   APPearance CLEAR  CLEAR   Specific Gravity, Urine 1.009  1.005 - 1.030   pH 6.5  5.0 - 8.0   Glucose, UA NEGATIVE  NEGATIVE mg/dL   Hgb urine dipstick SMALL (*) NEGATIVE   Bilirubin Urine NEGATIVE  NEGATIVE   Ketones, ur NEGATIVE  NEGATIVE mg/dL   Protein, ur NEGATIVE  NEGATIVE mg/dL   Urobilinogen, UA 0.2  0.0 - 1.0 mg/dL   Nitrite NEGATIVE  NEGATIVE   Leukocytes, UA NEGATIVE  NEGATIVE  ETHANOL      Result Value Range   Alcohol, Ethyl (B) <11  0 - 11 mg/dL  PROTIME-INR      Result Value Range   Prothrombin Time 14.9  11.6 - 15.2 seconds   INR 1.20  0.00 - 1.49  APTT      Result Value Range   aPTT 40 (*) 24 - 37 seconds  TROPONIN I      Result Value Range   Troponin I <0.30  <0.30 ng/mL  URINE RAPID DRUG SCREEN (HOSP PERFORMED)      Result Value Range   Opiates NONE DETECTED  NONE DETECTED   Cocaine NONE DETECTED  NONE DETECTED   Benzodiazepines NONE DETECTED  NONE DETECTED   Amphetamines NONE DETECTED  NONE DETECTED   Tetrahydrocannabinol NONE DETECTED  NONE DETECTED   Barbiturates NONE DETECTED  NONE DETECTED  LACTIC ACID, PLASMA      Result Value Range   Lactic Acid, Venous 0.8  0.5 - 2.2 mmol/L  GLUCOSE, CAPILLARY      Result Value Range   Glucose-Capillary 102 (*) 70 - 99 mg/dL  PRO B NATRIURETIC PEPTIDE      Result Value Range   Pro B Natriuretic peptide (BNP) 3754.0 (*) 0 - 450 pg/mL  URINE MICROSCOPIC-ADD ON      Result Value Range   Squamous Epithelial / LPF RARE  RARE   WBC, UA 0-2  <3 WBC/hpf   RBC / HPF 0-2  <3 RBC/hpf  BASIC METABOLIC PANEL      Result Value Range   Sodium 140  135 - 145  mEq/L  Potassium 3.4 (*) 3.5 - 5.1 mEq/L   Chloride 105  96 - 112 mEq/L   CO2 25  19 - 32 mEq/L   Glucose, Bld 84  70 - 99 mg/dL   BUN 9  6 - 23 mg/dL   Creatinine, Ser 0.45  0.50 - 1.35 mg/dL   Calcium 8.5  8.4 - 40.9 mg/dL   GFR calc non Af Amer 65 (*) >90 mL/min   GFR calc Af Amer 75 (*) >90 mL/min  CBC      Result Value Range   WBC 10.2  4.0 - 10.5 K/uL   RBC 3.89 (*) 4.22 - 5.81 MIL/uL   Hemoglobin 12.1 (*) 13.0 - 17.0 g/dL   HCT 81.1 (*) 91.4 - 78.2 %   MCV 92.8  78.0 - 100.0 fL   MCH 31.1  26.0 - 34.0 pg   MCHC 33.5  30.0 - 36.0 g/dL   RDW 95.6  21.3 - 08.6 %   Platelets 264  150 - 400 K/uL  BASIC METABOLIC PANEL      Result Value Range   Sodium 141  135 - 145 mEq/L   Potassium 3.5  3.5 - 5.1 mEq/L   Chloride 102  96 - 112 mEq/L   CO2 27  19 - 32 mEq/L   Glucose, Bld 81  70 - 99 mg/dL   BUN 11  6 - 23 mg/dL   Creatinine, Ser 5.78  0.50 - 1.35 mg/dL   Calcium 8.8  8.4 - 46.9 mg/dL   GFR calc non Af Amer 61 (*) >90 mL/min   GFR calc Af Amer 71 (*) >90 mL/min  PRO B NATRIURETIC PEPTIDE      Result Value Range   Pro B Natriuretic peptide (BNP) 5496.0 (*) 0 - 450 pg/mL  POCT I-STAT TROPONIN I      Result Value Range   Troponin i, poc 0.04  0.00 - 0.08 ng/mL   Comment 3            Ct Head Wo Contrast  01/07/2013   CLINICAL DATA:  Altered mental status.  EXAM: CT HEAD WITHOUT CONTRAST  TECHNIQUE: Contiguous axial images were obtained from the base of the skull through the vertex without intravenous contrast.  COMPARISON:  12/24/2012.  FINDINGS: Prior burr-hole PROCEDURE for drainage of left-sided subdural hematoma.  No intracranial hemorrhage.  Small vessel disease type changes without CT evidence of large acute infarct.  Global atrophy.  No intracranial mass lesion noted on this unenhanced exam.  Vascular calcifications.  IMPRESSION: No acute abnormality. Please see above.   Electronically Signed   By: Bridgett Larsson M.D.   On: 01/07/2013 13:42   Ct Head Wo  Contrast  12/24/2012   CLINICAL DATA:  Confusion.  EXAM: CT HEAD WITHOUT CONTRAST  TECHNIQUE: Contiguous axial images were obtained from the base of the skull through the vertex without intravenous contrast.  COMPARISON:  12/22/2012 and 06/16/2012.  FINDINGS: Prior for burrhole procedure for drainage of left-sided subdural hematoma as per history provided.  No intracranial hemorrhage.  Small vessel disease type changes without CT evidence of large acute infarct.  Global atrophy without hydrocephalus.  No intracranial mass lesion noted on this unenhanced exam.  Vascular calcifications.  IMPRESSION: No acute abnormality. Please see above.   Electronically Signed   By: Bridgett Larsson M.D.   On: 12/24/2012 12:54   Ct Head Wo Contrast  12/22/2012   CLINICAL DATA:  Altered mental status. Fall. Head trauma. Prior subdural  hematoma.  EXAM: CT HEAD WITHOUT CONTRAST  TECHNIQUE: Contiguous axial images were obtained from the base of the skull through the vertex without intravenous contrast.  COMPARISON:  06/16/2012  FINDINGS: There is no evidence of intracranial hemorrhage, brain edema, or other signs of acute infarction. There is no evidence of intracranial mass lesion or mass effect. No abnormal extraaxial fluid collections are identified.  Mild to moderate diffuse cerebral atrophy is stable as well as mild chronic small vessel disease. Ventricles are stable in size. No evidence of acute skull fracture. 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  IMPRESSION: No acute findings.  Stable cerebral atrophy and chronic small vessel disease.   Electronically Signed   By: Myles Rosenthal M.D.   On: 12/22/2012 21:34   Ir US Guide Vasc Access Right  12/29/2012   CLINICAL DATA:  Encephalitis and need for venous access. A previously placed PICC line has been completely removed by the patient.  EXAM: RIGHT UPPER EXTREMITY PICC LINE PLACEMENT WITH ULTRASOUND AND FLUOROSCOPIC GUIDANCE  FLUOROSCOPY TIME:  12 seconds.  PROCEDURE:  The patient's son was advised of the possible risks and complications and agreed to undergo the procedure. The patient was then brought to the angiographic suite for the procedure.  The right arm was prepped with chlorhexidine, draped in the usual sterile fashion using maximum barrier technique (cap and mask, sterile gown, sterile gloves, large sterile sheet, hand hygiene and cutaneous antisepsis) and infiltrated locally with 1% Lidocaine.  Ultrasound demonstrated patency of the right basilic vein, and this was documented with an image. Under real-time ultrasound guidance, this vein was accessed with a 21 gauge micropuncture needle and image documentation was performed. A 0.018 wire was introduced in to the vein. Over this, a 5 [place] dual lumen power injectable PICC cut to 41 cm length was advanced to the lower SVC/right atrial junction. Fluoroscopy during the procedure and fluoro spot radiograph confirms appropriate catheter position. The catheter was flushed and covered with a sterile dressing.  Complications: None  IMPRESSION: Successful right arm power injectable PICC line placement with ultrasound and fluoroscopic guidance. The catheter is ready for use.   Electronically Signed   By: Irish Lack M.D.   On: 12/29/2012 14:05   Dg Chest Port 1 View  01/09/2013   CLINICAL DATA:  Pulmonary edema  EXAM: PORTABLE CHEST - 1 VIEW  COMPARISON:  January 07, 2013  FINDINGS: There is an overall increase in interstitial and patchy alveolar edema. Heart is mildly enlarged. There are pleural effusions bilaterally. Pacemaker leads are attached to the right atrium and right ventricle. No adenopathy. No pneumothorax.  IMPRESSION: Congestive heart failure with overall slight increase in edema compared to 2 days prior.   Electronically Signed   By: Bretta Bang M.D.   On: 01/09/2013 08:15   Dg Chest Portable 1 View  01/07/2013   CLINICAL DATA:  Altered mental status.  EXAM: PORTABLE CHEST - 1 VIEW  COMPARISON:   12/29/2012 and 12/28/2012.  FINDINGS: Right PICC line has been removed.  Sequential pacemaker remains in place with leads unchanged in position.  Pulmonary edema. Cardiomegaly. No gross pneumothorax.  Limited for evaluating for underlying infiltrate or mass.  Calcified tortuous aorta.  IMPRESSION: Right PICC line has been removed.  Sequential pacemaker remains in place with leads unchanged in position.  Pulmonary edema. Cardiomegaly.   Electronically Signed   By: Bridgett Larsson M.D.   On: 01/07/2013 11:49   Dg Chest Port 1 View  12/28/2012  CLINICAL DATA:  PICC line placement.  EXAM: PORTABLE CHEST - 1 VIEW  COMPARISON:  12/26/2012.  FINDINGS: The right PICC line tip is approximately 3 cm below the region of the cavoatrial junction and in the right atrium. The pacer wires are stable. Stable cardiac enlargement, vascular congestion, bibasilar infiltrates and pleural effusions.  IMPRESSION: The right PICC line tip is in the right atrium approximately 3 cm below the region of the cavoatrial junction.   Electronically Signed   By: Loralie Champagne M.D.   On: 12/28/2012 12:55   Dg Chest Port 1 View  12/26/2012   CLINICAL DATA:  Cough and congestion.  EXAM: PORTABLE CHEST - 1 VIEW  COMPARISON:  12/22/2012.  FINDINGS: The heart is upper limits of normal and stable. The mediastinal and hilar contours are unchanged. There is tortuosity and calcification of the thoracic aorta. The pacer wires are stable. There are bibasilar infiltrates and small effusions.  IMPRESSION: Bibasilar infiltrates and small effusions.   Electronically Signed   By: Loralie Champagne M.D.   On: 12/26/2012 18:25   Dg Chest Port 1 View  12/22/2012   CLINICAL DATA:  Altered mental status. Weakness.  EXAM: PORTABLE CHEST - 1 VIEW  COMPARISON:  06/16/2012  FINDINGS: Low lung volumes noted. Heart size within normal limits. Both lungs are clear. Dual lead transvenous pacemaker remains in appropriate position.  IMPRESSION: No acute findings.    Electronically Signed   By: Myles Rosenthal M.D.   On: 12/22/2012 20:17   Ir Fluoro Guide Cv Midline Picc Right  12/29/2012   CLINICAL DATA:  Encephalitis and need for venous access. A previously placed PICC line has been completely removed by the patient.  EXAM: RIGHT UPPER EXTREMITY PICC LINE PLACEMENT WITH ULTRASOUND AND FLUOROSCOPIC GUIDANCE  FLUOROSCOPY TIME:  12 seconds.  PROCEDURE: The patient's son was advised of the possible risks and complications and agreed to undergo the procedure. The patient was then brought to the angiographic suite for the procedure.  The right arm was prepped with chlorhexidine, draped in the usual sterile fashion using maximum barrier technique (cap and mask, sterile gown, sterile gloves, large sterile sheet, hand hygiene and cutaneous antisepsis) and infiltrated locally with 1% Lidocaine.  Ultrasound demonstrated patency of the right basilic vein, and this was documented with an image. Under real-time ultrasound guidance, this vein was accessed with a 21 gauge micropuncture needle and image documentation was performed. A 0.018 wire was introduced in to the vein. Over this, a 5 [place] dual lumen power injectable PICC cut to 41 cm length was advanced to the lower SVC/right atrial junction. Fluoroscopy during the procedure and fluoro spot radiograph confirms appropriate catheter position. The catheter was flushed and covered with a sterile dressing.  Complications: None  IMPRESSION: Successful right arm power injectable PICC line placement with ultrasound and fluoroscopic guidance. The catheter is ready for use.   Electronically Signed   By: Irish Lack M.D.   On: 12/29/2012 14:05   Dg Fluoro Guide Lumbar Puncture  12/25/2012   CLINICAL DATA:  Fever. Altered mental status.  EXAM: DIAGNOSTIC LUMBAR PUNCTURE UNDER FLUOROSCOPIC GUIDANCE  FLUOROSCOPY TIME:  45 seconds.  PROCEDURE: Informed consent was obtained from the patient prior to the procedure, including potential  complications of headache, allergy, and pain. With the patient prone, the lower back was prepped with Betadine. 1% Lidocaine was used for local anesthesia. Lumbar puncture was performed at the L3-4 level using a gauge needle with return of clear CSF with an  opening pressure of 21 cm water. 11.9ml of CSF were obtained for laboratory studies. The patient tolerated the procedure well and there were no apparent complications.  IMPRESSION: Successful lumbar puncture as above.   Electronically Signed   By: Drusilla Kanner M.D.   On: 12/25/2012 11:31    Patient workup without specific findings but will still need admission for persistent mental status. Patient seen by me and not toxic. Recommend medical admission. As history states from NH and question of mennigitis but already on antibiotics, PICC line removed yesterday. Patient not able to provide hx or ROS. Had LP 10/16 not specific.   Shelda Jakes, MD 01/09/13 [id number]
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
## 2013-01-09 NOTE — Progress Notes (Signed)
SLP Cancellation Note  Patient Details Name: Charles Lambert MRN: [id number] DOB: Jul 14, 1927   Cancelled treatment:       Reason Eval/Treat Not Completed: Patient's level of consciousness.  Patient continues to demonstrate decreased level of arousal, making p.o. trials unsafe at this time.  SLP signing off please re-order when appropriate.    Thanks Charlane Ferretti., CCC-SLP [phone]  [name] 01/09/2013, 11:32 AM

## 2013-01-09 NOTE — Progress Notes (Signed)
Subjective: Some improvement in MS overnight  Exam: Filed Vitals:   01/09/13 1013  BP: 171/78  Pulse: 60  Temp: 98 F (36.7 C)  Resp: 19   Gen: In bed, NAD MS: Asleep, but does awaken to mild stimulus. Able to follow commands, but appears mildly dysarthric. He does appear to have a grasp reflex in his left hand.  ZO:XWRUE, EOMI Motor: follows commands in both arms.  Sensory:responds to noxious stimuli.   Impression: 78 yo M with dementia presenting with episode of unresponsiveness which I presume to be seizure. There was a single sharp wave on his EEG suggesting an epileptic predisposition which I feel is related to his dementia. He is someone that is very affected by medications and therefore I would favor a lower dose of keppra, if sedation continues to be a problem, could conisder another agent such as lamictal.   Recommendations: 1)Continue keppra 250mg  BID.   Ritta Slot, MD [facility] [phone]  If 7pm- 7am, please page neurology on call at [phone].

## 2013-01-09 NOTE — Progress Notes (Signed)
Clinical Child psychotherapist (CSW) received a call from [place] [facility] inquiring whether pt would be dc'ing today, if so, all dc paperwork would need to be received by 13:00. Annice Pih later followed up with CSW and informed CSW that pt will need to be seen by PT/OT prior to dc and facility will not accept any weekend admissions. CSW also made aware that pt has a sitter in the room. CSW covering on Monday to assist with dc if pt is medically stable and sitter-free at that time.  Theresia Bough, MSW, LCSW [phone]

## 2013-01-10 LAB — BASIC METABOLIC PANEL
Calcium: 8.8 mg/dL (ref 8.4–10.5)
Chloride: 100 mEq/L (ref 96–112)
GFR calc Af Amer: 70 mL/min — ABNORMAL LOW (ref >90)
GFR calc non Af Amer: 61 mL/min — ABNORMAL LOW (ref >90)
Glucose, Bld: 76 mg/dL (ref 70–99)
Potassium: 3.1 mEq/L — ABNORMAL LOW (ref 3.5–5.1)
Sodium: 140 mEq/L (ref 135–145)

## 2013-01-10 MED ORDER — POTASSIUM CHLORIDE 10 MEQ/100ML IV SOLN
10.0000 meq | INTRAVENOUS | Status: AC
  Administered 2013-01-10 (×4): 10 meq via INTRAVENOUS
  Filled 2013-01-10 (×4): qty 100

## 2013-01-10 MED ORDER — PANTOPRAZOLE SODIUM 40 MG PO TBEC
40.0000 mg | DELAYED_RELEASE_TABLET | Freq: Every day | ORAL | Status: DC
  Filled 2013-01-10: qty 1
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
## 2013-01-10 NOTE — Evaluation (Signed)
Occupational Therapy Evaluation Patient Details Name: Charles Lambert MRN: [id number] DOB: 29-Jun-1927 Today's Date: 01/10/2013 Time: 4098-1191 OT Time Calculation (min): 15 min  OT Assessment / Plan / Recommendation History of present illness pt presents with AMS and r/o meningitis.     Clinical Impression   PT admitted with AMS. Pt currently with functional limitiations due to the deficits listed below (see OT problem list). Pt with cognitive deficits that limited eval to bed level. Pt will benefit from skilled OT to increase their independence and safety with adls and balance to allow discharge SNF. OT and sitter attempting EOB sitting during session and unsuccessful. Pt combative and unwilling to participate. Sitter reports PM behavior is much different than AM. Question sun downers due to behavioral change in later afternoon hours.     OT Assessment  Patient needs continued OT Services    Follow Up Recommendations  SNF;Supervision/Assistance - 24 hour    Barriers to Discharge      Equipment Recommendations  3 in 1 bedside comode;Wheelchair (measurements OT);Hospital bed;Wheelchair cushion (measurements OT)    Recommendations for Other Services    Frequency  Min 1X/week    Precautions / Restrictions Precautions Precautions: Fall   Pertinent Vitals/Pain None reported no signs or symptoms    ADL  Eating/Feeding: Minimal assistance Where Assessed - Eating/Feeding: Bed level (drinking supine with HOB raised) ADL Comments: evaluation limited due to patients ability to corporate with therapy. Sitter states patient was able to sit upright in chair this AM for 4 hours but now is restless and combative. Pt with green mittens. pt pulling and attempting to throw mittens. Pt needed max (A) to d/c mittens to attempt eval. Pt pullign at therapist. Pt bending therapist hands and max v/c to release therapist. Pt does not follow simple command. pt atempting to place therapist or sheets  between hands and make a twisting motion. Pt with restless behavior that does not appear to be aggressive in nature but more of a repetitive motion. IF staff are placed in the space patient can reach he attempts to add them into this pattern of movement. Pt was unable to be sat EOB this session due to cognitive deficits. Pt states "no" once durnig session in respons to tactile input. Water was used as a Regulatory affairs officer and pt declined. Pt attending to the right side and demonstrates left inattention.     OT Diagnosis: Generalized weakness;Cognitive deficits  OT Problem List: Decreased strength;Decreased activity tolerance;Impaired balance (sitting and/or standing);Decreased coordination;Decreased cognition;Decreased safety awareness;Decreased knowledge of use of DME or AE OT Treatment Interventions: Self-care/ADL training;Therapeutic exercise;DME and/or AE instruction;Therapeutic activities;Cognitive remediation/compensation;Patient/family education;Balance training   OT Goals(Current goals can be found in the care plan section) Acute Rehab OT Goals Patient Stated Goal: None stated.   OT Goal Formulation: Patient unable to participate in goal setting Time For Goal Achievement: 01/24/13 Potential to Achieve Goals: Fair  Visit Information  Last OT Received On: 01/10/13 Assistance Needed: +2 History of Present Illness: pt presents with AMS and r/o meningitis.         Prior Functioning     Home Living Family/patient expects to be discharged to:: Skilled nursing facility Prior Function Level of Independence: Needs assistance ADL's / Homemaking Assistance Needed: needs (A) for all adls at this time acutely Communication Communication: Other (comment) (pt whispers at time during evaluation) Dominant Hand:  (unknown)         Vision/Perception     Cognition  Cognition Arousal/Alertness: Awake/alert Behavior During Therapy: Flat  affect Overall Cognitive Status: History of cognitive  impairments - at baseline Area of Impairment: Orientation;Attention;Following commands;Safety/judgement;Awareness Orientation Level: Disoriented to;Place;Time;Situation Current Attention Level: Focused Memory: Decreased recall of precautions;Decreased short-term memory Following Commands: Follows one step commands inconsistently Safety/Judgement: Decreased awareness of safety;Decreased awareness of deficits Awareness: Intellectual;Emergent;Anticipatory General Comments: Pt combative pulling at therapist and holding onto therapist tightless. Pt asked to release therapist and pt not following command. pt asked to follow simple command "show me two fingers" pt did so with delayed response. Pt asked "touch your nose" pt following command with delay. pt not agreeable to bed mobility. Pt unable to state location or date.     Extremity/Trunk Assessment Upper Extremity Assessment Upper Extremity Assessment: Generalized weakness Lower Extremity Assessment Lower Extremity Assessment: Defer to PT evaluation     Mobility Bed Mobility Bed Mobility: Rolling Right;Rolling Left Rolling Right: 2: Max assist;With rail Rolling Left: 2: Max assist;With rail Details for Bed Mobility Assistance: pad used to help facilitate Transfers Transfers: Not assessed     Exercise     Balance     End of Session OT - End of Session Activity Tolerance: Patient tolerated treatment well Patient left: in bed;with call bell/phone within reach;with nursing/sitter in room Nurse Communication: Mobility status;Precautions  GO     Harolyn Rutherford 01/10/2013, 3:38 PM Pager: [phone]

## 2013-01-10 NOTE — Progress Notes (Signed)
Clinical Child psychotherapist (CSW) received call from RN reporting that patient's son Dinesh would like to speak to a Child psychotherapist. CSW met with patient and son in the room. CSW reported to son that per CSW Norma's note Annice Pih at [facility] reported to CSW [place] that patient would need a PT/OT evaluation before returning to the facility. Annice Pih at [facility] also reported to CSW Nelva Bush that they could not accept weekend admissions unless the paperwork was done on Friday by 13:00. Patient's son Canuto was made aware of above and verbalized his understanding. Son reported that the sitter could D/C tomorrow 11/2 and the son would sit with the patient. Son requested CSW fax out patient's information to [facility] for short term rehab. CSW explained to son that the admissions person is likely not there on the weekends and they will not respond to the referral until Monday 11/3. Son verbalized his understanding. Son is considering taking patient back to [facility] or [facility] depending on the PT/OT evaluation and bed availability. Weekday CSW to follow up.   Jetta Lout, LCSWA Weekend CSW [phone]

## 2013-01-10 NOTE — Progress Notes (Signed)
Subjective: Was much more awake this morning, he would've her to stay with speech therapy to have diet reinstated. Subsequently has become more drowsy  Exam: Filed Vitals:   01/10/13 1021  BP: 165/76  Pulse: 60  Temp: 98.4 F (36.9 C)  Resp: 20   Gen: In bed, NAD MS: Asleep, but does awaken to mild stimulus. Able to follow commands. Able to tell me his name, and answer simple questions  ZO:XWRUE, EOMI Motor: follows commands in both arms.  Sensory:responds to noxious stimuli.   Impression: 79 yo M with dementia presenting with episode of unresponsiveness which I presume to be seizure. There was a single sharp wave on his EEG suggesting an epileptic predisposition which I feel is related to his dementia. He is someone that is very affected by medications and therefore I would favor a lower dose of keppra, if sedation continues to be a problem after several weeks, could conisder another agent such as lamictal.   Recommendations: 1)Continue keppra 250mg  BID.  2) patient will need followup with either [facility] [address] [place], [place] [address]--[phone] or  [facility] neurology [address] Sherian Maroon [place], [place] [address] [phone] in 2-4 weeks    Ritta Slot, MD [facility] [phone]  If 7pm- 7am, please page neurology on call at [phone].

## 2013-01-10 NOTE — Progress Notes (Signed)
PT Cancellation Note  Patient Details Name: [name] MRN: [id number] DOB: 1927-08-27   Cancelled Treatment:    Reason Eval/Treat Not Completed: Medical issues which prohibited therapy.  Patient very combative with OT this evening.  Will hold PT eval this pm.  Will return in am for PT evaluation - per chart patient "better" in mornings.   [name] 01/10/2013, 6:48 PM Durenda Hurt. Renaldo Fiddler, [facility] Acute Rehab Services Pager [phone]
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
## 2013-01-11 LAB — BASIC METABOLIC PANEL
CO2: 29 mEq/L (ref 19–32)
Chloride: 100 mEq/L (ref 96–112)
GFR calc Af Amer: 72 mL/min — ABNORMAL LOW (ref >90)
GFR calc non Af Amer: 62 mL/min — ABNORMAL LOW (ref >90)
Potassium: 3.2 mEq/L — ABNORMAL LOW (ref 3.5–5.1)

## 2013-01-11 MED ORDER — LEVETIRACETAM 250 MG PO TABS
250.0000 mg | ORAL_TABLET | Freq: Two times a day (BID) | ORAL | Status: DC
Start: 2013-01-11 — End: 2013-01-11
  Filled 2013-01-11: qty 1

## 2013-01-11 MED ORDER — POTASSIUM CHLORIDE CRYS ER 20 MEQ PO TBCR
40.0000 meq | EXTENDED_RELEASE_TABLET | Freq: Once | ORAL | Status: AC
  Administered 2013-01-11: 40 meq via ORAL
  Filled 2013-01-11: qty 2

## 2013-01-11 MED ORDER — SODIUM CHLORIDE 0.9 % IV SOLN
500.0000 mg | Freq: Two times a day (BID) | INTRAVENOUS | Status: DC
  Administered 2013-01-11 – 2013-01-14 (×6): 500 mg via INTRAVENOUS
  Filled 2013-01-11 (×7): qty 5

## 2013-01-11 NOTE — Evaluation (Signed)
Physical Therapy Evaluation Patient Details Name: Charles Lambert MRN: [id number] DOB: June 07, 1927 Today's Date: 01/11/2013 Time: 3086-5784 PT Time Calculation (min): 17 min  PT Assessment / Plan / Recommendation History of Present Illness  Patient is an 79 yo male admitted with AMS - acute encephalopathy and ? seizures.  Patient with h/o dementia.  Clinical Impression  Patient presents with problems listed below. Patient very lethargic today - unable to get patient to alert level.  Worked on sitting EOB.  Patient will benefit from acute PT to maximize functional mobility prior to discharge.  Recommend patient return to SNF for continued therapy prior to returning to ALF.    PT Assessment  Patient needs continued PT services    Follow Up Recommendations  SNF    Does the patient have the potential to tolerate intense rehabilitation      Barriers to Discharge        Equipment Recommendations  None recommended by PT    Recommendations for Other Services     Frequency Min 3X/week    Precautions / Restrictions Precautions Precautions: Fall Restrictions Weight Bearing Restrictions: No   Pertinent Vitals/Pain       Mobility  Bed Mobility Bed Mobility: Rolling Left;Left Sidelying to Sit;Sitting - Scoot to [place] of Bed;Sit to Sidelying Left;Scooting to [facility] Rolling Left: 1: +1 Total assist Left Sidelying to Sit: 1: +2 Total assist;HOB elevated Left Sidelying to Sit: Patient Percentage: 0% Sitting - Scoot to Edge of Bed: 2: Max assist Sit to Sidelying Left: 1: +2 Total assist;HOB elevated Sit to Sidelying Left: Patient Percentage: 10% Scooting to HOB: 1: +2 Total assist Scooting to [facility]: Patient Percentage: 0% Details for Bed Mobility Assistance: Verbal and tactile cues to use UE's to assist with mobility.  Patient unable to use UE's.  Required +2 assist for most of mobility.  In sitting, patient able to maintain head control.  Automatically placed LUE on bed to help with  balance.  Was able to maintain balance in sitting for 5-10 seconds several times.  Patient sat EOB x 9 minutes.  Increased stimulation to get patient to open eyes - did not.  Returned to sidelying > supine with +2 assist. Transfers Transfers: Not assessed    Exercises     PT Diagnosis: Difficulty walking;Generalized weakness;Altered mental status  PT Problem List: Decreased strength;Decreased activity tolerance;Decreased balance;Decreased mobility;Decreased cognition;Decreased knowledge of use of DME PT Treatment Interventions: DME instruction;Gait training;Functional mobility training;Balance training;Cognitive remediation;Patient/family education     PT Goals(Current goals can be found in the care plan section) Acute Rehab PT Goals Patient Stated Goal: None stated.   PT Goal Formulation: With family Time For Goal Achievement: 01/25/13 Potential to Achieve Goals: Fair  Visit Information  Last PT Received On: 01/11/13 Assistance Needed: +2 History of Present Illness: Patient is an 79 yo male admitted with AMS - acute encephalopathy and ? seizures.  Patient with h/o dementia.       Prior Functioning  Home Living Family/patient expects to be discharged to:: Skilled nursing facility Additional Comments: Patient is from memory unit at ALF.  Recently in SNF for antibiotics. Prior Function Level of Independence: Needs assistance Gait / Transfers Assistance Needed: Son reports patient was ambulatory without assistive device. Communication Communication: Other (comment) (No verbalizations during PT evaluation)    Cognition  Cognition Arousal/Alertness: Lethargic Behavior During Therapy: Flat affect Overall Cognitive Status: Impaired/Different from baseline General Comments: Difficult to assess due to lethargy.  Patient not responding to questions verbally.  Not  following commands.  Noted automatic movement of LUE.    Extremity/Trunk Assessment Upper Extremity Assessment Upper  Extremity Assessment: Defer to OT evaluation Lower Extremity Assessment Lower Extremity Assessment: Difficult to assess due to impaired cognition   Balance Balance Balance Assessed: Yes Static Sitting Balance Static Sitting - Balance Support: Left upper extremity supported;Feet supported Static Sitting - Level of Assistance: 3: Mod assist Static Sitting - Comment/# of Minutes: 9 minutes requiring mod assist for majority of time.  Was able to maintain balance for 5-10 seconds on 3 occasions.  End of Session PT - End of Session Activity Tolerance: Patient limited by lethargy Patient left: in bed;with call bell/phone within reach;with family/visitor present Nurse Communication: Mobility status  GP     [name] 01/11/2013, 2:44 PM Durenda Hurt. Renaldo Fiddler, [facility] Acute Rehab Services Pager [phone]

## 2013-01-11 NOTE — Progress Notes (Signed)
Pt. very sleepy today; awoke earlier today for ~10 minutes and took meds and was given a bath. Incomprehensible speech at that time.For the majority of today pt has only gripped this RNs hand in response to movement; does move his head to the side when this RN checks pupils. Currently not speaking to anyone. Dr. Waymon Amato and Dr. Amada Jupiter made aware of continued decreased LOC; no further orders received at this time. Will monitor.

## 2013-01-11 NOTE — Progress Notes (Signed)
Son notified this RN of concerns that pt. Has had a "seizure." Pt. Assessed and remains lethargic, able to grip and pull but not on command. Pupils 2 and brisk. No change noted from earlier neuro assessment. Dr. Amada Jupiter notified of son's concerns and spoke with the son. Will monitor.
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
## 2013-01-11 NOTE — Progress Notes (Signed)
Subjective: Patient appears to keep eyes closed, even while awake. No signs of ongoing seizures.   Exam: Filed Vitals:   01/11/13 1031  BP: 145/56  Pulse: 60  Temp: 98.9 F (37.2 C)  Resp: 20   Gen: In bed, NAD MS: Awakens easily, follows commands, tells me his name.  ZO:XWRUE, EOMI,  Motor: follows commands with good strength in all four extremities.  Sensory:intact to LT  Impression: 79 yo M with dementia presenting with episode of unresponsiveness which I presume to be seizure. There was a single sharp wave on his EEG suggesting an epileptic predisposition which I feel is related to his dementia. He is someone that is very affected by medications and therefore I would favor a lower dose of keppra, if sedation continues to be a problem after several weeks, could conisder another agent such as lamictal.   Recommendations: 1) continue keppra 250mg  BID.  2) patient will need followup with either [facility] [address] [place], [place] [address]--[phone] or  [facility] neurology [address] [place], [place] [address] [phone] in 2-4 weeks   Ritta Slot, MD [facility] [phone]  If 7pm- 7am, please page neurology on call at [phone].

## 2013-01-11 NOTE — Progress Notes (Signed)
Patient again has been somnolent all day. He took his am medications and per nursing has been asleep since. Had changed his Keppra from IV to PO this am, but since unable to take PO now- will change back to IV.Discussed with Dr. Amada Jupiter who plans 24 hour EEG monitoring tomorrow.  Marcellus Scott, MD, FACP, FHM. [facility] Pager [phone]  If 7PM-7AM, please contact night-coverage [email or web address] Password [facility] 01/11/2013, 5:40 PM

## 2013-01-12 ENCOUNTER — Inpatient Hospital Stay (HOSPITAL_COMMUNITY): Payer: Medicare Other

## 2013-01-12 LAB — BASIC METABOLIC PANEL
BUN: 11 mg/dL (ref 6–23)
CO2: 29 mEq/L (ref 19–32)
Calcium: 8.9 mg/dL (ref 8.4–10.5)
Chloride: 105 mEq/L (ref 96–112)
GFR calc Af Amer: 66 mL/min — ABNORMAL LOW (ref >90)
Sodium: 142 mEq/L (ref 135–145)

## 2013-01-12 LAB — BLOOD GAS, ARTERIAL
Acid-Base Excess: 3.6 mmol/L — ABNORMAL HIGH (ref 0.0–2.0)
Drawn by: 31996
O2 Saturation: 90.9 %
pCO2 arterial: 37.7 mmHg (ref 35.0–45.0)
pO2, Arterial: 60.2 mmHg — ABNORMAL LOW (ref 80.0–100.0)

## 2013-01-12 LAB — MAGNESIUM: Magnesium: 1.8 mg/dL (ref 1.5–2.5)

## 2013-01-12 NOTE — Progress Notes (Signed)
Clinical Social Work Department CLINICAL SOCIAL WORK PLACEMENT NOTE 01/12/2013  Patient:  Charles Lambert, Charles Lambert  Account Number:  [id number] Admit date:  01/07/2013  Clinical Social Worker:  Carren Rang  Date/time:  01/12/2013 03:11 PM  Clinical Social Work is seeking post-discharge placement for this patient at the following level of care:   SKILLED NURSING   (*CSW will update this form in Epic as items are completed)   01/12/2013  Patient/family provided with [facility] Department of Clinical Social Work's list of facilities offering this level of care within the geographic area requested by the patient (or if unable, by the patient's family).  01/12/2013  Patient/family informed of their freedom to choose among providers that offer the needed level of care, that participate in Medicare, Medicaid or managed care program needed by the patient, have an available bed and are willing to accept the patient.  01/12/2013  Patient/family informed of [facility]' ownership interest in [facility], as well as of the fact that they are under no obligation to receive care at this facility.  PASARR submitted to EDS on 01/12/2013 PASARR number received from EDS on 01/12/2013  FL2 transmitted to all facilities in geographic area requested by pt/family on  01/12/2013 FL2 transmitted to all facilities within larger geographic area on   Patient informed that his/her managed care company has contracts with or will negotiate with  certain facilities, including the following:     Patient/family informed of bed offers received:   Patient chooses bed at  Physician recommends and patient chooses bed at    Patient to be transferred to  on   Patient to be transferred to facility by   The following physician request were entered in Epic:   Additional Comments:  Maree Krabbe, MSW, [employer] [phone]

## 2013-01-12 NOTE — Progress Notes (Signed)
UR complete.  Caelin Rosen RN, MSN 
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
## 2013-01-12 NOTE — Progress Notes (Signed)
SLP Cancellation Note  Patient Details Name: Charles Lambert MRN: [id number] DOB: 08/31/1927   Cancelled treatment:       Reason Eval/Treat Not Completed: Patient at procedure or test/unavailable. Will return today as schedule allows.   Maxcine Ham 01/12/2013, 11:17 AM  Maxcine Ham, M.A. CCC-SLP [phone]

## 2013-01-12 NOTE — Progress Notes (Signed)
Subjective: Much more awake today after increasing Keppra yesterday evening  Exam: Filed Vitals:   01/12/13 1024  BP: 163/69  Pulse: 60  Temp: 98.4 F (36.9 C)  Resp: 18   Gen: In chair, NAD MS: Awake, Alert, follows commands quickly, but does not use many words.  ZO:XWRUE, EOMI, blinks to threat Motor: 5/5 throughout Sensory:intact to lt  Impression: 79 yo M with dementia presenting with episode of unresponsiveness which I presume to be seizure. There was a single sharp wave on his EEG suggesting an epileptic predisposition which I feel is related to his dementia.  Given persistent confusion and sedation, I increased his Keppra last evening and he appears much more awake today.   Recommendations: 1) continuous EEG hopefully captured change to less responsive state 2) continue Keppra 500 twice a day  Ritta Slot, MD [facility] [phone]  If 7pm- 7am, please page neurology on call at [phone].

## 2013-01-12 NOTE — Progress Notes (Signed)
Physical Therapy Treatment Patient Details Name: Charles Lambert MRN: [id number] DOB: 04/21/1927 Today's Date: 01/12/2013 Time: 0935-1000 PT Time Calculation (min): 25 min  PT Assessment / Plan / Recommendation  History of Present Illness Patient is an 79 yo male admitted with AMS - acute encephalopathy and ? seizures.  Patient with h/o dementia.   PT Comments   Pt well known to this PT from previous admit.  Pt's cognition appears to be same as previous admit and mobility decreased from mobility status at last D/C.  Pt would benefit from returning to SNF level of care.  Spoke with Nsg about sitting pt up to increase arousal and participation with tasks, as pt needed this previuos admit.    Follow Up Recommendations  SNF     Does the patient have the potential to tolerate intense rehabilitation     Barriers to Discharge        Equipment Recommendations  None recommended by PT    Recommendations for Other Services    Frequency Min 2X/week   Progress towards PT Goals Progress towards PT goals: Progressing toward goals  Plan Frequency needs to be updated    Precautions / Restrictions Precautions Precautions: Fall Restrictions Weight Bearing Restrictions: No   Pertinent Vitals/Pain Did not indicate pain.      Mobility  Bed Mobility Bed Mobility: Supine to Sit;Sitting - Scoot to Edge of Bed Supine to Sit: 2: Max assist Sitting - Scoot to [place] of Bed: 2: Max assist Details for Bed Mobility Assistance: pt drowsy while in bed, though once PT initiates mobility pt does help some, but does require a Max level of A.   Transfers Transfers: Sit to Stand;Stand to [employer] Transfers Sit to Stand: 3: Mod assist;From bed Stand to Sit: 3: Mod assist;With upper extremity assist;To chair/3-in-1 Stand Pivot Transfers: 2: Max assist Details for Transfer Assistance: cues for use of UEs as pt is used to pulling up on CG at ALF.  At times needs hand over hand cues along with verbal  cueing.   Ambulation/Gait Ambulation/Gait Assistance: Not tested (comment) Stairs: No Wheelchair Mobility Wheelchair Mobility: No    Exercises     PT Diagnosis:    PT Problem List:   PT Treatment Interventions:     PT Goals (current goals can now be found in the care plan section) Acute Rehab PT Goals Time For Goal Achievement: 01/25/13 Potential to Achieve Goals: Fair  Visit Information  Last PT Received On: 01/12/13 Assistance Needed: +2 History of Present Illness: Patient is an 79 yo male admitted with AMS - acute encephalopathy and ? seizures.  Patient with h/o dementia.    Subjective Data  Subjective: pt drowsy while supine, but conversive once sitting.     Cognition  Cognition Arousal/Alertness:  (Drowsy supine, but awakens in sitting.  ) Behavior During Therapy: Flat affect Overall Cognitive Status: No family/caregiver present to determine baseline cognitive functioning General Comments: pt well known to this PT from previous admission and cognition appears to be at same level as previously.      Balance  Balance Balance Assessed: Yes Static Sitting Balance Static Sitting - Balance Support: Bilateral upper extremity supported;Feet supported Static Sitting - Level of Assistance: 5: Stand by assistance  End of Session PT - End of Session Equipment Utilized During Treatment: Gait belt Activity Tolerance: Patient limited by fatigue Patient left: in chair;with call bell/phone within reach Nurse Communication: Mobility status (Requested chair alarm and A with breakfast.  )  GP     Gyneth Hubka, Alison Murray, [place] [phone] 01/12/2013, 11:18 AM

## 2013-01-12 NOTE — Progress Notes (Addendum)
CSW spoke to patient's son Pinchas over the phone about SNF placement. Patient's son expressed to CSW that he does not understand why the patient needs SNF when he can get pt at the ALF. CSW explained to patient's son that snf have 24/7 nursing staff, son states he understands. CSW spoke to [facility], who stated they can take patient as long as he is without the green mittens for 24 hours before admission (as they consider them restraints) . CSW called [facility] and spoke with Wyline Mood, Charity fundraiser. Dotty explained to CSW that patient is from the Memory Care Unit, who does have a nurse at all times and will be able to provide Home Health PT. Patient's son prefers that the patient return back to ALF Memory Care unit.  Maree Krabbe, MSW, Theresia Majors [phone]

## 2013-01-12 NOTE — Progress Notes (Signed)
LTVM EEG initiated. 

## 2013-01-12 NOTE — Progress Notes (Signed)
Pt. Very sleepy when attempting to get back in bed for EEG hookup but was able to assist with standing.   Pt. Now back in bed and will not open eyes, will not eat; does however resist when this RN checks pupils and will squeeze hands although not on command. Will continue to monitor.

## 2013-01-13 LAB — CULTURE, BLOOD (ROUTINE X 2)
Culture: NO GROWTH
Culture: NO GROWTH
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
## 2013-01-13 NOTE — Progress Notes (Signed)
Occupational Therapy Treatment Patient Details Name: Charles Lambert MRN: [id number] DOB: [birth year] Today's Date: 01/13/2013 Time: 4098-1191 OT Time Calculation (min): 14 min  OT Assessment / Plan / Recommendation  History of present illness Patient is an 79 yo male admitted with AMS - acute encephalopathy and ? seizures.  Patient with h/o dementia.   OT comments  Pt. Awake upon arrival.  Attempted self feeding.  Pt. Unable to sequence steps to complete feeding.  Max hand over hand assistance and re-direction required for bringing spoon to mouth to complete a bite.  Pt. Pushing food out of his mouth when it reached his lips, appears unable to sequence bite and swallow consistently.  Max assist and to keep hands away from head.  Protective mits re-applied and discussed with r.n. Pt.s need for full assistance for remainder of breakfast.    Follow Up Recommendations  SNF;Supervision/Assistance - 24 hour           Equipment Recommendations  3 in 1 bedside comode        Frequency Min 1X/week   Progress towards OT Goals Progress towards OT goals: Not progressing toward goals - comment (limitations with following commands)  Plan Discharge plan remains appropriate    Precautions / Restrictions Precautions Precautions: Fall   Pertinent Vitals/Pain Unable to rate but did not visibly appear in distress    ADL  Eating/Feeding: +1 Total assistance ADL Comments: attempted self feeding trial with removal of right protective mitt, pt. con't. to grab at head and required max hand over hand asssitance for re-direction.  placed spoon in right hand, pt. attempted scooping yogurt x2 but unable to bring spoon to mouth without assistance.  con't. with spontanious movements with spoon but not sequenced for self feeding.  con't. to pull at head.  placed mit back on for pt. safety to protect head and discussed with rn pt. would need assistance to cont. eating breakfast.  less spontanious movements during  feeding with mits on.      OT Goals(current goals can now be found in the care plan section)    Visit Information  Last OT Received On: 01/13/13 History of Present Illness: Patient is an 79 yo male admitted with AMS - acute encephalopathy and ? seizures.  Patient with h/o dementia.                 Cognition  Cognition Behavior During Therapy: Flat affect Overall Cognitive Status: No family/caregiver present to determine baseline cognitive functioning Area of Impairment: Orientation;Attention;Safety/judgement;Following commands Orientation Level: Disoriented to;Person;Time;Situation;Place Current Attention Level: Focused Memory: Decreased recall of precautions;Decreased short-term memory Following Commands: Follows one step commands inconsistently Safety/Judgement: Decreased awareness of safety;Decreased awareness of deficits Awareness: Intellectual;Emergent;Anticipatory                      End of Session OT - End of Session Activity Tolerance: Patient tolerated treatment well Patient left: in bed;with call bell/phone within reach;with bed alarm set Nurse Communication: Other (comment) (reviewed need for feeding assistance for remaining breakfast)       Robet Leu, COTA/L 01/13/2013, 7:47 AM

## 2013-01-13 NOTE — Procedures (Signed)
History: 79 year old male with waxing and waning mental status  This is a continuous EEG recording the record from 12/12/2012 11:55 AM until 01/13/2013 11:59 PM.  Background: Even during periods of maximal wakefulness, the background does have some delta and theta slowing. The posterior dominant rhythm that she use a maximal frequency of 7 Hz. During sleep,  symmetric sleep structures are observed.   Photic stimulation: Physiologic driving is absent  EEG Abnormalities: Generalized slow activity  Clinical Interpretation: This EEG is consistent with a mild nonspecific cerebral dysfunction. Compared to the previous recording no sharp waves were recorded. Of note, Keppra can have a suppressing effect on sharp waves. There was no seizure or seizure predisposition recorded on this study.   Ritta Slot, MD [facility] [phone]  If 7pm- 7am, please page neurology on call at [phone].

## 2013-01-13 NOTE — Progress Notes (Addendum)
Subjective: More awake this morning, but more somewhat as the day goes on the  Exam: Filed Vitals:   01/13/13 1302  BP: 149/65  Pulse: 60  Temp:   Resp:    Gen: In bed, NAD MS: Somnolent but arousable, answers some questions, follows commands. CN: Pupils equal round and reactive, extraocular movements intact, blinks to threat bilaterally Motor: Good strength in bilateral upper extremities Sensory: Response to touch in bilateral arms   Impression: 79 yo M with dementia presenting with episode of unresponsiveness which I presumed to be seizure. There was a single sharp wave on his EEG suggesting an epileptic predisposition which I feel is related to his dementia. Continuous EEG spot review demonstrates no concerning activity, but a full review will not be performed until later today.  I am still not certain that his episodes of unresponsiveness were seizures, but with abnormal EEG I would favor continuing to treat.   Recommendations: 1) continue Keppra 500 mg twice a day 2) if sedation continues to be a problem for the patient, could consider low-dose Provigil 50-100 mg in the morning in the future.  Ritta Slot, MD [facility] [phone]  If 7pm- 7am, please page neurology on call at [phone].
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
## 2013-01-13 NOTE — Progress Notes (Signed)
Patient appearing sleepy today, would not wake enough for 10:00 meds, had a period of wakefulness/hunger with daughter-in-law and asked for snacks, then since then has remained quite sleepy. MD kirkpatrick made aware. No orders at this time

## 2013-01-13 NOTE — Progress Notes (Signed)
CSW received phone call from [facility] after reviewing patient's clinicals. Dotty from [facility] stated that they are able to take the patient back when he is medically ready.  Maree Krabbe, MSW, [facility] [phone]

## 2013-01-13 NOTE — Progress Notes (Signed)
Attempted to remove patient's green mitts this morning, and he immediately began trying to pull off his 24-hr EEG wires. Will attempt removal again at least when 24 hr EEG is finished

## 2013-01-14 LAB — CREATININE, SERUM: GFR calc non Af Amer: 64 mL/min — ABNORMAL LOW (ref >90)

## 2013-01-14 MED ORDER — SODIUM CHLORIDE 0.9 % IV SOLN
250.0000 mg | Freq: Two times a day (BID) | INTRAVENOUS | Status: DC
  Filled 2013-01-14: qty 2.5

## 2013-01-14 MED ORDER — LEVETIRACETAM 250 MG PO TABS
250.0000 mg | ORAL_TABLET | Freq: Two times a day (BID) | ORAL | Status: DC
  Filled 2013-01-14 (×3): qty 1

## 2013-01-14 MED ORDER — MODAFINIL 200 MG PO TABS
100.0000 mg | ORAL_TABLET | Freq: Every day | ORAL | Status: DC
  Administered 2013-01-14: 100 mg via ORAL
  Filled 2013-01-14: qty 1

## 2013-01-14 NOTE — Progress Notes (Signed)
[facility]URO HOSPITALIST PROGRESS NOTE   SUBJECTIVE:                                                                                                                        No new neurological developments. He open his eyes to verbal commands, but still with waxing and waning mental status. On keppra.   OBJECTIVE:                                                                                                                           Vital signs in last 24 hours: Temp:  [97.3 F (36.3 C)-98.2 F (36.8 C)] 97.3 F (36.3 C) (11/05 0540) Pulse Rate:  [59-60] 60 (11/05 0540) Resp:  [20-22] 20 (11/05 0540) BP: (149-159)/(65-94) 157/74 mmHg (11/05 0540) SpO2:  [91 %-99 %] 97 % (11/05 0540)  Intake/Output from previous day: 11/04 0701 - 11/05 0700 In: 540 [P.O.:540] Out: -  Intake/Output this shift:   Nutritional status: Cardiac  Past Medical History  Diagnosis Date  . Dementia   . Subdural hematoma   . Prostate enlargement   . Pacemaker   . Sinoatrial node dysfunction     Neurologic Exam:  Mental status: open eyes to verbal commands but doesn't communicate or follows commands consistently. CN 2-12: offers a lot of resistance to eyes opening but pupils appear to be symmetric and reactive to light. No gaze preference. EOM full without nystagmus. Face symmetric. Couldn't assess tongue. Motor: seems to move all limbs symmetrically. Sensory: reacts to noxious stimuli. DTRs: 1 all over. Plantars: downgoing. Coordination and gait: unable to test. No meningeal signs.  Lab Results: No results found for this basename: cbc, bmp, coags, chol, tri, ldl, hga1c   Lipid Panel No results found for this basename: CHOL, TRIG, HDL, CHOLHDL, VLDL, LDLCALC,  in the last 72 hours  Studies/Results: No results found.  MEDICATIONS  I have  reviewed the patient's current medications.  ASSESSMENT/PLAN:                                                                                                            Dementia with encephalopathy and symptomatic seizures. Continue keppra but lower dose to 250 mg BID. Avoid sedatives. Will follow up.  Wyatt Portela, MD [facility] [phone]  01/14/2013, 9:03 AM

## 2013-01-14 NOTE — Progress Notes (Addendum)
2:38 PM: CSW spoke to patient's son, who told CSW that he wants patient to go to [facility] now. CSW notified SNF and will notify RN.   CSW received phone call from [facility], stating they are ready for patient whenever he is medically ready.  Maree Krabbe, MSW, [facility] [phone]

## 2013-01-14 NOTE — Progress Notes (Signed)
Patient is more alert at this time. Able to follow commands. Up and assisted to bathroom. Son in room. Up in [place] at this time.

## 2013-01-14 NOTE — Progress Notes (Signed)
[facility]                                                                                Patient Demographics  Charles Lambert, is a 79 y.o. male, DOB - 1927/04/05, WUJ:[id number]  Admit date - 01/07/2013   Admitting Physician Alba Cory, MD  Outpatient Primary MD for the patient is [name], MD  LOS - 7   Chief Complaint  Patient presents with  . Altered Mental Status        Assessment & Plan   Active Problems:   Pacemaker-[employer]   Dementia   Altered mental status   Hypokalemia   Encephalopathy acute   Seizures  Acute encephalopathy/suspect seizures -Neurology consulted and continue to follow.  -Continue Keppra 250mg  IV BID -Ativan caused prolonged sedation, and should not be used -Will add on Low dose of provigil 100mg  Daily -Continuous EEG was done showing no seizures  Hypokalemia  - Repleted, will continue to monitor   Dementia  -Complicated by encephalopathy with possible seizure -Continue namenda and exelon patch  OSA  -ABG does not show CO2 retention.  -Continue nightly CPAP.   Chest congestion/acute diastolic CHF, stable  -Chest x-ray suggested pulmonary edema but patient clinically did not seem like it.  -Compensated.  -May have been secondary to IV fluids that he received over a recent admission.   Anemia  - stable   Recent subdural hematoma, stable   DVT prophylaxis: SCDs  Lines/catheters: PIV  Nutrition: heart healthy diet  Activity: Up with assistance  Code Status: DNR  Family Communication: D/W son Marlan at bedside  Disposition Plan: ALF when patients MS consistently stable  Procedures  EEG  Clinical Interpretation: This EEG is consistent with a mild nonspecific cerebral dysfunction. Compared to the previous recording no sharp waves were recorded. Of note, Keppra can have a suppressing effect on sharp waves. There was no seizure or seizure predisposition recorded on this study.   Consults   Neurology   Lab  Results  Component Value Date   PLT 264 01/09/2013    Medications  Scheduled Meds: . cholecalciferol  1,000 Units Oral Daily  . docusate sodium  100 mg Oral BID  . enoxaparin (LOVENOX) injection  40 mg Subcutaneous Q24H  . finasteride  5 mg Oral QHS  . influenza vac split quadrivalent PF  0.5 mL Intramuscular Tomorrow-1000  . levETIRAcetam  250 mg Intravenous Q12H  . memantine  10 mg Oral BID  . modafinil  100 mg Oral Daily  . pantoprazole  40 mg Oral Daily  . rivastigmine  9.5 mg Transdermal Daily  . saccharomyces boulardii  250 mg Oral BID  . sertraline  25 mg Oral Daily  . simvastatin  40 mg Oral QPM  . tamsulosin  0.4 mg Oral QPC supper  . vitamin B-12  1,000 mcg Oral Daily   Continuous Infusions:  PRN Meds:.acetaminophen, acetaminophen, albuterol  Antibiotics    Anti-infectives   None     Time Spent in minutes   30 minutes   Nickia Boesen D.O. on 01/14/2013 at 11:26 AM  Between 7am to 7pm - Pager - [phone]  After 7pm go to [email or web address] - password  [email or web address]  And look for the night coverage person covering for me after hours  [facility] Office  [phone]    Subjective:   Charles Lambert seen and examined today.  Patient very sleepy and drowsy this morning.  No complaints at this time.  Objective:   Filed Vitals:   01/13/13 1302 01/13/13 2100 01/14/13 0110 01/14/13 0540  BP: 149/65 157/74 159/94 157/74  Pulse: 60 59 60 60  Temp:  98.2 F (36.8 C) 97.8 F (36.6 C) 97.3 F (36.3 C)  TempSrc: Axillary Axillary Axillary Oral  Resp:  20 22 20   Height:      Weight:      SpO2: 91% 96% 99% 97%    Wt Readings from Last 3 Encounters:  01/13/13 87.4 kg (192 lb 10.9 oz)  12/30/12 92.4 kg (203 lb 11.3 oz)  07/15/12 93.895 kg (207 lb)     Intake/Output Summary (Last 24 hours) at 01/14/13 1126 Last data filed at 01/13/13 1830  Gross per 24 hour  Intake    300 ml  Output      0 ml  Net    300 ml    Exam  General: Well  developed, well nourished, NAD, appears stated age  HEENT: NCAT,  mucous membranes moist.  Neck: Supple, no JVD, no masses  Cardiovascular: S1 S2 auscultated, no rubs, murmurs or gallops. Regular rate and rhythm.  Respiratory: Clear to auscultation bilaterally with equal chest rise  Abdomen: Soft, nontender, nondistended, + bowel sounds  Extremities: warm dry without cyanosis clubbing or edema  Neuro: Drowsy, however awakens to tactile stimulation, no focal neuro deficits  Skin: Without rashes exudates or nodules  Psych: Unable to assess  Data Review   Micro Results Recent Results (from the past 240 hour(s))  CULTURE, BLOOD (ROUTINE X 2)     Status: None   Collection Time    01/07/13 12:10 PM      Result Value Range Status   Specimen Description BLOOD LEFT ANTECUBITAL   Final   Special Requests BOTTLES DRAWN AEROBIC AND ANAEROBIC 10CC   Final   Culture  Setup Time     Final   Value: 01/07/2013 23:39     Performed at [facility]   Culture     Final   Value: NO GROWTH 5 DAYS     Performed at [facility]   Report Status 01/13/2013 FINAL   Final  CULTURE, BLOOD (ROUTINE X 2)     Status: None   Collection Time    01/07/13 12:15 PM      Result Value Range Status   Specimen Description BLOOD HAND LEFT   Final   Special Requests BOTTLES DRAWN AEROBIC ONLY 10CC   Final   Culture  Setup Time     Final   Value: 01/07/2013 23:38     Performed at [facility]   Culture     Final   Value: NO GROWTH 5 DAYS     Performed at [facility]   Report Status 01/13/2013 FINAL   Final    Radiology Reports Ct Head Wo Contrast  01/07/2013   CLINICAL DATA:  Altered mental status.  EXAM: CT HEAD WITHOUT CONTRAST  TECHNIQUE: Contiguous axial images were obtained from the base of the skull through the vertex without intravenous contrast.  COMPARISON:  12/24/2012.  FINDINGS: Prior burr-hole PROCEDURE for drainage of left-sided subdural hematoma.  No  intracranial hemorrhage.  Small vessel disease type changes without CT  evidence of large acute infarct.  Global atrophy.  No intracranial mass lesion noted on this unenhanced exam.  Vascular calcifications.  IMPRESSION: No acute abnormality. Please see above.   Electronically Signed   By: Bridgett Larsson M.D.   On: 01/07/2013 13:42   Ct Head Wo Contrast  12/24/2012   CLINICAL DATA:  Confusion.  EXAM: CT HEAD WITHOUT CONTRAST  TECHNIQUE: Contiguous axial images were obtained from the base of the skull through the vertex without intravenous contrast.  COMPARISON:  12/22/2012 and 06/16/2012.  FINDINGS: Prior for burrhole procedure for drainage of left-sided subdural hematoma as per history provided.  No intracranial hemorrhage.  Small vessel disease type changes without CT evidence of large acute infarct.  Global atrophy without hydrocephalus.  No intracranial mass lesion noted on this unenhanced exam.  Vascular calcifications.  IMPRESSION: No acute abnormality. Please see above.   Electronically Signed   By: Bridgett Larsson M.D.   On: 12/24/2012 12:54   Ct Head Wo Contrast  12/22/2012   CLINICAL DATA:  Altered mental status. Fall. Head trauma. Prior subdural hematoma.  EXAM: CT HEAD WITHOUT CONTRAST  TECHNIQUE: Contiguous axial images were obtained from the base of the skull through the vertex without intravenous contrast.  COMPARISON:  06/16/2012  FINDINGS: There is no evidence of intracranial hemorrhage, brain edema, or other signs of acute infarction. There is no evidence of intracranial mass lesion or mass effect. No abnormal extraaxial fluid collections are identified.  Mild to moderate diffuse cerebral atrophy is stable as well as mild chronic small vessel disease. Ventricles are stable in size. No evidence of acute skull fracture. 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  IMPRESSION: No acute findings.  Stable cerebral atrophy and chronic small vessel disease.   Electronically Signed   By: Myles Rosenthal M.D.   On: 12/22/2012 21:34   Ir US Guide Vasc Access Right  12/29/2012   CLINICAL DATA:  Encephalitis and need for venous access. A previously placed PICC line has been completely removed by the patient.  EXAM: RIGHT UPPER EXTREMITY PICC LINE PLACEMENT WITH ULTRASOUND AND FLUOROSCOPIC GUIDANCE  FLUOROSCOPY TIME:  12 seconds.  PROCEDURE: The patient's son was advised of the possible risks and complications and agreed to undergo the procedure. The patient was then brought to the angiographic suite for the procedure.  The right arm was prepped with chlorhexidine, draped in the usual sterile fashion using maximum barrier technique (cap and mask, sterile gown, sterile gloves, large sterile sheet, hand hygiene and cutaneous antisepsis) and infiltrated locally with 1% Lidocaine.  Ultrasound demonstrated patency of the right basilic vein, and this was documented with an image. Under real-time ultrasound guidance, this vein was accessed with a 21 gauge micropuncture needle and image documentation was performed. A 0.018 wire was introduced in to the vein. Over this, a 5 [place] dual lumen power injectable PICC cut to 41 cm length was advanced to the lower SVC/right atrial junction. Fluoroscopy during the procedure and fluoro spot radiograph confirms appropriate catheter position. The catheter was flushed and covered with a sterile dressing.  Complications: None  IMPRESSION: Successful right arm power injectable PICC line placement with ultrasound and fluoroscopic guidance. The catheter is ready for use.   Electronically Signed   By: Irish Lack M.D.   On: 12/29/2012 14:05   Dg Chest Port 1 View  01/09/2013   CLINICAL DATA:  Pulmonary edema  EXAM: PORTABLE CHEST - 1 VIEW  COMPARISON:  January 07, 2013  FINDINGS: There  is an overall increase in interstitial and patchy alveolar edema. Heart is mildly enlarged. There are pleural effusions bilaterally. Pacemaker leads are attached to the right atrium and right  ventricle. No adenopathy. No pneumothorax.  IMPRESSION: Congestive heart failure with overall slight increase in edema compared to 2 days prior.   Electronically Signed   By: Bretta Bang M.D.   On: 01/09/2013 08:15   Dg Chest Portable 1 View  01/07/2013   CLINICAL DATA:  Altered mental status.  EXAM: PORTABLE CHEST - 1 VIEW  COMPARISON:  12/29/2012 and 12/28/2012.  FINDINGS: Right PICC line has been removed.  Sequential pacemaker remains in place with leads unchanged in position.  Pulmonary edema. Cardiomegaly. No gross pneumothorax.  Limited for evaluating for underlying infiltrate or mass.  Calcified tortuous aorta.  IMPRESSION: Right PICC line has been removed.  Sequential pacemaker remains in place with leads unchanged in position.  Pulmonary edema. Cardiomegaly.   Electronically Signed   By: Bridgett Larsson M.D.   On: 01/07/2013 11:49   Dg Chest Port 1 View  12/28/2012   CLINICAL DATA:  PICC line placement.  EXAM: PORTABLE CHEST - 1 VIEW  COMPARISON:  12/26/2012.  FINDINGS: The right PICC line tip is approximately 3 cm below the region of the cavoatrial junction and in the right atrium. The pacer wires are stable. Stable cardiac enlargement, vascular congestion, bibasilar infiltrates and pleural effusions.  IMPRESSION: The right PICC line tip is in the right atrium approximately 3 cm below the region of the cavoatrial junction.   Electronically Signed   By: Loralie Champagne M.D.   On: 12/28/2012 12:55   Dg Chest Port 1 View  12/26/2012   CLINICAL DATA:  Cough and congestion.  EXAM: PORTABLE CHEST - 1 VIEW  COMPARISON:  12/22/2012.  FINDINGS: The heart is upper limits of normal and stable. The mediastinal and hilar contours are unchanged. There is tortuosity and calcification of the thoracic aorta. The pacer wires are stable. There are bibasilar infiltrates and small effusions.  IMPRESSION: Bibasilar infiltrates and small effusions.   Electronically Signed   By: Loralie Champagne M.D.   On:  12/26/2012 18:25   Dg Chest Port 1 View  12/22/2012   CLINICAL DATA:  Altered mental status. Weakness.  EXAM: PORTABLE CHEST - 1 VIEW  COMPARISON:  06/16/2012  FINDINGS: Low lung volumes noted. Heart size within normal limits. Both lungs are clear. Dual lead transvenous pacemaker remains in appropriate position.  IMPRESSION: No acute findings.   Electronically Signed   By: Myles Rosenthal M.D.   On: 12/22/2012 20:17   Ir Fluoro Guide Cv Midline Picc Right  12/29/2012   CLINICAL DATA:  Encephalitis and need for venous access. A previously placed PICC line has been completely removed by the patient.  EXAM: RIGHT UPPER EXTREMITY PICC LINE PLACEMENT WITH ULTRASOUND AND FLUOROSCOPIC GUIDANCE  FLUOROSCOPY TIME:  12 seconds.  PROCEDURE: The patient's son was advised of the possible risks and complications and agreed to undergo the procedure. The patient was then brought to the angiographic suite for the procedure.  The right arm was prepped with chlorhexidine, draped in the usual sterile fashion using maximum barrier technique (cap and mask, sterile gown, sterile gloves, large sterile sheet, hand hygiene and cutaneous antisepsis) and infiltrated locally with 1% Lidocaine.  Ultrasound demonstrated patency of the right basilic vein, and this was documented with an image. Under real-time ultrasound guidance, this vein was accessed with a 21 gauge micropuncture needle and image documentation was  performed. A 0.018 wire was introduced in to the vein. Over this, a 5 [place] dual lumen power injectable PICC cut to 41 cm length was advanced to the lower SVC/right atrial junction. Fluoroscopy during the procedure and fluoro spot radiograph confirms appropriate catheter position. The catheter was flushed and covered with a sterile dressing.  Complications: None  IMPRESSION: Successful right arm power injectable PICC line placement with ultrasound and fluoroscopic guidance. The catheter is ready for use.   Electronically Signed    By: Irish Lack M.D.   On: 12/29/2012 14:05   Dg Fluoro Guide Lumbar Puncture  12/25/2012   CLINICAL DATA:  Fever. Altered mental status.  EXAM: DIAGNOSTIC LUMBAR PUNCTURE UNDER FLUOROSCOPIC GUIDANCE  FLUOROSCOPY TIME:  45 seconds.  PROCEDURE: Informed consent was obtained from the patient prior to the procedure, including potential complications of headache, allergy, and pain. With the patient prone, the lower back was prepped with Betadine. 1% Lidocaine was used for local anesthesia. Lumbar puncture was performed at the L3-4 level using a gauge needle with return of clear CSF with an opening pressure of 21 cm water. 11.76ml of CSF were obtained for laboratory studies. The patient tolerated the procedure well and there were no apparent complications.  IMPRESSION: Successful lumbar puncture as above.   Electronically Signed   By: Drusilla Kanner M.D.   On: 12/25/2012 11:31    CBC  Recent Labs Lab 01/07/13 1210 01/09/13 0649  WBC 5.9 10.2  HGB 11.6* 12.1*  HCT 34.9* 36.1*  PLT 257 264  MCV 92.8 92.8  MCH 30.9 31.1  MCHC 33.2 33.5  RDW 13.6 13.4  LYMPHSABS 0.8  --   MONOABS 0.5  --   EOSABS 0.2  --   BASOSABS 0.0  --     Chemistries   Recent Labs Lab 01/07/13 1210 01/08/13 0605 01/09/13 0649 01/10/13 0535 01/11/13 0540 01/12/13 0500 01/14/13 0630  NA 139 140 141 140 137 142  --   K 3.5 3.4* 3.5 3.1* 3.2* 4.2  --   CL 103 105 102 100 100 105  --   CO2 27 25 27 28 29 29   --   GLUCOSE 98 84 81 76 114* 102*  --   BUN 9 9 11 13 13 11   --   CREATININE 1.11 1.03 1.08 1.09 1.07 1.15 1.05  CALCIUM 8.8 8.5 8.8 8.8 8.7 8.9  --   MG  --   --   --   --   --  1.8  --   AST 29  --   --   --   --   --   --   ALT 25  --   --   --   --   --   --   ALKPHOS 52  --   --   --   --   --   --   BILITOT 0.5  --   --   --   --   --   --    ------------------------------------------------------------------------------------------------------------------ estimated creatinine clearance is  56.8 ml/min (by C-G formula based on Cr of 1.05). ------------------------------------------------------------------------------------------------------------------ No results found for this basename: HGBA1C,  in the last 72 hours ------------------------------------------------------------------------------------------------------------------ No results found for this basename: CHOL, HDL, LDLCALC, TRIG, CHOLHDL, LDLDIRECT,  in the last 72 hours ------------------------------------------------------------------------------------------------------------------ No results found for this basename: TSH, T4TOTAL, FREET3, T3FREE, THYROIDAB,  in the last 72 hours ------------------------------------------------------------------------------------------------------------------ No results found for this basename: VITAMINB12, FOLATE, FERRITIN, TIBC, IRON, RETICCTPCT,  in the last 72 hours  Coagulation profile  Recent Labs Lab 01/07/13 1210  INR 1.20    No results found for this basename: DDIMER,  in the last 72 hours  Cardiac Enzymes  Recent Labs Lab 01/07/13 1210  TROPONINI <0.30   ------------------------------------------------------------------------------------------------------------------ No components found with this basename: POCBNP,

## 2013-01-15 ENCOUNTER — Other Ambulatory Visit: Payer: Self-pay

## 2013-01-15 DIAGNOSIS — R404 Transient alteration of awareness: Secondary | ICD-10-CM

## 2013-01-15 DIAGNOSIS — R509 Fever, unspecified: Secondary | ICD-10-CM

## 2013-01-15 DIAGNOSIS — D649 Anemia, unspecified: Secondary | ICD-10-CM

## 2013-01-15 LAB — BASIC METABOLIC PANEL
BUN: 9 mg/dL (ref 6–23)
Calcium: 9.2 mg/dL (ref 8.4–10.5)
Chloride: 103 mEq/L (ref 96–112)
Creatinine, Ser: 1.21 mg/dL (ref 0.50–1.35)
GFR calc Af Amer: 62 mL/min — ABNORMAL LOW (ref >90)
Glucose, Bld: 97 mg/dL (ref 70–99)
Potassium: 3.6 mEq/L (ref 3.5–5.1)

## 2013-01-15 MED ORDER — MODAFINIL 200 MG PO TABS: 50.0000 mg | ORAL_TABLET | Freq: Every day | ORAL | Status: DC

## 2013-01-15 MED ORDER — MODAFINIL 100 MG PO TABS: 50.0000 mg | ORAL_TABLET | Freq: Every day | ORAL | Status: DC

## 2013-01-15 MED ORDER — LEVETIRACETAM 250 MG PO TABS: 250.0000 mg | ORAL_TABLET | Freq: Two times a day (BID) | ORAL | Status: DC

## 2013-01-15 NOTE — Discharge Summary (Signed)
Physician Discharge Summary  Charles Lambert:[id number] DOB: [birth year] DOA: 01/07/2013  PCP: [name], MD  Admit date: 01/07/2013 Discharge date: 01/15/2013  Time spent: 45 minutes  Recommendations for Outpatient Follow-up:  Followup with Dr. Redmond School within one week of discharge. Patient should also follow up with the primary care physician at the nursing home within one to 2 days of arrival. Patient to continue taking his medication as prescribed. Patient was started on a low dose of Provigil and this should be monitored.  Discharge Diagnoses:  Active Problems:   Acute encephalopathy with possible seizure   Pacemaker-[employer]   Dementia   Altered mental status   Hypokalemia   Seizures   Obstructive sleep apnea   Discharge Condition: Stable  Diet recommendation: Heart Healthy  Filed Weights   01/12/13 0500 01/13/13 0505 01/15/13 0500  Weight: 89.585 kg (197 lb 8 oz) 87.4 kg (192 lb 10.9 oz) 88.2 kg (194 lb 7.1 oz)    History of present illness:  Charles Lambert is a 79 y.o. male with PMH significant for subdural hematoma, dementia, last admission to hospital on 10-13 for AMS and fever at 102. He was treated at that time for presume encephalitis, meningitis. He was discharge to SNF to finish IV antibiotics. He finish antibiotics and he was ready to be discharge today from SNF when he developed acute AMS. He was being evaluated by discharge nurse at 10 am when he was notice to be non responsive, no talkative. He grab nurse arm, but he was not following command. Per son patient has been able to walk, carry a conversation, follow command prior this event. In the ED patient was unresponsive, he grunts to stimuli.  He is answering question, he knows that he is at [facility]. He doesn't remember what happen today. He denies abdominal pain, dyspnea. No tremors witness by SNF staff.   Hospital Course:  This is an 79 year old with past medical history of subdural hematoma,  dementia that was recently admitted for altered mental status. Patient was admitted in October 2014 with presumed encephalitis versus meningitis. He was sent to a nursing home to finish IV antibiotics. Will nursing home patient developed acute altered mental status, patient had become nonproductive as well as nonresponsive. Patient was admitted for altered mental status with questionable seizures. Neurology was consulted and patient was started on Keppra.  During his stay patient did have some agitation and was given a small dose of Ativan at 0.5 however this was too much for the patient. Ativan should not be given to this patient. Patient had become very dependent. Patient did remain somewhat somnolent during his hospital course. Neurology did recommend using Provigil. Patient was on 100 mg of Provigil however this will be decreased to 50 mg daily. This should be followed up on him by patient's primary care provider as well as a physician in the nursing home.  Patient also had a continuous EEG which demonstrated  mild nonspecific cerebral dysfunction. Patient did not have seizure or a seizure predisposition on the study. Patient also had hypokalemia during his hospital course this was repleted and monitored. As for his dementia he was continued on Namenda as well as Exelon. Patient did use CPAP for his obstructive sleep apnea.  Patient also has a history of anemia however this did remain stable during his stay. Patient remained hemodynamically stable with no active signs of bleeding. Patient did have a chest congestion during his hospital course with acute diastolic CHF. This was  thought to be secondary to IV fluids he received during admission. Patient did have some IV Lasix and this did resolve. Patient did not have any shortness of breath. On day of discharge patient was seen examined and does appear to be more alert. He is able to follow some commands and has no complaints at this time.  Procedures: EEG   Clinical Interpretation: This EEG is consistent with a mild nonspecific cerebral dysfunction. Compared to the previous recording no sharp waves were recorded. Of note, Keppra can have a suppressing effect on sharp waves. There was no seizure or seizure predisposition recorded on this study.   Consultations: Neurology  Discharge Exam: Filed Vitals:   01/15/13 0537  BP: 143/62  Pulse: 60  Temp: 98.1 F (36.7 C)  Resp: 14    Exam  General: Well developed, well nourished, NAD, appears stated age  HEENT: NCAT, mucous membranes moist.  Neck: Supple, no JVD, no masses  Cardiovascular: S1 S2 auscultated, no rubs, murmurs or gallops. Regular rate and rhythm.  Respiratory: Clear to auscultation bilaterally with equal chest rise  Abdomen: Soft, nontender, nondistended, + bowel sounds  Extremities: warm dry without cyanosis clubbing or edema  Neuro: Drowsy, however awakens to tactile and verbal stimulation, no focal neuro deficits, able to follow commands and answer some questions Skin: Without rashes exudates or nodules  Psych: Unable to assess  Discharge Instructions  Discharge Orders   Future Orders Complete By Expires   Diet - low sodium heart healthy  As directed    Discharge instructions  As directed    Comments:     Followup with Dr. Redmond School within one week of discharge. Patient should also follow up with the primary care physician at the nursing home within one to 2 days of arrival. Patient to continue taking his medication as prescribed. Patient was started on a low dose of Provigil and this should be monitored.   Increase activity slowly  As directed        Medication List         cholecalciferol 1000 UNITS tablet  Commonly known as:  VITAMIN D  Take 1,000 Units by mouth daily.     finasteride 5 MG tablet  Commonly known as:  PROSCAR  Take 5 mg by mouth at bedtime.     levETIRAcetam 250 MG tablet  Commonly known as:  KEPPRA  Take 1 tablet (250 mg total) by mouth 2  (two) times daily.     memantine 10 MG tablet  Commonly known as:  NAMENDA  Take 10 mg by mouth 2 (two) times daily.     modafinil 100 MG tablet  Commonly known as:  PROVIGIL  Take 0.5 tablets (50 mg total) by mouth daily.     omeprazole 20 MG capsule  Commonly known as:  PRILOSEC  Take 20 mg by mouth daily.     rivastigmine 9.5 mg/24hr  Commonly known as:  EXELON  Place 1 patch onto the skin daily.     saccharomyces boulardii 250 MG capsule  Commonly known as:  FLORASTOR  Take 250 mg by mouth 2 (two) times daily.     sertraline 25 MG tablet  Commonly known as:  ZOLOFT  Take 25 mg by mouth daily.     simvastatin 40 MG tablet  Commonly known as:  ZOCOR  Take 40 mg by mouth every evening.     tamsulosin 0.4 MG Caps capsule  Commonly known as:  FLOMAX  Take 0.4 mg by mouth  daily after supper.     traMADol 50 MG tablet  Commonly known as:  ULTRAM  Take 25 mg by mouth every 6 (six) hours as needed for pain. Take 1/2 tab every 6 hours as needed for pain     vitamin B-12 1000 MCG tablet  Commonly known as:  CYANOCOBALAMIN  Take 1,000 mcg by mouth daily.       No Known Allergies     Follow-up Information   Follow up with [name], MD. Schedule an appointment as soon as possible for a visit in 1 week.   Specialty:  Family Medicine   Contact information:   [address] [place] [address] [phone]       Follow up with Physician at Nusing home In 1 day.       The results of significant diagnostics from this hospitalization (including imaging, microbiology, ancillary and laboratory) are listed below for reference.    Significant Diagnostic Studies: Ct Head Wo Contrast  01/07/2013   CLINICAL DATA:  Altered mental status.  EXAM: CT HEAD WITHOUT CONTRAST  TECHNIQUE: Contiguous axial images were obtained from the base of the skull through the vertex without intravenous contrast.  COMPARISON:  12/24/2012.  FINDINGS: Prior burr-hole PROCEDURE  for drainage of left-sided subdural hematoma.  No intracranial hemorrhage.  Small vessel disease type changes without CT evidence of large acute infarct.  Global atrophy.  No intracranial mass lesion noted on this unenhanced exam.  Vascular calcifications.  IMPRESSION: No acute abnormality. Please see above.   Electronically Signed   By: Bridgett Larsson M.D.   On: 01/07/2013 13:42   Ct Head Wo Contrast  12/24/2012   CLINICAL DATA:  Confusion.  EXAM: CT HEAD WITHOUT CONTRAST  TECHNIQUE: Contiguous axial images were obtained from the base of the skull through the vertex without intravenous contrast.  COMPARISON:  12/22/2012 and 06/16/2012.  FINDINGS: Prior for burrhole procedure for drainage of left-sided subdural hematoma as per history provided.  No intracranial hemorrhage.  Small vessel disease type changes without CT evidence of large acute infarct.  Global atrophy without hydrocephalus.  No intracranial mass lesion noted on this unenhanced exam.  Vascular calcifications.  IMPRESSION: No acute abnormality. Please see above.   Electronically Signed   By: Bridgett Larsson M.D.   On: 12/24/2012 12:54   Ct Head Wo Contrast  12/22/2012   CLINICAL DATA:  Altered mental status. Fall. Head trauma. Prior subdural hematoma.  EXAM: CT HEAD WITHOUT CONTRAST  TECHNIQUE: Contiguous axial images were obtained from the base of the skull through the vertex without intravenous contrast.  COMPARISON:  06/16/2012  FINDINGS: There is no evidence of intracranial hemorrhage, brain edema, or other signs of acute infarction. There is no evidence of intracranial mass lesion or mass effect. No abnormal extraaxial fluid collections are identified.  Mild to moderate diffuse cerebral atrophy is stable as well as mild chronic small vessel disease. Ventricles are stable in size. No evidence of acute skull fracture. 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  IMPRESSION: No acute findings.  Stable cerebral atrophy and chronic small vessel  disease.   Electronically Signed   By: Myles Rosenthal M.D.   On: 12/22/2012 21:34   Ir US Guide Vasc Access Right  12/29/2012   CLINICAL DATA:  Encephalitis and need for venous access. A previously placed PICC line has been completely removed by the patient.  EXAM: RIGHT UPPER EXTREMITY PICC LINE PLACEMENT WITH ULTRASOUND AND FLUOROSCOPIC GUIDANCE  FLUOROSCOPY  TIME:  12 seconds.  PROCEDURE: The patient's son was advised of the possible risks and complications and agreed to undergo the procedure. The patient was then brought to the angiographic suite for the procedure.  The right arm was prepped with chlorhexidine, draped in the usual sterile fashion using maximum barrier technique (cap and mask, sterile gown, sterile gloves, large sterile sheet, hand hygiene and cutaneous antisepsis) and infiltrated locally with 1% Lidocaine.  Ultrasound demonstrated patency of the right basilic vein, and this was documented with an image. Under real-time ultrasound guidance, this vein was accessed with a 21 gauge micropuncture needle and image documentation was performed. A 0.018 wire was introduced in to the vein. Over this, a 5 [place] dual lumen power injectable PICC cut to 41 cm length was advanced to the lower SVC/right atrial junction. Fluoroscopy during the procedure and fluoro spot radiograph confirms appropriate catheter position. The catheter was flushed and covered with a sterile dressing.  Complications: None  IMPRESSION: Successful right arm power injectable PICC line placement with ultrasound and fluoroscopic guidance. The catheter is ready for use.   Electronically Signed   By: Irish Lack M.D.   On: 12/29/2012 14:05   Dg Chest Port 1 View  01/09/2013   CLINICAL DATA:  Pulmonary edema  EXAM: PORTABLE CHEST - 1 VIEW  COMPARISON:  January 07, 2013  FINDINGS: There is an overall increase in interstitial and patchy alveolar edema. Heart is mildly enlarged. There are pleural effusions bilaterally. Pacemaker leads  are attached to the right atrium and right ventricle. No adenopathy. No pneumothorax.  IMPRESSION: Congestive heart failure with overall slight increase in edema compared to 2 days prior.   Electronically Signed   By: Bretta Bang M.D.   On: 01/09/2013 08:15   Dg Chest Portable 1 View  01/07/2013   CLINICAL DATA:  Altered mental status.  EXAM: PORTABLE CHEST - 1 VIEW  COMPARISON:  12/29/2012 and 12/28/2012.  FINDINGS: Right PICC line has been removed.  Sequential pacemaker remains in place with leads unchanged in position.  Pulmonary edema. Cardiomegaly. No gross pneumothorax.  Limited for evaluating for underlying infiltrate or mass.  Calcified tortuous aorta.  IMPRESSION: Right PICC line has been removed.  Sequential pacemaker remains in place with leads unchanged in position.  Pulmonary edema. Cardiomegaly.   Electronically Signed   By: Bridgett Larsson M.D.   On: 01/07/2013 11:49   Dg Chest Port 1 View  12/28/2012   CLINICAL DATA:  PICC line placement.  EXAM: PORTABLE CHEST - 1 VIEW  COMPARISON:  12/26/2012.  FINDINGS: The right PICC line tip is approximately 3 cm below the region of the cavoatrial junction and in the right atrium. The pacer wires are stable. Stable cardiac enlargement, vascular congestion, bibasilar infiltrates and pleural effusions.  IMPRESSION: The right PICC line tip is in the right atrium approximately 3 cm below the region of the cavoatrial junction.   Electronically Signed   By: Loralie Champagne M.D.   On: 12/28/2012 12:55   Dg Chest Port 1 View  12/26/2012   CLINICAL DATA:  Cough and congestion.  EXAM: PORTABLE CHEST - 1 VIEW  COMPARISON:  12/22/2012.  FINDINGS: The heart is upper limits of normal and stable. The mediastinal and hilar contours are unchanged. There is tortuosity and calcification of the thoracic aorta. The pacer wires are stable. There are bibasilar infiltrates and small effusions.  IMPRESSION: Bibasilar infiltrates and small effusions.   Electronically Signed    By: Luan Pulling.D.  On: 12/26/2012 18:25   Dg Chest Port 1 View  12/22/2012   CLINICAL DATA:  Altered mental status. Weakness.  EXAM: PORTABLE CHEST - 1 VIEW  COMPARISON:  06/16/2012  FINDINGS: Low lung volumes noted. Heart size within normal limits. Both lungs are clear. Dual lead transvenous pacemaker remains in appropriate position.  IMPRESSION: No acute findings.   Electronically Signed   By: Myles Rosenthal M.D.   On: 12/22/2012 20:17   Ir Fluoro Guide Cv Midline Picc Right  12/29/2012   CLINICAL DATA:  Encephalitis and need for venous access. A previously placed PICC line has been completely removed by the patient.  EXAM: RIGHT UPPER EXTREMITY PICC LINE PLACEMENT WITH ULTRASOUND AND FLUOROSCOPIC GUIDANCE  FLUOROSCOPY TIME:  12 seconds.  PROCEDURE: The patient's son was advised of the possible risks and complications and agreed to undergo the procedure. The patient was then brought to the angiographic suite for the procedure.  The right arm was prepped with chlorhexidine, draped in the usual sterile fashion using maximum barrier technique (cap and mask, sterile gown, sterile gloves, large sterile sheet, hand hygiene and cutaneous antisepsis) and infiltrated locally with 1% Lidocaine.  Ultrasound demonstrated patency of the right basilic vein, and this was documented with an image. Under real-time ultrasound guidance, this vein was accessed with a 21 gauge micropuncture needle and image documentation was performed. A 0.018 wire was introduced in to the vein. Over this, a 5 [place] dual lumen power injectable PICC cut to 41 cm length was advanced to the lower SVC/right atrial junction. Fluoroscopy during the procedure and fluoro spot radiograph confirms appropriate catheter position. The catheter was flushed and covered with a sterile dressing.  Complications: None  IMPRESSION: Successful right arm power injectable PICC line placement with ultrasound and fluoroscopic guidance. The catheter is ready  for use.   Electronically Signed   By: Irish Lack M.D.   On: 12/29/2012 14:05   Dg Fluoro Guide Lumbar Puncture  12/25/2012   CLINICAL DATA:  Fever. Altered mental status.  EXAM: DIAGNOSTIC LUMBAR PUNCTURE UNDER FLUOROSCOPIC GUIDANCE  FLUOROSCOPY TIME:  45 seconds.  PROCEDURE: Informed consent was obtained from the patient prior to the procedure, including potential complications of headache, allergy, and pain. With the patient prone, the lower back was prepped with Betadine. 1% Lidocaine was used for local anesthesia. Lumbar puncture was performed at the L3-4 level using a gauge needle with return of clear CSF with an opening pressure of 21 cm water. 11.72ml of CSF were obtained for laboratory studies. The patient tolerated the procedure well and there were no apparent complications.  IMPRESSION: Successful lumbar puncture as above.   Electronically Signed   By: Drusilla Kanner M.D.   On: 12/25/2012 11:31    Microbiology: Recent Results (from the past 240 hour(s))  CULTURE, BLOOD (ROUTINE X 2)     Status: None   Collection Time    01/07/13 12:10 PM      Result Value Range Status   Specimen Description BLOOD LEFT ANTECUBITAL   Final   Special Requests BOTTLES DRAWN AEROBIC AND ANAEROBIC 10CC   Final   Culture  Setup Time     Final   Value: 01/07/2013 23:39     Performed at [facility]   Culture     Final   Value: NO GROWTH 5 DAYS     Performed at [facility]   Report Status 01/13/2013 FINAL   Final  CULTURE, BLOOD (ROUTINE X 2)  Status: None   Collection Time    01/07/13 12:15 PM      Result Value Range Status   Specimen Description BLOOD HAND LEFT   Final   Special Requests BOTTLES DRAWN AEROBIC ONLY 10CC   Final   Culture  Setup Time     Final   Value: 01/07/2013 23:38     Performed at [facility]   Culture     Final   Value: NO GROWTH 5 DAYS     Performed at [facility]   Report Status 01/13/2013 FINAL   Final     Labs: Basic  Metabolic Panel:  Recent Labs Lab 01/09/13 0649 01/10/13 0535 01/11/13 0540 01/12/13 0500 01/14/13 0630 01/15/13 0600  NA 141 140 137 142  --  139  K 3.5 3.1* 3.2* 4.2  --  3.6  CL 102 100 100 105  --  103  CO2 27 28 29 29   --  25  GLUCOSE 81 76 114* 102*  --  97  BUN 11 13 13 11   --  9  CREATININE 1.08 1.09 1.07 1.15 1.05 1.21  CALCIUM 8.8 8.8 8.7 8.9  --  9.2  MG  --   --   --  1.8  --   --    Liver Function Tests: No results found for this basename: AST, ALT, ALKPHOS, BILITOT, PROT, ALBUMIN,  in the last 168 hours No results found for this basename: LIPASE, AMYLASE,  in the last 168 hours No results found for this basename: AMMONIA,  in the last 168 hours CBC:  Recent Labs Lab 01/09/13 0649  WBC 10.2  HGB 12.1*  HCT 36.1*  MCV 92.8  PLT 264   Cardiac Enzymes: No results found for this basename: CKTOTAL, CKMB, CKMBINDEX, TROPONINI,  in the last 168 hours BNP: BNP (last 3 results)  Recent Labs  01/07/13 1210 01/09/13 0649  PROBNP 3754.0* 5496.0*   CBG: No results found for this basename: GLUCAP,  in the last 168 hours     Signed:  Edsel Petrin  [facility] 01/15/2013, 9:53 AM

## 2013-01-15 NOTE — Progress Notes (Signed)
Clinical Social Work Department CLINICAL SOCIAL WORK PLACEMENT NOTE 01/15/2013  Patient:  Charles Lambert, Charles Lambert  Account Number:  [id number] Admit date:  01/07/2013  Clinical Social Worker:  Carren Rang  Date/time:  01/12/2013 03:11 PM  Clinical Social Work is seeking post-discharge placement for this patient at the following level of care:   SKILLED NURSING   (*CSW will update this form in Epic as items are completed)   01/12/2013  Patient/family provided with [facility] Department of Clinical Social Work's list of facilities offering this level of care within the geographic area requested by the patient (or if unable, by the patient's family).  01/12/2013  Patient/family informed of their freedom to choose among providers that offer the needed level of care, that participate in Medicare, Medicaid or managed care program needed by the patient, have an available bed and are willing to accept the patient.  01/12/2013  Patient/family informed of [facility]' ownership interest in [facility], as well as of the fact that they are under no obligation to receive care at this facility.  PASARR submitted to EDS on 01/12/2013 PASARR number received from EDS on 01/12/2013  FL2 transmitted to all facilities in geographic area requested by pt/family on  01/12/2013 FL2 transmitted to all facilities within larger geographic area on   Patient informed that his/her managed care company has contracts with or will negotiate with  certain facilities, including the following:     Patient/family informed of bed offers received:  01/14/2013 Patient chooses bed at [facility] Physician recommends and patient chooses bed at    Patient to be transferred to [facility] on  01/15/2013 Patient to be transferred to facility by EMS  The following physician request were entered in Epic:   Additional Comments:  Maree Krabbe, MSW, [employer] [phone]

## 2013-01-15 NOTE — Progress Notes (Signed)
Clinical Social Worker facilitated patient discharge by contacting the patient, family and facility, [facility]. Patient's son and daughter in law agreeable to this plan and arranging transport via EMS. CSW arranged for transportation pickup at 1pm. CSW will sign off, as social work intervention is no longer needed.  Maree Krabbe, MSW, [facility]ors [phone]

## 2013-01-15 NOTE — Progress Notes (Signed)
Patient transported to [facility] by EMS.

## 2013-01-15 NOTE — Progress Notes (Signed)
UR complete.  Armistead Sult RN, MSN 

## 2013-01-15 NOTE — Progress Notes (Signed)
Report called to Cheri Guppy, RN at [facility].  All questions answered.  Awaiting transport.  Will continue to monitor.

## 2013-01-16 ENCOUNTER — Other Ambulatory Visit: Payer: Self-pay | Admitting: *Deleted

## 2013-01-18 DIAGNOSIS — F0280 Dementia in other diseases classified elsewhere without behavioral disturbance: Secondary | ICD-10-CM | POA: Insufficient documentation

## 2013-01-18 DIAGNOSIS — N400 Enlarged prostate without lower urinary tract symptoms: Secondary | ICD-10-CM | POA: Insufficient documentation

## 2013-01-18 DIAGNOSIS — K219 Gastro-esophageal reflux disease without esophagitis: Secondary | ICD-10-CM | POA: Insufficient documentation

## 2013-01-18 DIAGNOSIS — R531 Weakness: Secondary | ICD-10-CM | POA: Insufficient documentation

## 2013-01-19 ENCOUNTER — Non-Acute Institutional Stay (SKILLED_NURSING_FACILITY): Payer: Medicare Other | Admitting: Internal Medicine

## 2013-01-19 ENCOUNTER — Encounter: Payer: Self-pay | Admitting: Internal Medicine

## 2013-01-19 DIAGNOSIS — K219 Gastro-esophageal reflux disease without esophagitis: Secondary | ICD-10-CM

## 2013-01-19 DIAGNOSIS — F0390 Unspecified dementia without behavioral disturbance: Secondary | ICD-10-CM

## 2013-01-19 DIAGNOSIS — F329 Major depressive disorder, single episode, unspecified: Secondary | ICD-10-CM

## 2013-01-19 DIAGNOSIS — R531 Weakness: Secondary | ICD-10-CM

## 2013-01-19 DIAGNOSIS — F0280 Dementia in other diseases classified elsewhere without behavioral disturbance: Secondary | ICD-10-CM

## 2013-01-19 DIAGNOSIS — R569 Unspecified convulsions: Secondary | ICD-10-CM

## 2013-01-19 DIAGNOSIS — N400 Enlarged prostate without lower urinary tract symptoms: Secondary | ICD-10-CM

## 2013-01-19 DIAGNOSIS — R5381 Other malaise: Secondary | ICD-10-CM

## 2013-01-19 DIAGNOSIS — D649 Anemia, unspecified: Secondary | ICD-10-CM

## 2013-01-19 NOTE — Progress Notes (Signed)
Patient ID: Charles Lambert, male   DOB: 1927-06-08, 79 y.o.   MRN: [id number]  [facility]  PCP: [name], MD  No Known Allergies  Chief Complaint: re-admit post hospitalization 01/07/13- 01/15/13  HPI:  79 y/o male patient is here for STR and was re-hospitalized with decreased responsiveness and confusion. He was initially here for STR after hospital admission for questionable meningitis and was getting his iv antibioitcs. He had finished his antibiotics and was getting ready for discharge when he had the episode of unresponsiveness and was readmitted to the hospital. He has hx of dementia and subdural hematoma. Neurology was consulted and with concerns for possible seizure, he was started on keppra. He had also received a dose of ativan. He was also started on provigil. EEG showed generalized slowing but no epileptiform activities. He slowly improved and was more aleert by time of discharge back to the facility He was seen in his room today with the daughter present. He denies any complaints. As per daughter he has been more awake and alert and participating in conversation. As per staff, he has been having his meals, has unsteady gait but no falls reported, no new skin concerns. Unable to obtain a full ROS from patient. As per staff, pt remains afebrile, needs assistance with ADLs  Review of Systems:  See hpi  Past Medical History  Diagnosis Date  . Dementia   . Subdural hematoma   . Prostate enlargement   . Pacemaker   . Sinoatrial node dysfunction    Past Surgical History  Procedure Laterality Date  . Burr hole     Social History:   reports that he has never smoked. He does not have any smokeless tobacco history on file. He reports that he does not drink alcohol or use illicit drugs.  Family History  Problem Relation Age of Onset  . Hypertension Mother   . Hypertension Father     Medications: Patient's Medications  New Prescriptions   No medications on file   Previous Medications   CHOLECALCIFEROL (VITAMIN D) 1000 UNITS TABLET    Take 1,000 Units by mouth daily.   FINASTERIDE (PROSCAR) 5 MG TABLET    Take 5 mg by mouth at bedtime.    LEVETIRACETAM (KEPPRA) 250 MG TABLET    Take 1 tablet (250 mg total) by mouth 2 (two) times daily.   MEMANTINE (NAMENDA) 10 MG TABLET    Take 10 mg by mouth 2 (two) times daily.   MODAFINIL (PROVIGIL) 100 MG TABLET    Take 0.5 tablets (50 mg total) by mouth daily.   OMEPRAZOLE (PRILOSEC) 20 MG CAPSULE    Take 20 mg by mouth daily.   RIVASTIGMINE (EXELON) 9.5 MG/24HR    Place 1 patch onto the skin daily.   SACCHAROMYCES BOULARDII (FLORASTOR) 250 MG CAPSULE    Take 250 mg by mouth 2 (two) times daily.   SERTRALINE (ZOLOFT) 25 MG TABLET    Take 25 mg by mouth daily.   SIMVASTATIN (ZOCOR) 40 MG TABLET    Take 40 mg by mouth every evening.   TAMSULOSIN HCL (FLOMAX) 0.4 MG CAPS    Take 0.4 mg by mouth daily after supper.    TRAMADOL (ULTRAM) 50 MG TABLET    Take 25 mg by mouth every 6 (six) hours as needed for pain. Take 1/2 tab every 6 hours as needed for pain   VITAMIN B-12 (CYANOCOBALAMIN) 1000 MCG TABLET    Take 1,000 mcg by mouth daily.  Modified Medications   No medications on file  Discontinued Medications   No medications on file     Physical Exam:  97.8, 100/62, 60/min, 18/min, 98% on room air  General- elderly male in no acute distress Head- atraumatic, normocephalic Eyes- PERRLA, EOMI, no pallor, no icterus Neck- no lymphadenopathy, no thyromegaly, no jugular vein distension, no carotid bruit Chest- no chest wall deformities, no chest wall tenderness Cardiovascular- normal s1,s2, no murmurs/ rubs/ gallops Respiratory- bilateral clear to auscultation, no wheeze, no rhonchi, no crackles Abdomen- bowel sounds present, soft, non tender, no organomegaly, no abdominal bruits, no guarding or rigidity, no CVA tenderness Musculoskeletal- able to move all 4 extremities, no spinal and paraspinal tenderness,  unsteady gait Neurological- no focal deficit, generalized weakness Psychiatry- alert and oriented to person only, confusion present at baseline, hx of dementia  Labs reviewed: Basic Metabolic Panel:  Recent Labs  17/61/60 0530 12/26/12 0455  01/11/13 0540 01/12/13 0500 01/14/13 0630 01/15/13 0600  NA 139 138  < > 137 142  --  139  K 3.2* 4.8  < > 3.2* 4.2  --  3.6  CL 103 105  < > 100 105  --  103  CO2 26 25  < > 29 29  --  25  GLUCOSE 96 99  < > 114* 102*  --  97  BUN 18 11  < > 13 11  --  9  CREATININE 1.39* 1.11  < > 1.07 1.15 1.05 1.21  CALCIUM 8.0* 8.0*  < > 8.7 8.9  --  9.2  MG  --  1.8  --   --  1.8  --   --   < > = values in this interval not displayed. Liver Function Tests:  Recent Labs  06/16/12 1332 12/23/12 0359 01/07/13 1210  AST 22 22 29   ALT 13 10 25   ALKPHOS 56 43 52  BILITOT 0.6 0.7 0.5  PROT 6.6 5.8* 6.3  ALBUMIN 3.6 3.1* 3.1*  CBC:  Recent Labs  06/16/12 1332 12/22/12 1955  12/24/12 0608 01/07/13 1210 01/09/13 0649  WBC 5.2 7.1  < > 7.2 5.9 10.2  NEUTROABS 3.7 6.3  --   --  4.3  --   HGB 13.9 12.1*  < > 12.0* 11.6* 12.1*  HCT 41.2 36.4*  < > 35.1* 34.9* 36.1*  MCV 90.9 93.3  < > 91.4 92.8 92.8  PLT 195 176  < > 137* 257 264  < > = values in this interval not displayed. Cardiac Enzymes:  Recent Labs  01/07/13 1210  TROPONINI <0.30   CBG:  Recent Labs  12/29/12 0627 12/30/12 0641 01/07/13 1132  GLUCAP 106* 95 102*    Radiological Exams: Ct Head Wo Contrast  01/07/2013   CLINICAL DATA:  Altered mental status.  EXAM: CT HEAD WITHOUT CONTRAST  TECHNIQUE: Contiguous axial images were obtained from the base of the skull through the vertex without intravenous contrast.  COMPARISON:  12/24/2012.  FINDINGS: Prior burr-hole PROCEDURE for drainage of left-sided subdural hematoma.  No intracranial hemorrhage.  Small vessel disease type changes without CT evidence of large acute infarct.  Global atrophy.  No intracranial mass lesion noted  on this unenhanced exam.  Vascular calcifications.  IMPRESSION: No acute abnormality. Please see above.   Electronically Signed   By: Bridgett Larsson M.D.   On: 01/07/2013 13:42   Ct Head Wo Contrast  12/24/2012   CLINICAL DATA:  Confusion.  EXAM: CT HEAD WITHOUT CONTRAST  TECHNIQUE: Contiguous  axial images were obtained from the base of the skull through the vertex without intravenous contrast.  COMPARISON:  12/22/2012 and 06/16/2012.  FINDINGS: Prior for burrhole procedure for drainage of left-sided subdural hematoma as per history provided.  No intracranial hemorrhage.  Small vessel disease type changes without CT evidence of large acute infarct.  Global atrophy without hydrocephalus.  No intracranial mass lesion noted on this unenhanced exam.  Vascular calcifications.  IMPRESSION: No acute abnormality. Please see above.   Electronically Signed   By: Bridgett Larsson M.D.   On: 12/24/2012 12:54   Ct Head Wo Contrast  12/22/2012   CLINICAL DATA:  Altered mental status. Fall. Head trauma. Prior subdural hematoma.  EXAM: CT HEAD WITHOUT CONTRAST  TECHNIQUE: Contiguous axial images were obtained from the base of the skull through the vertex without intravenous contrast.  COMPARISON:  06/16/2012  FINDINGS: There is no evidence of intracranial hemorrhage, brain edema, or other signs of acute infarction. There is no evidence of intracranial mass lesion or mass effect. No abnormal extraaxial fluid collections are identified.  Mild to moderate diffuse cerebral atrophy is stable as well as mild chronic small vessel disease. Ventricles are stable in size. No evidence of acute skull fracture. 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  IMPRESSION: No acute findings.  Stable cerebral atrophy and chronic small vessel disease.   Electronically Signed   By: Myles Rosenthal M.D.   On: 12/22/2012 21:34   Ir US Guide Vasc Access Right  12/29/2012   CLINICAL DATA:  Encephalitis and need for venous access. A previously placed  PICC line has been completely removed by the patient.  EXAM: RIGHT UPPER EXTREMITY PICC LINE PLACEMENT WITH ULTRASOUND AND FLUOROSCOPIC GUIDANCE  FLUOROSCOPY TIME:  12 seconds.  PROCEDURE: The patient's son was advised of the possible risks and complications and agreed to undergo the procedure. The patient was then brought to the angiographic suite for the procedure.  The right arm was prepped with chlorhexidine, draped in the usual sterile fashion using maximum barrier technique (cap and mask, sterile gown, sterile gloves, large sterile sheet, hand hygiene and cutaneous antisepsis) and infiltrated locally with 1% Lidocaine.  Ultrasound demonstrated patency of the right basilic vein, and this was documented with an image. Under real-time ultrasound guidance, this vein was accessed with a 21 gauge micropuncture needle and image documentation was performed. A 0.018 wire was introduced in to the vein. Over this, a 5 [place] dual lumen power injectable PICC cut to 41 cm length was advanced to the lower SVC/right atrial junction. Fluoroscopy during the procedure and fluoro spot radiograph confirms appropriate catheter position. The catheter was flushed and covered with a sterile dressing.  Complications: None  IMPRESSION: Successful right arm power injectable PICC line placement with ultrasound and fluoroscopic guidance. The catheter is ready for use.   Electronically Signed   By: Irish Lack M.D.   On: 12/29/2012 14:05   Dg Chest Port 1 View  01/09/2013   CLINICAL DATA:  Pulmonary edema  EXAM: PORTABLE CHEST - 1 VIEW COMPARISON:  January 07, 2013  FINDINGS: There is an overall increase in interstitial and patchy alveolar edema. Heart is mildly enlarged. There are pleural effusions bilaterally. Pacemaker leads are attached to the right atrium and right ventricle. No adenopathy. No pneumothorax.  IMPRESSION: Congestive heart failure with overall slight increase in edema compared to 2 days prior.   Electronically  Signed   By: Bretta Bang M.D.   On: 01/09/2013 08:15  Dg Chest Portable 1 View  01/07/2013   CLINICAL DATA:  Altered mental status.  EXAM: PORTABLE CHEST - 1 VIEW  COMPARISON:  12/29/2012 and 12/28/2012.  FINDINGS: Right PICC line has been removed.  Sequential pacemaker remains in place with leads unchanged in position.  Pulmonary edema. Cardiomegaly. No gross pneumothorax.  Limited for evaluating for underlying infiltrate or mass.  Calcified tortuous aorta.  IMPRESSION: Right PICC line has been removed.  Sequential pacemaker remains in place with leads unchanged in position.  Pulmonary edema. Cardiomegaly.   Electronically Signed   By: Bridgett Larsson M.D.   On: 01/07/2013 11:49   Dg Chest Port 1 View  12/28/2012   CLINICAL DATA:  PICC line placement.  EXAM: PORTABLE CHEST - 1 VIEW  COMPARISON:  12/26/2012.  FINDINGS: The right PICC line tip is approximately 3 cm below the region of the cavoatrial junction and in the right atrium. The pacer wires are stable. Stable cardiac enlargement, vascular congestion, bibasilar infiltrates and pleural effusions.  IMPRESSION: The right PICC line tip is in the right atrium approximately 3 cm below the region of the cavoatrial junction.   Electronically Signed   By: Loralie Champagne M.D.   On: 12/28/2012 12:55   Dg Chest Port 1 View  12/26/2012   CLINICAL DATA:  Cough and congestion.  EXAM: PORTABLE CHEST - 1 VIEW  COMPARISON:  12/22/2012.  FINDINGS: The heart is upper limits of normal and stable. The mediastinal and hilar contours are unchanged. There is tortuosity and calcification of the thoracic aorta. The pacer wires are stable. There are bibasilar infiltrates and small effusions.  IMPRESSION: Bibasilar infiltrates and small effusions.   Electronically Signed   By: Loralie Champagne M.D.   On: 12/26/2012 18:25   Dg Chest Port 1 View  12/22/2012   CLINICAL DATA:  Altered mental status. Weakness.  EXAM: PORTABLE CHEST - 1 VIEW  COMPARISON:  06/16/2012   FINDINGS: Low lung volumes noted. Heart size within normal limits. Both lungs are clear. Dual lead transvenous pacemaker remains in appropriate position.  IMPRESSION: No acute findings.   Electronically Signed   By: Myles Rosenthal M.D.   On: 12/22/2012 20:17   Ir Fluoro Guide Cv Midline Picc Right  12/29/2012   CLINICAL DATA:  Encephalitis and need for venous access. A previously placed PICC line has been completely removed by the patient.  EXAM: RIGHT UPPER EXTREMITY PICC LINE PLACEMENT WITH ULTRASOUND AND FLUOROSCOPIC GUIDANCE  FLUOROSCOPY TIME:  12 seconds.  PROCEDURE: The patient's son was advised of the possible risks and complications and agreed to undergo the procedure. The patient was then brought to the angiographic suite for the procedure.  The right arm was prepped with chlorhexidine, draped in the usual sterile fashion using maximum barrier technique (cap and mask, sterile gown, sterile gloves, large sterile sheet, hand hygiene and cutaneous antisepsis) and infiltrated locally with 1% Lidocaine.  Ultrasound demonstrated patency of the right basilic vein, and this was documented with an image. Under real-time ultrasound guidance, this vein was accessed with a 21 gauge micropuncture needle and image documentation was performed. A 0.018 wire was introduced in to the vein. Over this, a 5 [place] dual lumen power injectable PICC cut to 41 cm length was advanced to the lower SVC/right atrial junction. Fluoroscopy during the procedure and fluoro spot radiograph confirms appropriate catheter position. The catheter was flushed and covered with a sterile dressing.  Complications: None  IMPRESSION: Successful right arm power injectable PICC line placement with  ultrasound and fluoroscopic guidance. The catheter is ready for use.   Electronically Signed   By: Irish Lack M.D.   On: 12/29/2012 14:05   Dg Fluoro Guide Lumbar Puncture  12/25/2012   CLINICAL DATA:  Fever. Altered mental status.  EXAM: DIAGNOSTIC  LUMBAR PUNCTURE UNDER FLUOROSCOPIC GUIDANCE  FLUOROSCOPY TIME:  45 seconds.  PROCEDURE: Informed consent was obtained from the patient prior to the procedure, including potential complications of headache, allergy, and pain. With the patient prone, the lower back was prepped with Betadine. 1% Lidocaine was used for local anesthesia. Lumbar puncture was performed at the L3-4 level using a gauge needle with return of clear CSF with an opening pressure of 21 cm water. 11.86ml of CSF were obtained for laboratory studies. The patient tolerated the procedure well and there were no apparent complications.  IMPRESSION: Successful lumbar puncture as above.  Electronically Signed   By: Drusilla Kanner M.D.   On: 12/25/2012 11:31   EEG   Clinical Interpretation: This EEG is consistent with a mild nonspecific cerebral dysfunction. Compared to the previous recording no sharp waves were recorded. Of note, Keppra can have a suppressing effect on sharp waves. There was no seizure or seizure predisposition recorded on this study.     Assessment/Plan  Seizure Remains seizure free. Continue keppra 250 mg bid for now  Generalized weakness Continue PT and OT. Continue speech therapy. Aspiration precautions. Fall precautions. Skin care  Dementia Continue his exelon patch and namenda, fall precautions, skin care, aspiration precautions. Will also continue his provigil for helping with his day time somnolence, this has been helpful  Anemia Monitor cbc, no bleed reported, no active signs of bleed  GERD Continue prilosec, monitor status  bph Continue finasteride and flomax, monitor symptoms, no signs of retention at present  Vitamin d def Continue vitamin d supplement  Depression from dementia Continue zoloft, calm in the facility, baseline confusion present  Hyperlipidemia Continue zocor and monitor clinically  Family/ staff Communication: reviewed plan with nursing supervisor   Labs/tests ordered- cbc,  cmp

## 2013-02-10 ENCOUNTER — Encounter: Payer: Self-pay | Admitting: Neurology

## 2013-02-10 ENCOUNTER — Ambulatory Visit (INDEPENDENT_AMBULATORY_CARE_PROVIDER_SITE_OTHER): Payer: Medicare Other | Admitting: Neurology

## 2013-02-10 VITALS — BP 140/68 | HR 60 | Ht 70.5 in | Wt 186.0 lb

## 2013-02-10 DIAGNOSIS — F0280 Dementia in other diseases classified elsewhere without behavioral disturbance: Secondary | ICD-10-CM

## 2013-02-10 DIAGNOSIS — R569 Unspecified convulsions: Secondary | ICD-10-CM

## 2013-02-10 DIAGNOSIS — R400 Somnolence: Secondary | ICD-10-CM

## 2013-02-10 DIAGNOSIS — G4710 Hypersomnia, unspecified: Secondary | ICD-10-CM

## 2013-02-10 LAB — VITAMIN B12: Vitamin B-12: 298 pg/mL (ref 211–911)
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
## 2013-02-10 NOTE — Patient Instructions (Signed)
1.  Check blood work today  2.  Referral for sleep study at [facility] 3.  Continue Keppra 250mg  BID 4.  Continue Namenda 10mg  BID and Exelon 9.5mg  5.  Continue Provigil 50mg  daily 6.  Return to clinic in 6 weeks  SEIZURE PRECAUTIONS  Bathroom Safety  A person with seizures may want to shower instead of bathe to avoid accidental drowning. If falls occur during the patient's typical seizure, a person should use a shower seat, preferably one with a safety strap.    Use nonskid strips in your shower or tub.    Never use electrical equipment near water. This prevents accidental electrocution.    Consider changing glass in shower doors to shatterproof glass.   Secondary school teacher   If possible, cook when someone else is nearby.    Use the back burners of the stove to prevent accidental burns.    Use shatterproof containers as much as possible. For instance, sauces can be transferred from glass bottles to plastic containers for use.    Limit time that is required using knives or other sharp objects. If possible, buy foods that are already cut, or ask someone to help in meal preparation.   General Safety at Home   Do not smoke or light fires in the fireplace unless someone else is present.    Do not use space heaters that can be accidentally overturned.    When alone, avoid using step stools or ladders, and do not clean rooftop gutters.    Purchase power tools and motorized Risk manager which have a safety switch that will stop the machine if you release the handle (a 'dead man's' switch).   Driving and Transportation   Avoid driving unless your seizures are well controlled and/or you have permission to drive from your state's Department of Motor Vehicles  [facility]). Each state has different laws. Please refer to the following link on the Epilepsy Foundation of America's website for more information: http://www.epilepsyfoundation.org/answerplace/Social/driving/drivingu.cfm    If you ride a bicycle, wear a  helmet and any other necessary protective gear.    When taking public transportation like the bus or subway, stay clear of the platform edge.   Outdoor Product/process development scientist is okay, but does present certain risks. Never swim alone, and tell friends what to do if you have a seizure while swimming.    Wear appropriate protective equipment.    Ski with a friend. If a seizure occurs, your friend can seek help, if needed. He or she can also help to get you out of the cold. Consider using a safety hook or belt while riding the ski lift.

## 2013-02-12 LAB — METHYLMALONIC ACID, SERUM: Methylmalonic Acid, Quant: 0.24 umol/L (ref <0.40)

## 2013-02-13 ENCOUNTER — Other Ambulatory Visit: Payer: Self-pay | Admitting: *Deleted

## 2013-02-13 ENCOUNTER — Telehealth: Payer: Self-pay | Admitting: Neurology

## 2013-02-13 DIAGNOSIS — R400 Somnolence: Secondary | ICD-10-CM

## 2013-02-13 LAB — VITAMIN E
Gamma-Tocopherol (Vit E): 1.3 mg/L (ref <=4.3)
Vitamin E (Alpha Tocopherol): 13.1 mg/L (ref 5.7–19.9)

## 2013-02-13 NOTE — Telephone Encounter (Signed)
I called and left a messge for the patient's son to callback to the office to schedule his father' sleep consult with Dr. Frances Furbish.

## 2013-03-19 NOTE — Progress Notes (Signed)
Date of Visit [address]/07/[address] Rm # [id number]  Patient ID: Charles SaucierFrank X Lambert, male   DOB: Jan 04, 1928, 80 y.o.   MRN: [id number]  No Known Allergies  Chief Complaint  Patient presents with  . Acute Visit   HPI  Alerted this a.m., regarding a severe and significant decline in patient's status. Reports that patient is essentially nonverbal and unable to follow simple commands. Typically the patient is very conversant and interactive. Pt had been hospitalized for sepsis and had been IV antibiotic therapy via a PICC line.   Remotely, pt does have history of previous seizure  Review of Systems  Neurological: Positive for focal weakness and weakness.       Not spontaneously moving his lower extremities, and decreased responsiveness to questions. Not typically interactive. Positive response to sternal rub. Patient can answer yes or no and intermittently open his eyes if asked.    Past Medical History  Diagnosis Date  . Dementia   . Subdural hematoma   . Prostate enlargement   . Pacemaker   . Sinoatrial node dysfunction    Past Surgical History  Procedure Laterality Date  . Burr hole    . Knee arthroscopy Left    Medications Vitamin D3 1000 units by mouth each day Proscar 5 mg one tablet at bedtime Namenda 10 mg per day Provigil 50 mg per day  Omeprazole 20 mg per day Exelon 9.5 mg per 24 hours, apply a new patch each day after removing the old patch Florastor 250 mg one twice a day Zoloft 25 mg per day Zocor 40 mg each evening Flomax 0.4 each evening Tramadol, 50 mg, one half tablet every 6 hours as needed for pain Vitamin B12 1000 mcg per day  Labs, from 12/31/2012  WBC 6.2 RBC 3.7 Hemoglobin 11 Hematocrit 34.4 MCV 94.2 MCH 30.1 MCHC 32  RDW 13 Platelets 285  Sodium 139 Potassium 3.5 Chloride 106 CO2 26 AGAP 7 Glucose 90 BUN 11 Creatinine 1.2 Calcium 8.5  Total protein 5.3 Albumin 3.1 GLOB 2.2 Bilirubin 0.6 Direct bilirubin 0.2 ALP 41 AST  31 ALT 18  Labs from 01/01/2013 Sodium 140 Potassium 3.6 Chloride 107 CO2 26 AGAP 7 Glucose 80 BUN 14 Creatinine 1.2 Calcium 8.4  Vancomycin trough Elevated at 15.71, with a reference range of 5-10  Vital Signs:   BP 180/92. RR 20, Pulse 60, Pulse Ox 93%  Physical examination:  Upon examination of the patient noted that he was almost nonresponsive. Able to with intermittently open his eyes, noted to keep his right arm claims to his chest otherwise independent movement of his left arm. No independent movement noted of his lower extremities. Positive response to sternal rub. Unable to answer simple questions. Significant change from the patient's baseline status as he is normally very conversant.  Head appears normocephalic and features symmetrical.  Unable to examine the eyes as related to the patient's resistance to examination.  Unable to examine ears oral pharynx again, related to patient's resistance to examination.  No palpable cervical adenopathy or thyromegaly.  Apical pulses with a regular rate and rhythm  Bilateral breath sounds are clear  Abdomen, positive bowel sounds, soft nondistended, nontender to palpation.  Bilateral lower extremities, pedal pulses palpable bilaterally. Right lower extremity with approximately 2+ pitting edema in the medial ankle but in being below the knee. Left lower extremity with approximately 1-2+ pitting edema predominantly confined at the ankle area.  Assessment/plan  Dramatic and significant change from patient's baseline, as he is  normally very conversant and interactive, able to participate equal operate with examination.  Have called the patient's son, unable to speak directly with him, have left appropriate callback numbers and plan regarding his father.  Have called EMS, and they have respond within 10 minutes. Patient is to be transported to [facility] for evaluation and management. Would anticipate the patient  returning to [facility] place after hospital discharge.

## 2013-03-24 ENCOUNTER — Ambulatory Visit (INDEPENDENT_AMBULATORY_CARE_PROVIDER_SITE_OTHER): Payer: Medicare Other | Admitting: Neurology

## 2013-03-24 ENCOUNTER — Encounter: Payer: Self-pay | Admitting: Neurology

## 2013-03-24 VITALS — BP 106/62 | HR 64 | Resp 14 | Ht 71.0 in | Wt 185.4 lb

## 2013-03-24 DIAGNOSIS — F0280 Dementia in other diseases classified elsewhere without behavioral disturbance: Secondary | ICD-10-CM | POA: Insufficient documentation

## 2013-03-24 DIAGNOSIS — G3183 Dementia with Lewy bodies: Principal | ICD-10-CM

## 2013-03-24 MED ORDER — CARBIDOPA-LEVODOPA 25-100 MG PO TABS
1.0000 | ORAL_TABLET | Freq: Three times a day (TID) | ORAL | Status: DC
Start: 2013-03-24 — End: 2014-01-13

## 2013-03-24 NOTE — Patient Instructions (Addendum)
1. Start taking vitamin B12 daily by mouth 2. Start taking sinemet 25/100mg  daily x 1 week, then twice daily x 1 week, then three times daily.  Take one hour prior to meals.  If he develops nausea, okay to take with crackers. 3. Literature on Lewy body dementia and falls precautions provided 4. Return to clinic 6 weeks
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
## 2013-04-16 ENCOUNTER — Emergency Department (HOSPITAL_COMMUNITY): Payer: Medicare Other

## 2013-04-16 ENCOUNTER — Emergency Department (HOSPITAL_COMMUNITY)
Admission: EM | Admit: 2013-04-16 | Discharge: 2013-04-16 | Disposition: A | Discharge status: Home / Self Care | Payer: Medicare Other | Attending: Emergency Medicine | Admitting: Emergency Medicine

## 2013-04-16 ENCOUNTER — Encounter (HOSPITAL_COMMUNITY): Payer: Self-pay | Admitting: Emergency Medicine

## 2013-04-16 DIAGNOSIS — Z950 Presence of cardiac pacemaker: Secondary | ICD-10-CM | POA: Insufficient documentation

## 2013-04-16 DIAGNOSIS — S79919A Unspecified injury of unspecified hip, initial encounter: Secondary | ICD-10-CM | POA: Insufficient documentation

## 2013-04-16 DIAGNOSIS — R296 Repeated falls: Secondary | ICD-10-CM | POA: Insufficient documentation

## 2013-04-16 DIAGNOSIS — Z79899 Other long term (current) drug therapy: Secondary | ICD-10-CM | POA: Insufficient documentation

## 2013-04-16 DIAGNOSIS — F0390 Unspecified dementia without behavioral disturbance: Secondary | ICD-10-CM | POA: Insufficient documentation

## 2013-04-16 DIAGNOSIS — S0993XA Unspecified injury of face, initial encounter: Secondary | ICD-10-CM | POA: Insufficient documentation

## 2013-04-16 DIAGNOSIS — S79929A Unspecified injury of unspecified thigh, initial encounter: Principal | ICD-10-CM

## 2013-04-16 DIAGNOSIS — Z8679 Personal history of other diseases of the circulatory system: Secondary | ICD-10-CM | POA: Insufficient documentation

## 2013-04-16 DIAGNOSIS — F29 Unspecified psychosis not due to a substance or known physiological condition: Secondary | ICD-10-CM | POA: Insufficient documentation

## 2013-04-16 DIAGNOSIS — S199XXA Unspecified injury of neck, initial encounter: Secondary | ICD-10-CM

## 2013-04-16 DIAGNOSIS — D649 Anemia, unspecified: Secondary | ICD-10-CM | POA: Insufficient documentation

## 2013-04-16 DIAGNOSIS — Y9210 Unspecified residential institution as the place of occurrence of the external cause: Secondary | ICD-10-CM | POA: Insufficient documentation

## 2013-04-16 DIAGNOSIS — Z8701 Personal history of pneumonia (recurrent): Secondary | ICD-10-CM | POA: Insufficient documentation

## 2013-04-16 DIAGNOSIS — W19XXXA Unspecified fall, initial encounter: Secondary | ICD-10-CM

## 2013-04-16 DIAGNOSIS — N400 Enlarged prostate without lower urinary tract symptoms: Secondary | ICD-10-CM | POA: Insufficient documentation

## 2013-04-16 DIAGNOSIS — Y939 Activity, unspecified: Secondary | ICD-10-CM | POA: Insufficient documentation

## 2013-04-16 DIAGNOSIS — Z87891 Personal history of nicotine dependence: Secondary | ICD-10-CM | POA: Insufficient documentation

## 2013-04-16 LAB — OCCULT BLOOD, POC DEVICE: Fecal Occult Bld: NEGATIVE

## 2013-04-16 LAB — URINALYSIS, ROUTINE W REFLEX MICROSCOPIC
Bilirubin Urine: NEGATIVE
Glucose, UA: NEGATIVE mg/dL
Hgb urine dipstick: NEGATIVE
Ketones, ur: NEGATIVE mg/dL
LEUKOCYTES UA: NEGATIVE
NITRITE: NEGATIVE
Protein, ur: NEGATIVE mg/dL
SPECIFIC GRAVITY, URINE: 1.022 (ref 1.005–1.030)
Urobilinogen, UA: 0.2 mg/dL (ref 0.0–1.0)
pH: 6 (ref 5.0–8.0)

## 2013-04-16 LAB — CBC WITH DIFFERENTIAL/PLATELET
BASOS ABS: 0 10*3/uL (ref 0.0–0.1)
Basophils Relative: 0 % (ref 0–1)
EOS PCT: 2 % (ref 0–5)
Eosinophils Absolute: 0.1 10*3/uL (ref 0.0–0.7)
HEMATOCRIT: 33.4 % — AB (ref 39.0–52.0)
Hemoglobin: 10.6 g/dL — ABNORMAL LOW (ref 13.0–17.0)
LYMPHS PCT: 13 % (ref 12–46)
Lymphs Abs: 0.8 10*3/uL (ref 0.7–4.0)
MCH: 29.3 pg (ref 26.0–34.0)
MCHC: 31.7 g/dL (ref 30.0–36.0)
MCV: 92.3 fL (ref 78.0–100.0)
MONO ABS: 0.5 10*3/uL (ref 0.1–1.0)
MONOS PCT: 7 % (ref 3–12)
Neutro Abs: 4.9 10*3/uL (ref 1.7–7.7)
Neutrophils Relative %: 78 % — ABNORMAL HIGH (ref 43–77)
Platelets: 202 10*3/uL (ref 150–400)
RBC: 3.62 MIL/uL — ABNORMAL LOW (ref 4.22–5.81)
RDW: 16.1 % — ABNORMAL HIGH (ref 11.5–15.5)
WBC: 6.4 10*3/uL (ref 4.0–10.5)

## 2013-04-16 LAB — COMPREHENSIVE METABOLIC PANEL
ALT: <5 U/L (ref 0–53)
AST: 21 U/L (ref 0–37)
Albumin: 3.3 g/dL — ABNORMAL LOW (ref 3.5–5.2)
Alkaline Phosphatase: 77 U/L (ref 39–117)
BILIRUBIN TOTAL: 0.5 mg/dL (ref 0.3–1.2)
BUN: 19 mg/dL (ref 6–23)
CALCIUM: 8.8 mg/dL (ref 8.4–10.5)
CHLORIDE: 103 meq/L (ref 96–112)
CO2: 27 meq/L (ref 19–32)
CREATININE: 1.15 mg/dL (ref 0.50–1.35)
GFR calc non Af Amer: 57 mL/min — ABNORMAL LOW (ref >90)
GFR, EST AFRICAN AMERICAN: 66 mL/min — AB (ref >90)
GLUCOSE: 127 mg/dL — AB (ref 70–99)
Potassium: 4.6 mEq/L (ref 3.7–5.3)
Sodium: 139 mEq/L (ref 137–147)
Total Protein: 6.7 g/dL (ref 6.0–8.3)

## 2013-04-16 LAB — POCT I-STAT TROPONIN I: TROPONIN I, POC: 0.02 ng/mL (ref 0.00–0.08)

## 2013-04-16 NOTE — ED Provider Notes (Signed)
  Face-to-face evaluation   History: According to the nurse that was with him at his care facility, he was walking, with a shuffling gait when he fell.  He injured his right hip. She checked his vital signs, and they were normal. He has been well recently, without illnesses, and eating well.  Physical exam: Elderly male, alert attempts to talk, but is unable to give history. Tenderness and swelling right knee with mild pain on passive range of motion. Head, with mild, right-sided abrasion. No palpable skull defect or crepitation.  9:58 PM Reevaluation with update and discussion. After initial assessment and treatment, an updated evaluation reveals PE unchanged, vitals normal. Keishana Klinger L   Medical screening examination/treatment/procedure(s) were conducted as a shared visit with non-physician practitioner(s) and myself.  I personally evaluated the patient during the encounter  Flint MelterElliott L Altamese Deguire, MD 04/18/13 [phone]

## 2013-04-16 NOTE — Discharge Instructions (Signed)
Imaging performed today was negative for fracture or serious injury  You have a history of anemia which appears to be worsening - follow-up with your doctor regarding this  Return to the emergency department if you develop any changing/worsening condition, weakness, slurred speech, loss of sensation, repeated vomiting, blood in stool/vomit/urine, or any other concerns (please read additional information regarding your condition below)      Fall Prevention and Home Safety Falls cause injuries and can affect all age groups. It is possible to use preventive measures to significantly decrease the likelihood of falls. There are many simple measures which can make your home safer and prevent falls. OUTDOORS  Repair cracks and edges of walkways and driveways.  Remove high doorway thresholds.  Trim shrubbery on the main path into your home.  Have good outside lighting.  Clear walkways of tools, rocks, debris, and clutter.  Check that handrails are not broken and are securely fastened. Both sides of steps should have handrails.  Have leaves, snow, and ice cleared regularly.  Use sand or salt on walkways during winter months.  In the garage, clean up grease or oil spills. BATHROOM  Install night lights.  Install grab bars by the toilet and in the tub and shower.  Use non-skid mats or decals in the tub or shower.  Place a plastic non-slip stool in the shower to sit on, if needed.  Keep floors dry and clean up all water on the floor immediately.  Remove soap buildup in the tub or shower on a regular basis.  Secure bath mats with non-slip, double-sided rug tape.  Remove throw rugs and tripping hazards from the floors. BEDROOMS  Install night lights.  Make sure a bedside light is easy to reach.  Do not use oversized bedding.  Keep a telephone by your bedside.  Have a firm chair with side arms to use for getting dressed.  Remove throw rugs and tripping hazards from the  floor. KITCHEN  Keep handles on pots and pans turned toward the center of the stove. Use back burners when possible.  Clean up spills quickly and allow time for drying.  Avoid walking on wet floors.  Avoid hot utensils and knives.  Position shelves so they are not too high or low.  Place commonly used objects within easy reach.  If necessary, use a sturdy step stool with a grab bar when reaching.  Keep electrical cables out of the way.  Do not use floor polish or wax that makes floors slippery. If you must use wax, use non-skid floor wax.  Remove throw rugs and tripping hazards from the floor. STAIRWAYS  Never leave objects on stairs.  Place handrails on both sides of stairways and use them. Fix any loose handrails. Make sure handrails on both sides of the stairways are as long as the stairs.  Check carpeting to make sure it is firmly attached along stairs. Make repairs to worn or loose carpet promptly.  Avoid placing throw rugs at the top or bottom of stairways, or properly secure the rug with carpet tape to prevent slippage. Get rid of throw rugs, if possible.  Have an electrician put in a light switch at the top and bottom of the stairs. OTHER FALL PREVENTION TIPS  Wear low-heel or rubber-soled shoes that are supportive and fit well. Wear closed toe shoes.  When using a stepladder, make sure it is fully opened and both spreaders are firmly locked. Do not climb a closed stepladder.  Add color  or contrast paint or tape to grab bars and handrails in your home. Place contrasting color strips on first and last steps.  Learn and use mobility aids as needed. Install an electrical emergency response system.  Turn on lights to avoid dark areas. Replace light bulbs that burn out immediately. Get light switches that glow.  Arrange furniture to create clear pathways. Keep furniture in the same place.  Firmly attach carpet with non-skid or double-sided tape.  Eliminate uneven  floor surfaces.  Select a carpet pattern that does not visually hide the edge of steps.  Be aware of all pets. OTHER HOME SAFETY TIPS  Set the water temperature for 120 F (48.8 C).  Keep emergency numbers on or near the telephone.  Keep smoke detectors on every level of the home and near sleeping areas. Document Released: 02/16/2002 Document Revised: 08/28/2011 Document Reviewed: 05/18/2011 [facility] Patient Information 2014 [place], [place].   Anemia, Nonspecific Anemia is a condition in which the concentration of red blood cells or hemoglobin in the blood is below normal. Hemoglobin is a substance in red blood cells that carries oxygen to the tissues of the body. Anemia results in not enough oxygen reaching these tissues.  CAUSES  Common causes of anemia include:   Excessive bleeding. Bleeding may be internal or external. This includes excessive bleeding from periods (in women) or from the intestine.   Poor nutrition.   Chronic kidney, thyroid, and liver disease.  Bone marrow disorders that decrease red blood cell production.  Cancer and treatments for cancer.  HIV, AIDS, and their treatments.  Spleen problems that increase red blood cell destruction.  Blood disorders.  Excess destruction of red blood cells due to infection, medicines, and autoimmune disorders. SIGNS AND SYMPTOMS   Minor weakness.   Dizziness.   Headache.  Palpitations.   Shortness of breath, especially with exercise.   Paleness.  Cold sensitivity.  Indigestion.  Nausea.  Difficulty sleeping.  Difficulty concentrating. Symptoms may occur suddenly or they may develop slowly.  DIAGNOSIS  Additional blood tests are often needed. These help your health care provider determine the best treatment. Your health care provider will check your stool for blood and look for other causes of blood loss.  TREATMENT  Treatment varies depending on the cause of the anemia. Treatment can include:    Supplements of iron, vitamin B12, or folic acid.   Hormone medicines.   A blood transfusion. This may be needed if blood loss is severe.   Hospitalization. This may be needed if there is significant continual blood loss.   Dietary changes.  Spleen removal. HOME CARE INSTRUCTIONS Keep all follow-up appointments. It often takes many weeks to correct anemia, and having your health care provider check on your condition and your response to treatment is very important. SEEK IMMEDIATE MEDICAL CARE IF:   You develop extreme weakness, shortness of breath, or chest pain.   You become dizzy or have trouble concentrating.  You develop heavy vaginal bleeding.   You develop a rash.   You have bloody or black, tarry stools.   You faint.   You vomit up blood.   You vomit repeatedly.   You have abdominal pain.  You have a fever or persistent symptoms for more than 2 3 days.   You have a fever and your symptoms suddenly get worse.   You are dehydrated.  MAKE SURE YOU:  Understand these instructions.  Will watch your condition.  Will get help right away if you are  not doing well or get worse. Document Released: 04/05/2004 Document Revised: 10/29/2012 Document Reviewed: 08/22/2012 [facility] Patient Information 2014 [place], [place].  Head Injury, Adult You have received a head injury. It does not appear serious at this time. Headaches and vomiting are common following head injury. It should be easy to awaken from sleeping. Sometimes it is necessary for you to stay in the emergency department for a while for observation. Sometimes admission to the hospital may be needed. After injuries such as yours, most problems occur within the first 24 hours, but side effects may occur up to 7 10 days after the injury. It is important for you to carefully monitor your condition and contact your health care provider or seek immediate medical care if there is a change in your  condition. WHAT ARE THE TYPES OF HEAD INJURIES? Head injuries can be as minor as a bump. Some head injuries can be more severe. More severe head injuries include:  A jarring injury to the brain (concussion).  A bruise of the brain (contusion). This mean there is bleeding in the brain that can cause swelling.  A cracked skull (skull fracture).  Bleeding in the brain that collects, clots, and forms a bump (hematoma). WHAT CAUSES A HEAD INJURY? A serious head injury is most likely to happen to someone who is in a car wreck and is not wearing a seat belt. Other causes of major head injuries include bicycle or motorcycle accidents, sports injuries, and falls. HOW ARE HEAD INJURIES DIAGNOSED? A complete history of the event leading to the injury and your current symptoms will be helpful in diagnosing head injuries. Many times, pictures of the brain, such as CT or MRI are needed to see the extent of the injury. Often, an overnight hospital stay is necessary for observation.  WHEN SHOULD I SEEK IMMEDIATE MEDICAL CARE?  You should get help right away if:  You have confusion or drowsiness.  You feel sick to your stomach (nauseous) or have continued, forceful vomiting.  You have dizziness or unsteadiness that is getting worse.  You have severe, continued headaches not relieved by medicine. Only take over-the-counter or prescription medicines for pain, fever, or discomfort as directed by your health care provider.  You do not have normal function of the arms or legs or are unable to walk.  You notice changes in the black spots in the center of the colored part of your eye (pupil).  You have a clear or bloody fluid coming from your nose or ears.  You have a loss of vision. During the next 24 hours after the injury, you must stay with someone who can watch you for the warning signs. This person should contact local emergency services (911 in the [place] if you have seizures, you become unconscious,  or you are unable to wake up. HOW CAN I PREVENT A HEAD INJURY IN THE FUTURE? The most important factor for preventing major head injuries is avoiding motor vehicle accidents. To minimize the potential for damage to your head, it is crucial to wear seat belts while riding in motor vehicles. Wearing helmets while bike riding and playing collision sports (like football) is also helpful. Also, avoiding dangerous activities around the house will further help reduce your risk of head injury.  WHEN CAN I RETURN TO NORMAL ACTIVITIES AND ATHLETICS? You should be reevaluated by your health care provider before returning to these activities. If you have any of the following symptoms, you should not return to activities or  contact sports until 1 week after the symptoms have stopped:  Persistent headache.  Dizziness or vertigo.  Poor attention and concentration.  Confusion.  Memory problems.  Nausea or vomiting.  Fatigue or tire easily.  Irritability.  Intolerant of bright lights or loud noises.  Anxiety or depression.  Disturbed sleep. MAKE SURE YOU:   Understand these instructions.  Will watch your condition.  Will get help right away if you are not doing well or get worse. Document Released: 02/26/2005 Document Revised: 12/17/2012 Document Reviewed: 11/03/2012 [facility] Patient Information 2014 [place], [place].

## 2013-04-16 NOTE — ED Notes (Addendum)
Initial contact - pt resting on stretcher with eyes closed, arousable to verbal stim.  Pt denies pain or complaints, however grimaces with palp of L hip and points to L hip when asked about pain.  Per notes, pt with hx dementia and pt at baseline at this time.  Pt awake, alert.  Pt follows simple directions and answers simple questions appropriately.  Speaking full sentences, rr even/un-lab, lsctab.  Pt denies cp/sob.  MAEI, +csm/+pulses, pt denies n/t to extremities, no shortening/rotation noted to LLE.  No swelling, bruising or obvious deformities noted.  Skin PWD.  Minor abrasions noted to face, no bleeding.  Pt changed to hospital gown, placed to cardiac/02 monitor, paced rhythm.  NAD.  Awaiting EDP eval.

## 2013-04-16 NOTE — ED Notes (Signed)
Pt from [facility] with c/o fall, denies LOC.  C/o "twinge" pain to L hip with movement.  Pt with hx dementia, at baseline per staff at facility.

## 2013-04-16 NOTE — ED Notes (Signed)
Bed: [id number] Expected date:  Expected time:  Means of arrival:  Comments: EMS- Elderly, fall, hip pain

## 2013-04-16 NOTE — ED Notes (Signed)
Chaperoned rectal exam by EDP, hemoccult card collected.

## 2013-04-16 NOTE — ED Provider Notes (Signed)
CSN: [id number]     Arrival date & time 04/16/13  1850 History   First MD Initiated Contact with Patient 04/16/13 1856     Chief Complaint  Patient presents with  . Fall    HPI  Charles Lambert is a 80 y.o. male with a PMH of dementia, subdural hematoma, prostate enlargement, and sinoatrial node dysfunction s/p pacemaker who presents to the ED for evaluation of fall.  History was provided by the patient.  Patient is a poor historian.  Patient alert and answers all questions.  Not oriented to place or time but oriented to self.  Patient at baseline per EMS report from nursing home. Patient brought in for a fall which occurred at [facility].  Fall allegedly mechanical in nature. When asked if he has any pain, patient points to his right shoulder.  EMS note reports left hip pain, however, patient denies this.  Patient cannot recall any events of the fall.  When asked if he has any neck pain patient nods yes but cannot elaborate.  No chest pain, abdominal pain, or headache.  Medication list reviewed which shows no anti-coagulation.     Past Medical History  Diagnosis Date  . Dementia   . Subdural hematoma   . Prostate enlargement   . Pacemaker   . Sinoatrial node dysfunction   . Pneumonia    Past Surgical History  Procedure Laterality Date  . Burr hole    . Knee arthroscopy Left    Family History  Problem Relation Age of Onset  . Hypertension Mother   . Hypertension Father   . Hyperlipidemia Son    History  Substance Use Topics  . Smoking status: Former Games developer  . Smokeless tobacco: Not on file     Comment: Quit in 1970s  . Alcohol Use: Yes     Comment: Occasional beer     Review of Systems  Constitutional: Negative for fever, activity change, appetite change and fatigue.  Respiratory: Negative for shortness of breath.   Cardiovascular: Negative for chest pain and leg swelling.  Gastrointestinal: Negative for nausea, vomiting and abdominal pain.   Genitourinary: Negative for dysuria.  Musculoskeletal: Positive for neck pain. Negative for back pain.  Skin: Negative for wound.  Neurological: Negative for weakness and headaches.  Psychiatric/Behavioral: Positive for confusion.    Allergies  Review of patient's allergies indicates no known allergies.  Home Medications   Current Outpatient Rx  Name  Route  Sig  Dispense  Refill  . carbidopa-levodopa (SINEMET IR) 25-100 MG per tablet   Oral   Take 1 tablet by mouth 3 (three) times daily.   60 tablet   2   . cholecalciferol (VITAMIN D) 1000 UNITS tablet   Oral   Take 1,000 Units by mouth daily.         . finasteride (PROSCAR) 5 MG tablet   Oral   Take 5 mg by mouth at bedtime.          . lansoprazole (PREVACID) 30 MG capsule   Oral   Take 30 mg by mouth daily at 12 noon.         . levETIRAcetam (KEPPRA) 250 MG tablet   Oral   Take 250 mg by mouth 2 (two) times daily.          . memantine (NAMENDA) 10 MG tablet   Oral   Take 10 mg by mouth 2 (two) times daily.         Marland Kitchen  modafinil (PROVIGIL) 100 MG tablet   Oral   Take 0.5 tablets (50 mg total) by mouth daily.         . rivastigmine (EXELON) 9.5 mg/24hr   Transdermal   Place 1 patch onto the skin daily.         Marland Kitchen. saccharomyces boulardii (FLORASTOR) 250 MG capsule   Oral   Take 250 mg by mouth 2 (two) times daily.         . sertraline (ZOLOFT) 50 MG tablet   Oral   Take 50 mg by mouth daily.         . simvastatin (ZOCOR) 40 MG tablet   Oral   Take 40 mg by mouth every evening.         . Tamsulosin HCl (FLOMAX) 0.4 MG CAPS   Oral   Take 0.4 mg by mouth daily after supper.          . traMADol (ULTRAM) 50 MG tablet   Oral   Take 25 mg by mouth every 6 (six) hours as needed for pain. Take 1/2 tab every 6 hours as needed for pain         . vitamin B-12 (CYANOCOBALAMIN) 1000 MCG tablet   Oral   Take 1,000 mcg by mouth daily.          BP 157/71  Pulse 67  Temp(Src) 97.9 F  (36.6 C) (Oral)  Resp 18  SpO2 97%  Filed Vitals:   04/16/13 1850 04/16/13 1904 04/16/13 2156  BP:  157/71 147/79  Pulse:  67 60  Temp:  97.9 F (36.6 C)   TempSrc:  Oral   Resp:  18 20  SpO2: 99% 97% 97%    Physical Exam  Nursing note and vitals reviewed. Constitutional: He is oriented to person, place, and time. He appears well-developed and well-nourished. No distress.  HENT:  Head: Normocephalic and atraumatic.  Right Ear: External ear normal.  Left Ear: External ear normal.  Nose: Nose normal.  Mouth/Throat: Oropharynx is clear and moist. No oropharyngeal exudate.  No tenderness to the scalp or face throughout. No palpable hematoma, step-offs, or lacerations throughout. Unable to visualize TM's due to cerumen bilaterally.  Eyes: Conjunctivae and EOM are normal. Pupils are equal, round, and reactive to light. Right eye exhibits no discharge. Left eye exhibits no discharge.  Neck: Normal range of motion. Neck supple.  No cervical spinal or paraspinal tenderness to palpation throughout.  No limitations with neck ROM.    Cardiovascular: Normal rate, regular rhythm, normal heart sounds and intact distal pulses.  Exam reveals no gallop and no friction rub.   No murmur heard. Dorsalis pedis pulses present and equal bilaterally  Pulmonary/Chest: Effort normal and breath sounds normal. No respiratory distress. He has no wheezes. He has no rales. He exhibits no tenderness.  Abdominal: Soft. Bowel sounds are normal. He exhibits no distension and no mass. There is no tenderness. There is no rebound and no guarding.  Musculoskeletal: Normal range of motion. He exhibits no edema and no tenderness.  Strength 5/5 in the upper and lower extremities bilaterally. No tenderness to the UE or LE throughout.  No increased pain with palpation or ROM of the hips bilaterally.     Neurological: He is alert and oriented to person, place, and time.  GCS 15.  No focal neurological deficits.  CN 2-12  intact.  No pronator drift.  Patient not oriented to place or time but is oriented to person.  Patient  answering all questions and is alert.   Skin: Skin is warm and dry. He is not diaphoretic.  No wounds, lacerations, ecchymosis, edema, or erythema throughout    ED Course  Procedures (including critical care time) Labs Review Labs Reviewed - No data to display Imaging Review No results found.  EKG Interpretation    Date/Time:  Thursday April 16 2013 20:05:18 EST Ventricular Rate:  60 PR Interval:  204 QRS Duration: 88 QT Interval:  444 QTC Calculation: 444 R Axis:   89 Text Interpretation:  Atrial-paced rhythm Borderline right axis deviation Since last tracing pacing now present Confirmed by WENTZ  MD, ELLIOTT ([id number]) on 04/16/2013 8:10:29 PM           Results for orders placed during the hospital encounter of 04/16/13  CBC WITH DIFFERENTIAL      Result Value Range   WBC 6.4  4.0 - 10.5 K/uL   RBC 3.62 (*) 4.22 - 5.81 MIL/uL   Hemoglobin 10.6 (*) 13.0 - 17.0 g/dL   HCT 16.1 (*) 09.6 - 04.5 %   MCV 92.3  78.0 - 100.0 fL   MCH 29.3  26.0 - 34.0 pg   MCHC 31.7  30.0 - 36.0 g/dL   RDW 40.9 (*) 81.1 - 91.4 %   Platelets 202  150 - 400 K/uL   Neutrophils Relative % 78 (*) 43 - 77 %   Neutro Abs 4.9  1.7 - 7.7 K/uL   Lymphocytes Relative 13  12 - 46 %   Lymphs Abs 0.8  0.7 - 4.0 K/uL   Monocytes Relative 7  3 - 12 %   Monocytes Absolute 0.5  0.1 - 1.0 K/uL   Eosinophils Relative 2  0 - 5 %   Eosinophils Absolute 0.1  0.0 - 0.7 K/uL   Basophils Relative 0  0 - 1 %   Basophils Absolute 0.0  0.0 - 0.1 K/uL  COMPREHENSIVE METABOLIC PANEL      Result Value Range   Sodium 139  137 - 147 mEq/L   Potassium 4.6  3.7 - 5.3 mEq/L   Chloride 103  96 - 112 mEq/L   CO2 27  19 - 32 mEq/L   Glucose, Bld 127 (*) 70 - 99 mg/dL   BUN 19  6 - 23 mg/dL   Creatinine, Ser 7.82  0.50 - 1.35 mg/dL   Calcium 8.8  8.4 - 95.6 mg/dL   Total Protein 6.7  6.0 - 8.3 g/dL   Albumin 3.3 (*)  3.5 - 5.2 g/dL   AST 21  0 - 37 U/L   ALT <5  0 - 53 U/L   Alkaline Phosphatase 77  39 - 117 U/L   Total Bilirubin 0.5  0.3 - 1.2 mg/dL   GFR calc non Af Amer 57 (*) >90 mL/min   GFR calc Af Amer 66 (*) >90 mL/min  URINALYSIS, ROUTINE W REFLEX MICROSCOPIC      Result Value Range   Color, Urine YELLOW  YELLOW   APPearance CLEAR  CLEAR   Specific Gravity, Urine 1.022  1.005 - 1.030   pH 6.0  5.0 - 8.0   Glucose, UA NEGATIVE  NEGATIVE mg/dL   Hgb urine dipstick NEGATIVE  NEGATIVE   Bilirubin Urine NEGATIVE  NEGATIVE   Ketones, ur NEGATIVE  NEGATIVE mg/dL   Protein, ur NEGATIVE  NEGATIVE mg/dL   Urobilinogen, UA 0.2  0.0 - 1.0 mg/dL   Nitrite NEGATIVE  NEGATIVE   Leukocytes, UA NEGATIVE  NEGATIVE  POCT I-STAT TROPONIN I      Result Value Range   Troponin i, poc 0.02  0.00 - 0.08 ng/mL   Comment 3           OCCULT BLOOD, POC DEVICE      Result Value Range   Fecal Occult Bld NEGATIVE  NEGATIVE      DG Hip Bilateral W/Pelvis (Final result)  Result time: 04/16/13 20:52:02    Final result by Rad Results In Interface (04/16/13 20:52:02)    Narrative:   CLINICAL DATA: Fall. Bilateral hip pain.  EXAM: BILATERAL HIP WITH PELVIS - 4+ VIEW  COMPARISON: None.  FINDINGS: There is no acute bony or joint abnormality. No notable degenerative change about the hips. No evidence of avascular necrosis of the femoral heads. Visualized surrounding osseous and soft tissue structures are unremarkable.  IMPRESSION: Negative exam.   Electronically Signed By: Drusilla Kanner M.D. On: 04/16/2013 20:52      DG Knee Complete 4 Views Right (Final result)  Result time: 04/16/13 20:52:40    Final result by Rad Results In Interface (04/16/13 20:52:40)    Narrative:   CLINICAL DATA: Status post fall. Right knee pain.  EXAM: RIGHT KNEE - COMPLETE 4+ VIEW  COMPARISON: None.  FINDINGS: There is no acute bony or joint abnormality. No joint effusion. Minimal degenerative change is  present about the knee. There is some chondrocalcinosis.  IMPRESSION: No acute finding.   Electronically Signed By: Drusilla Kanner M.D. On: 04/16/2013 20:52      CT Cervical Spine Wo Contrast (Final result)  Result time: 04/16/13 20:49:35    Final result by Rad Results In Interface (04/16/13 20:49:35)    Narrative:   CLINICAL DATA: 80 year old male status post fall. Initial encounter. Dementia.  EXAM: CT HEAD WITHOUT CONTRAST  CT CERVICAL SPINE WITHOUT CONTRAST  TECHNIQUE: Multidetector CT imaging of the head and cervical spine was performed following the standard protocol without intravenous contrast. Multiplanar CT image reconstructions of the cervical spine were also generated.  COMPARISON: Head CTs 01/07/2013 and earlier.  FINDINGS: CT HEAD FINDINGS  Stable in clear visible paranasal sinuses and mastoids. Left side calvarial burr holes are unchanged. No scalp hematoma identified. No acute orbits soft tissue findings. Stable visualized osseous structures.  Calcified atherosclerosis at the skull base. Stable cerebral volume. No ventriculomegaly. No midline shift, mass effect, or evidence of intracranial mass lesion. No suspicious intracranial vascular hyperdensity. No evidence of cortically based acute infarction identified. No acute intracranial hemorrhage identified.  CT CERVICAL SPINE FINDINGS  Straightening of cervical lordosis. Visualized skull base is intact. No atlanto-occipital dissociation. C2-C3 posterior element ankylosis. Advanced C3-C4 disc, endplate, and to a lesser extent facet degeneration. Multilevel additional advanced cervical disc and endplate degeneration. Trace anterolisthesis of C7 on T1 accompanied by a right greater than left facet hypertrophy. No acute cervical fracture identified.  Grossly intact visualized upper thoracic levels. Small T2 vertebral body benign bone island.  Motion artifact through the lung apices. There is  pulmonary septal thickening. Left subclavian approach probable cardiac pacemaker leads.  IMPRESSION: 1. No acute intracranial abnormality. 2. No acute fracture or listhesis identified in the cervical spine. Ligamentous injury is not excluded. 3. Advanced multilevel cervical spine degeneration.   Electronically Signed By: Augusto Gamble M.D. On: 04/16/2013 20:49      MDM   Charles Lambert is a 80 y.o. male with a PMH of dementia, subdural hematoma, prostate enlargement, and sinoatrial node dysfunction s/p pacemaker who presents to the ED  for evaluation of fall.   Rechecks  9:45 PM = Rectal exam performed at bedside with RN present. No gross blood. No external visualized or palpable internal hemorrhoids. Good sphincter tone. No fissures. Minimal amount of thin brown stool present in rectal vault. Patient resting comfortably. No complaints. Son in ED.    Patient seen in ED for a fall at the nursing home, which allegedly mechanical in nature. Head CT negative for an acute intracranial process. CT cervical spine negative for fx, however, shows chronic DDD. No neck tenderness to palpation. No focal neurological deficits. Hip and right knee x-rays negative for fx or malalignment. Patient neurovascularly intact. Labs showed a drop in H&H from 12.1 to 10.6 in the past 3 months. Negative occult stool. Patient instructed to follow-up with PCP regarding this. UA negative for UTI. Labs otherwise unremarkable. Patient at baseline per family. Patient transferred back to [facility]. Patient and son in ED who agrees with plan.    Discharge Medication List as of 04/16/2013  9:56 PM      Final impressions: 1. Fall   2. Anemia      Luiz Iron PA-C   This patient was discussed with Dr. Arloa Koh, PA-C 04/17/13 [phone]

## 2013-04-20 ENCOUNTER — Encounter (HOSPITAL_COMMUNITY): Payer: Self-pay | Admitting: Emergency Medicine

## 2013-04-20 ENCOUNTER — Emergency Department (HOSPITAL_COMMUNITY): Payer: Medicare Other

## 2013-04-20 ENCOUNTER — Emergency Department (HOSPITAL_COMMUNITY)
Admission: EM | Admit: 2013-04-20 | Discharge: 2013-04-20 | Disposition: A | Discharge status: Home / Self Care | Payer: Medicare Other | Attending: Emergency Medicine | Admitting: Emergency Medicine

## 2013-04-20 DIAGNOSIS — IMO0002 Reserved for concepts with insufficient information to code with codable children: Secondary | ICD-10-CM

## 2013-04-20 DIAGNOSIS — Z79899 Other long term (current) drug therapy: Secondary | ICD-10-CM | POA: Insufficient documentation

## 2013-04-20 DIAGNOSIS — W010XXA Fall on same level from slipping, tripping and stumbling without subsequent striking against object, initial encounter: Secondary | ICD-10-CM | POA: Insufficient documentation

## 2013-04-20 DIAGNOSIS — Z8679 Personal history of other diseases of the circulatory system: Secondary | ICD-10-CM | POA: Insufficient documentation

## 2013-04-20 DIAGNOSIS — N400 Enlarged prostate without lower urinary tract symptoms: Secondary | ICD-10-CM | POA: Insufficient documentation

## 2013-04-20 DIAGNOSIS — W19XXXA Unspecified fall, initial encounter: Secondary | ICD-10-CM

## 2013-04-20 DIAGNOSIS — F0390 Unspecified dementia without behavioral disturbance: Secondary | ICD-10-CM | POA: Insufficient documentation

## 2013-04-20 DIAGNOSIS — S0180XA Unspecified open wound of other part of head, initial encounter: Secondary | ICD-10-CM | POA: Insufficient documentation

## 2013-04-20 DIAGNOSIS — Y939 Activity, unspecified: Secondary | ICD-10-CM | POA: Insufficient documentation

## 2013-04-20 DIAGNOSIS — Z8701 Personal history of pneumonia (recurrent): Secondary | ICD-10-CM | POA: Insufficient documentation

## 2013-04-20 DIAGNOSIS — Z87891 Personal history of nicotine dependence: Secondary | ICD-10-CM | POA: Insufficient documentation

## 2013-04-20 DIAGNOSIS — S0181XA Laceration without foreign body of other part of head, initial encounter: Secondary | ICD-10-CM

## 2013-04-20 DIAGNOSIS — S51809A Unspecified open wound of unspecified forearm, initial encounter: Secondary | ICD-10-CM | POA: Insufficient documentation

## 2013-04-20 DIAGNOSIS — Y9210 Unspecified residential institution as the place of occurrence of the external cause: Secondary | ICD-10-CM | POA: Insufficient documentation

## 2013-04-20 DIAGNOSIS — Z950 Presence of cardiac pacemaker: Secondary | ICD-10-CM | POA: Insufficient documentation

## 2013-04-20 NOTE — ED Provider Notes (Signed)
CSN: [id number]     Arrival date & time 04/20/13  1909 History   First MD Initiated Contact with Patient 04/20/13 2107     Chief Complaint  Patient presents with  . Fall     (Consider location/radiation/quality/duration/timing/severity/associated sxs/prior Treatment) Patient is a 80 y.o. male presenting with fall. The history is provided by the patient.  Fall This is a recurrent problem. The current episode started 1 to 2 hours ago. Episode frequency: once. Pertinent negatives include no chest pain, no abdominal pain, no headaches and no shortness of breath. Nothing aggravates the symptoms. Nothing relieves the symptoms. He has tried nothing for the symptoms. The treatment provided significant relief.    Past Medical History  Diagnosis Date  . Dementia   . Subdural hematoma   . Prostate enlargement   . Pacemaker   . Sinoatrial node dysfunction   . Pneumonia    Past Surgical History  Procedure Laterality Date  . Burr hole    . Knee arthroscopy Left    Family History  Problem Relation Age of Onset  . Hypertension Mother   . Hypertension Father   . Hyperlipidemia Son    History  Substance Use Topics  . Smoking status: Former Games developer  . Smokeless tobacco: Not on file     Comment: Quit in 1970s  . Alcohol Use: Yes     Comment: Occasional beer     Review of Systems  Constitutional: Negative for fever.  HENT: Negative for drooling and rhinorrhea.   Eyes: Negative for pain.  Respiratory: Negative for cough and shortness of breath.   Cardiovascular: Negative for chest pain and leg swelling.  Gastrointestinal: Negative for nausea, vomiting, abdominal pain and diarrhea.  Genitourinary: Negative for dysuria and hematuria.  Musculoskeletal: Negative for gait problem and neck pain.  Skin: Negative for color change.  Neurological: Negative for numbness and headaches.  Hematological: Negative for adenopathy.  Psychiatric/Behavioral: Negative for behavioral problems.  All other  systems reviewed and are negative.      Allergies  Review of patient's allergies indicates no known allergies.  Home Medications   Current Outpatient Rx  Name  Route  Sig  Dispense  Refill  . carbidopa-levodopa (SINEMET IR) 25-100 MG per tablet   Oral   Take 1 tablet by mouth 3 (three) times daily.   60 tablet   2   . cholecalciferol (VITAMIN D) 1000 UNITS tablet   Oral   Take 1,000 Units by mouth daily.         . finasteride (PROSCAR) 5 MG tablet   Oral   Take 5 mg by mouth at bedtime.          . lansoprazole (PREVACID) 30 MG capsule   Oral   Take 30 mg by mouth daily.          Marland Kitchen levETIRAcetam (KEPPRA) 250 MG tablet   Oral   Take 250 mg by mouth 2 (two) times daily.          . memantine (NAMENDA) 10 MG tablet   Oral   Take 10 mg by mouth 2 (two) times daily.         . modafinil (PROVIGIL) 100 MG tablet   Oral   Take 0.5 tablets (50 mg total) by mouth daily.         . rivastigmine (EXELON) 9.5 mg/24hr   Transdermal   Place 1 patch onto the skin daily.         Marland Kitchen saccharomyces boulardii (FLORASTOR)  250 MG capsule   Oral   Take 250 mg by mouth 2 (two) times daily.         . sertraline (ZOLOFT) 50 MG tablet   Oral   Take 50 mg by mouth daily.         . simvastatin (ZOCOR) 40 MG tablet   Oral   Take 40 mg by mouth every evening.         . Tamsulosin HCl (FLOMAX) 0.4 MG CAPS   Oral   Take 0.4 mg by mouth daily after supper.          . traMADol (ULTRAM) 50 MG tablet   Oral   Take 25 mg by mouth every 6 (six) hours as needed for pain. Take 1/2 tab every 6 hours as needed for pain         . vitamin B-12 (CYANOCOBALAMIN) 1000 MCG tablet   Oral   Take 1,000 mcg by mouth daily.          BP 151/63  Pulse 67  Temp(Src) 98.6 F (37 C) (Oral)  Resp 18  SpO2 97% Physical Exam  Nursing note and vitals reviewed. Constitutional: He appears well-developed and well-nourished.  HENT:  Head: Normocephalic and atraumatic.  Right Ear:  External ear normal.  Left Ear: External ear normal.  Nose: Nose normal.  Mouth/Throat: Oropharynx is clear and moist. No oropharyngeal exudate.  Mild 1cm superficial laceration to right lateral eyebrow.   Eyes: Conjunctivae and EOM are normal. Pupils are equal, round, and reactive to light.  Neck: Normal range of motion. Neck supple.  No cervical ttp.   Cardiovascular: Normal rate, regular rhythm, normal heart sounds and intact distal pulses.  Exam reveals no gallop and no friction rub.   No murmur heard. Pulmonary/Chest: Effort normal and breath sounds normal. No respiratory distress. He has no wheezes.  Abdominal: Soft. Bowel sounds are normal. He exhibits no distension. There is no tenderness. There is no rebound and no guarding.  Musculoskeletal: Normal range of motion. He exhibits edema (mild pitting edema in bilateral LE's). He exhibits no tenderness.  No focal ttp of extremities. Normal rom of bilateral hips.   Neurological: He is alert. He has normal strength. No sensory deficit.  Normal strength in all extremities.   The pt is interactive on exam. The pt has mostly disoriented responses to questioning. Son noting his mental status to be at baseline.   Skin: Skin is warm and dry.  Mild 2cm skin tear to dorsal aspect of right forearm.   Psychiatric: He has a normal mood and affect. His behavior is normal.    ED Course  LACERATION REPAIR Date/Time: 04/21/2013 12:49 PM Performed by: Purvis Sheffield, S Authorized by: Purvis Sheffield, S Consent: Verbal consent obtained. written consent not obtained. Risks and benefits: risks, benefits and alternatives were discussed Consent given by: guardian Time out: Immediately prior to procedure a "time out" was called to verify the correct patient, procedure, equipment, support staff and site/side marked as required. Laceration length: 1 cm Foreign bodies: no foreign bodies Tendon involvement: none Nerve involvement: none Vascular  damage: no Patient sedated: no Irrigation solution: hydrogen peroxide, shur clens. Skin closure: glue Patient tolerance: Patient tolerated the procedure well with no immediate complications.   (including critical care time) Labs Review Labs Reviewed - No data to display Imaging Review Ct Head Wo Contrast  04/20/2013   CLINICAL DATA:  Status post fall with right eyebrow abrasion.  EXAM: CT HEAD WITHOUT CONTRAST  TECHNIQUE: Contiguous axial images were obtained from the base of the skull through the vertex without intravenous contrast.  COMPARISON:  April 16, 2013  FINDINGS: There is chronic diffuse atrophy. Chronic bilateral periventricular white matter small vessel ischemic changes identified. There is no midline shift, hydrocephalus or mass. No acute hemorrhage or acute transcortical infarct is identified. Burr holes are identified within the skull unchanged. The visualized sinuses are clear.  IMPRESSION: No focal acute intracranial abnormality identified. Chronic diffuse atrophy. Chronic bilateral periventricular white matter small vessel ischemic change.   Electronically Signed   By: Sherian ReinWei-Chen  Lin M.D.   On: 04/20/2013 21:17    EKG Interpretation   None       MDM   Final diagnoses:  Fall  Laceration of face  Skin tear    9:59 PM 80 y.o. male who presents with a mechanical fall at his facility. EMS reports that the patient tripped over another patient's walker. No reported loss of consciousness. The patient has dementia and the son is here on exam, he is stating the patient is mentating at baseline. The patient has a mild laceration to his right lateral eyebrow and also a skin tear to his right forearm. No other obvious injuries on exam. He has normal strength and is ambulatory. Will get CT of head and repair lacs. Dermabond to face and steri-strips to right arm. The son states that he believes the patient's tetanus is up-to-date within the last 2-3 years. He declines updated Tdap at  this time.  The pt is ambulatory w/ minimal assistance, the son agrees that he is ambulating at baseline.  I have discussed the diagnosis/risks/treatment options with the patient/son and believe the pt to be eligible for discharge home to follow-up with the pt's pcp as needed. We also discussed returning to the ED immediately if new or worsening sx occur. We discussed the sx which are most concerning (e.g., AMS, new pain) that necessitate immediate return. Medications administered to the patient during their visit and any new prescriptions provided to the patient are listed below.  Medications given during this visit Medications - No data to display  Discharge Medication List as of 04/20/2013 10:38 PM         Randa SpikeForrest Mort SawyersS Cortlin Marano, MD 04/21/13 1253

## 2013-04-20 NOTE — ED Notes (Signed)
Per EMS pt tripped over another residents walker, no LOC.  Pt is from [facility].  No c/o pain.  Skin tear to right arm, laceration above right eye.

## 2013-04-20 NOTE — ED Notes (Signed)
Pt ambulated in hall w/ assistance, no c/o pain.

## 2013-05-05 ENCOUNTER — Ambulatory Visit: Payer: Medicare Other | Admitting: Neurology

## 2013-05-28 ENCOUNTER — Ambulatory Visit (INDEPENDENT_AMBULATORY_CARE_PROVIDER_SITE_OTHER): Payer: Medicare Other | Admitting: Neurology

## 2013-05-28 ENCOUNTER — Encounter: Payer: Self-pay | Admitting: Neurology

## 2013-05-28 VITALS — BP 100/56 | HR 60 | Wt 183.4 lb

## 2013-05-28 DIAGNOSIS — G3183 Dementia with Lewy bodies: Secondary | ICD-10-CM

## 2013-05-28 DIAGNOSIS — I951 Orthostatic hypotension: Secondary | ICD-10-CM

## 2013-05-28 DIAGNOSIS — G20 Parkinson's disease: Secondary | ICD-10-CM

## 2013-05-28 DIAGNOSIS — E538 Deficiency of other specified B group vitamins: Secondary | ICD-10-CM

## 2013-05-28 DIAGNOSIS — F0280 Dementia in other diseases classified elsewhere without behavioral disturbance: Secondary | ICD-10-CM

## 2013-05-28 DIAGNOSIS — G4733 Obstructive sleep apnea (adult) (pediatric): Secondary | ICD-10-CM

## 2013-05-28 NOTE — Patient Instructions (Signed)
1.  Start using knee high compression stockings 2.  Pulmonology referral and sleep evaluation 3.  Continue sinemet 25/100mg  one tablet three times daily 4.  Continue Keppra 250mg  twice daily 5.  Continue Namenda 10mg  twice and Exelon 9.5mg  patch 6.  Continue vitamin B12 daliy 7.  Encouraged to use walker 8.  Return to clinic in 43-months  [facility]  Preventing Falls in the Home   Falls are common, often dreaded events in the lives of older people. Aside from the obvious injuries and even death that may result, falls can cause wide-ranging consequences including loss of independence, mental decline, decreased activity, and mobility. Younger people are also at risk of falling, especially those with chronic illnesses and fatigue.  Ways to reduce the risk for falling:  * Examine diet and medications. Warm foods and alcohol dilate blood vessels, which can lead to dizziness when standing. Sleep aids, antidepressants, and pain medications can also increase the likelihood of a fall.  * Get a vison exam. Poor vision, cataracts, and glaucoma increase the chances of falling.  * Check foot gear. Shoes should fit snugly and have a sturdy, nonskid sole and broad, low heel.  * Participate in a physician-approved exercise program to build and maintain muscle strength and improve balance and coordination.  * Increase vitamin D intake. Vitamin D improves muscle strength and increases the amount of calcium the body is able to absorb and deposit in bones.  How to prevent falls from common hazards:  * Floors - Remove all loose wires, cords, and throw rugs. Minimize clutter. Make sure rugs are anchored and smooth. Keep furniture in its usual place.  * Chairs - Use chairs with straight backs, armrests, and firm seats. Add firm cushions to existing pieces to add height.  * Bathroom - Install grab bars and non-skid tape in the tub or shower. Use a bathtub transfer bench or a shower chair with a  back support. Use an elevated toilet seat and/or safety rails to assist standing from a low surface. Do not use towel racks or bathroom tissue holders to help you stand.  * Lighting - Make sure halls, stairways, and entrances are well-lit. Install a night light in your bathroom or hallway. Make sure there is a light switch at the top and bottom of the staircase. Turn lights on if you get up in the middle of the night. Make sure lamps or light switches are within reach of the bed if you have to get up during the night.  * Kitchen - Install non-skid rubber mats near the sink and stove. Clean spills immediately. Store frequently used utensils, pots, and pans between waist and eye level. This helps prevent reaching and bending. Sit when getting things out of the lower cupboards.  * Living room / Bedrooms - Place furniture with wide spaces in between, giving enough room to move around. Establish a route through the living room that gives you something to hold onto as you walk.  * Stairs - Make sure treads, rails, and rugs are secure. Install a rail on both sides of the stairs. If stairs are a threat, it might be helpful to arrange most of your activities on the lower level to reduce the number of times you must climb the stairs.  * Entrances and doorways - Install metal handles on the walls adjacent to the doorknobs of all doors to make it more secure as you travel through the doorway.  Tips for maintaining balance:  *  Keep at least one hand free at all times Try using a backpack or fanny pack to hold things rather than carrying them in your hands. Never carry objects in both hands when walking as this interferes with keeping your balance.  * Attempt to swing both arms from front to back while walking. This might require a conscious effort if Parkinson's disease has diminished your movement. It will, however, help you to maintain balance and posture, and reduce fatigue.  * Consciously lift your feet off the  ground when walking. Shuffling and dragging of the feet is a common culprit in losing your balance.  * When trying to navigate turns, use a "U" technique of facing forward and making a wide turn, rather than pivoting sharply.  * Try to stand with your feet shoulder-length apart. When your feet are close together for any length of time, you increase your risk of losing your balance and falling.  * Do one thing at a time. Do not try to walk and accomplish another task, such as reading or looking around. The decrease in your automatic reflexes complicates motor function, so the less distraction, the better.  * Do not wear rubber or gripping soled shoes, they might "catch" on the floor and cause tripping.  * Move slowly when changing positions. Use deliberate, concentrated movements and, if needed, use a grab bar or walking aid. Count fifteen (15) seconds after standing to begin walking.  * If balance is a continuous problem, you might want to consider a walking aid such as a cane, walking stick, or walker. Once you have mastered walking with help, you may be ready to try it again on your own.  This information is provided by [facility] and is not intended to replace the medical advice of your physician or other health care providers. Please consult your physician or other health care providers for advice regarding your specific medical condition.
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
## 2013-05-29 ENCOUNTER — Ambulatory Visit: Payer: Medicare Other | Admitting: Neurology

## 2013-06-22 ENCOUNTER — Institutional Professional Consult (permissible substitution): Payer: Medicare Other | Admitting: Pulmonary Disease

## 2013-07-13 ENCOUNTER — Encounter: Payer: Self-pay | Admitting: Pulmonary Disease

## 2013-07-13 ENCOUNTER — Ambulatory Visit (INDEPENDENT_AMBULATORY_CARE_PROVIDER_SITE_OTHER): Payer: Medicare Other | Admitting: Pulmonary Disease

## 2013-07-13 VITALS — BP 112/68 | HR 60 | Temp 98.2°F | Ht 70.0 in | Wt 182.0 lb

## 2013-07-13 DIAGNOSIS — G4733 Obstructive sleep apnea (adult) (pediatric): Secondary | ICD-10-CM | POA: Insufficient documentation

## 2013-07-13 NOTE — Progress Notes (Signed)
   Subjective:    Patient ID: Charles Lambert, male    DOB: [birth year], 80 y.o.   MRN: [id number]  HPI The patient is an 80 year old male who I've been asked to see for management of obstructive sleep apnea. He was apparently diagnosed 2 years ago with a home sleep test, and was initially treated with CPAP. Apparently, his home care company saw something on his download, and changed him to bilevel. The son is unsure why this occurred. His initial diagnosis was made in [place], but [place] was moved to the [place] area to be closer to his son because of advancing dementia. He apparently was doing well with bilevel until his dementia advanced to the point where no one was helping him put his device on at night when he went to bed.  I'm told by the son this put him out of Medicare compliance, and the home care company is now asking for another sleep study to requalify him. The son states he continues to have loud snoring as well as witnessed apneas, and has excessive sleepiness during the day with inactivity. It is unclear how much of this is because of medications, his advancing dementia, or his sleep apnea. The patient's weight has decreased 15 pounds over the last one year, and his Epworth score today is 16.   Review of Systems  Constitutional: Positive for unexpected weight change. Negative for fever.  HENT: Positive for sore throat and trouble swallowing. Negative for congestion, dental problem, ear pain, nosebleeds, postnasal drip, rhinorrhea, sinus pressure and sneezing.   Eyes: Negative for redness and itching.  Respiratory: Negative for cough, chest tightness, shortness of breath and wheezing.   Cardiovascular: Positive for leg swelling. Negative for palpitations.  Gastrointestinal: Negative for nausea and vomiting.  Genitourinary: Negative for dysuria.  Musculoskeletal: Positive for joint swelling.  Skin: Negative for rash.  Neurological: Negative for headaches.  Hematological: Does not  bruise/bleed easily.  Psychiatric/Behavioral: Positive for dysphoric mood. The patient is not nervous/anxious.        Objective:   Physical Exam Constitutional:  Well developed, no acute distress  HENT:  Nares patent without discharge  Oropharynx without exudate, palate and uvula are mildly elongated  Eyes:  Perrla, eomi, no scleral icterus  Neck:  No JVD, no TMG  Cardiovascular:  Normal rate, regular rhythm, no rubs or gallops.  No murmurs        Intact distal pulses  Pulmonary :  Normal breath sounds, no stridor or respiratory distress   No rales, rhonchi, or wheezing  Abdominal:  Soft, nondistended, bowel sounds present.  No tenderness noted.   Musculoskeletal:  No lower extremity edema noted.  Lymph Nodes:  No cervical lymphadenopathy noted  Skin:  No cyanosis noted  Neurologic:  Alert, but obviously demented, moves all 4 extremities without obvious deficit., +shuffling gait.          Assessment & Plan:

## 2013-07-13 NOTE — Patient Instructions (Signed)
Will check with [employer] and get downloads and verify that a new study must be done. Will send an order to [facility] to go ahead and restart bipap. Will arrange followup once we sort thru all of this.

## 2013-07-13 NOTE — Assessment & Plan Note (Signed)
The pt has a history of osa dating back 2 yrs, and has been on a positive pressure device since that time.  He apparently was on a cpap device, but changed to a bilevel device because of something his dme saw on his download.  He has had worsening dementia, and apparently aides at his nursing home have not been working with him on wearing each night.  Therefore, his dme now says that he is out of compliance with medicare and needs another sleep study???  I find this ridiculous given the clinical scenario described by the son.  At this point, I think we need to get more information from his home care company, and we'll see whether or not we can get the patient back on a positive pressure device consistently without having another sleep study. It is very difficult to get studies on dementia patient's, and a home study would be worthless.  The son states the patient is willing to wear CPAP, and actually does well with the device provided someone will help him get the device on each evening at bedtime.

## 2013-07-15 ENCOUNTER — Telehealth: Payer: Self-pay | Admitting: Pulmonary Disease

## 2013-07-15 DIAGNOSIS — G4733 Obstructive sleep apnea (adult) (pediatric): Secondary | ICD-10-CM

## 2013-07-15 NOTE — Telephone Encounter (Signed)
Discussed results of old sleep study and cpap download from last year with son. .  Suspect pt is having pressure induced centrals, and he is on too much pressure.  Will have his bilevel set at 8/5, and followup a download.

## 2013-07-22 ENCOUNTER — Encounter: Payer: Self-pay | Admitting: *Deleted

## 2013-08-24 ENCOUNTER — Encounter: Payer: Self-pay | Admitting: *Deleted

## 2013-08-25 ENCOUNTER — Ambulatory Visit (INDEPENDENT_AMBULATORY_CARE_PROVIDER_SITE_OTHER): Payer: Medicare Other | Admitting: Neurology

## 2013-08-25 ENCOUNTER — Encounter: Payer: Self-pay | Admitting: Neurology

## 2013-08-25 VITALS — BP 100/60 | HR 60 | Wt 176.5 lb

## 2013-08-25 DIAGNOSIS — F0280 Dementia in other diseases classified elsewhere without behavioral disturbance: Secondary | ICD-10-CM

## 2013-08-25 DIAGNOSIS — R569 Unspecified convulsions: Secondary | ICD-10-CM

## 2013-08-25 DIAGNOSIS — G3183 Dementia with Lewy bodies: Principal | ICD-10-CM
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
## 2013-08-25 NOTE — Patient Instructions (Signed)
1. Take sinemet 25/100 tablets as follows:   - Week 1: 1.5 tablet in the morning, 1 tablet in the evening and 1 tablet in the evening   - Week 2: 1.5 tablets in the morning, 1.5 tablet in the afternoon, and 1 tablet in the evening   - Week 3: 1.5 tablets three times daily   - Week 4: 2 tablet in the morning, 1.5 tablet in the evening and 1.5 tablet in the evening   - Week 5: 2 tablets in the morning, 2 tablet in the afternoon, and 1.5 tablet in the evening   - Week 6: 2 tablets three times daily and continue this dose 2.  Start using knee high stockings 3.  Call the office with any questions 4.  Return to clinic in 563-months

## 2013-08-28 ENCOUNTER — Ambulatory Visit: Payer: Medicare Other | Admitting: Neurology

## 2013-11-26 ENCOUNTER — Ambulatory Visit (INDEPENDENT_AMBULATORY_CARE_PROVIDER_SITE_OTHER): Payer: Medicare Other | Admitting: Neurology

## 2013-11-26 ENCOUNTER — Telehealth: Payer: Self-pay | Admitting: *Deleted

## 2013-11-26 ENCOUNTER — Encounter: Payer: Self-pay | Admitting: Neurology

## 2013-11-26 VITALS — BP 138/86 | HR 68 | Resp 18 | Ht 71.0 in | Wt 175.0 lb

## 2013-11-26 DIAGNOSIS — G20A1 Parkinson's disease without dyskinesia, without mention of fluctuations: Secondary | ICD-10-CM

## 2013-11-26 DIAGNOSIS — F0280 Dementia in other diseases classified elsewhere without behavioral disturbance: Secondary | ICD-10-CM

## 2013-11-26 DIAGNOSIS — G3183 Dementia with Lewy bodies: Principal | ICD-10-CM

## 2013-11-26 DIAGNOSIS — G309 Alzheimer's disease, unspecified: Secondary | ICD-10-CM

## 2013-11-26 DIAGNOSIS — G20 Parkinson's disease: Secondary | ICD-10-CM

## 2013-11-26 NOTE — Telephone Encounter (Signed)
Message copied by Vivien Rota on Thu Nov 26, 2013  3:13 PM ------      Message from: Shary Decamp      Created: Thu Nov 26, 2013  1:40 PM      Regarding: Enrique Sack from [facility]      Contact: [phone]       Please call Enrique Sack from [facility]. She states that the ordered were not attached to his paperwork. Call her if you have any questions at [phone] or just fax over the orders at       [facility] [phone] ------

## 2013-11-26 NOTE — Telephone Encounter (Signed)
Faxed over AVS with instructions for patient since I have no other orders.  Also sent a note letting Enrique Sack know that Dr. Allena Katz is out of the office until Monday.

## 2013-11-26 NOTE — Patient Instructions (Signed)
1. Take sinemet 25/100 tablets as follows:   - Week 1: 1.5 tablet in the morning, 1 tablet in the afternoon and 1 tablet in the evening   - Week 2: 1.5 tablets in the morning, 1.5 tablet in the afternoon, and 1 tablet in the evening   - Week 3: 1.5 tablets three times daily   - Week 4: 2 tablet in the morning, 1.5 tablet in the afternoon and 1.5 tablet in the evening   - Week 5: 2 tablets in the morning, 2 tablet in the afternoon, and 1.5 tablet in the evening   - Week 6: 2 tablets three times daily and continue this dose      If he develops side effects (nausea, lightheadedness, hallucinations), stay on the lower dose that he is able to tolerate. 2. Start using knee high stockings  3. Continue Namenda  BID and Exelon 9.5mg  4. Call the office with any questions  4. Return to clinic in 26-months
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
## 2014-01-05 ENCOUNTER — Telehealth: Payer: Self-pay | Admitting: Neurology

## 2014-01-05 NOTE — Telephone Encounter (Signed)
Charles Lambert from [facility] [phone] needs a written rx on sinement

## 2014-01-05 NOTE — Telephone Encounter (Signed)
Left message for Enrique SackKendra that Dr. Allena KatzPatel is out of the office until next week but if she needs written Rx sooner she can call me back.

## 2014-01-13 ENCOUNTER — Other Ambulatory Visit: Payer: Self-pay | Admitting: *Deleted

## 2014-01-13 MED ORDER — CARBIDOPA-LEVODOPA 25-100 MG PO TABS: 2.0000 | ORAL_TABLET | Freq: Three times a day (TID) | ORAL | Status: DC

## 2014-02-16 ENCOUNTER — Ambulatory Visit (INDEPENDENT_AMBULATORY_CARE_PROVIDER_SITE_OTHER): Payer: Medicare Other | Admitting: Neurology

## 2014-02-16 ENCOUNTER — Encounter: Payer: Self-pay | Admitting: Neurology

## 2014-02-16 VITALS — BP 120/74 | HR 96 | Wt 169.6 lb

## 2014-02-16 DIAGNOSIS — R258 Other abnormal involuntary movements: Secondary | ICD-10-CM

## 2014-02-16 DIAGNOSIS — G3183 Dementia with Lewy bodies: Secondary | ICD-10-CM

## 2014-02-16 DIAGNOSIS — F0280 Dementia in other diseases classified elsewhere without behavioral disturbance: Secondary | ICD-10-CM

## 2014-02-16 DIAGNOSIS — G20 Parkinson's disease: Secondary | ICD-10-CM

## 2014-02-16 DIAGNOSIS — R569 Unspecified convulsions: Secondary | ICD-10-CM

## 2014-02-16 DIAGNOSIS — E538 Deficiency of other specified B group vitamins: Secondary | ICD-10-CM

## 2014-02-16 NOTE — Patient Instructions (Signed)
You look great today!  Keep up the good work. Be careful with walking I'll see you back in 614-months, or sooner as needed
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
## 2014-05-31 ENCOUNTER — Other Ambulatory Visit: Payer: Self-pay | Admitting: Neurology

## 2014-05-31 NOTE — Telephone Encounter (Signed)
Rx sent 

## 2014-06-24 ENCOUNTER — Encounter: Payer: Self-pay | Admitting: Neurology

## 2014-06-24 ENCOUNTER — Ambulatory Visit (INDEPENDENT_AMBULATORY_CARE_PROVIDER_SITE_OTHER): Payer: Medicare Other | Admitting: Neurology

## 2014-06-24 VITALS — BP 120/70 | HR 79 | Ht 71.0 in

## 2014-06-24 DIAGNOSIS — G3183 Dementia with Lewy bodies: Secondary | ICD-10-CM

## 2014-06-24 DIAGNOSIS — G20 Parkinson's disease: Secondary | ICD-10-CM

## 2014-06-24 DIAGNOSIS — F0280 Dementia in other diseases classified elsewhere without behavioral disturbance: Secondary | ICD-10-CM

## 2014-06-24 DIAGNOSIS — R569 Unspecified convulsions: Secondary | ICD-10-CM | POA: Diagnosis not present

## 2014-06-24 DIAGNOSIS — G20C Parkinsonism, unspecified: Secondary | ICD-10-CM

## 2014-06-24 NOTE — Patient Instructions (Signed)
1.  Check UA and urine culture 2.  Consider discontinuing claritin as it may make him more sleepy 3.  Continue medications as you are taking them 4.  Return to clinic in 3 months
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
## 2014-07-06 ENCOUNTER — Emergency Department (HOSPITAL_COMMUNITY): Payer: Medicare Other

## 2014-07-06 ENCOUNTER — Emergency Department (HOSPITAL_COMMUNITY)
Admission: EM | Admit: 2014-07-06 | Discharge: 2014-07-06 | Disposition: A | Discharge status: Home / Self Care | Payer: Medicare Other | Attending: Emergency Medicine | Admitting: Emergency Medicine

## 2014-07-06 ENCOUNTER — Encounter (HOSPITAL_COMMUNITY): Payer: Self-pay | Admitting: *Deleted

## 2014-07-06 DIAGNOSIS — Z8701 Personal history of pneumonia (recurrent): Secondary | ICD-10-CM | POA: Insufficient documentation

## 2014-07-06 DIAGNOSIS — Y998 Other external cause status: Secondary | ICD-10-CM | POA: Diagnosis not present

## 2014-07-06 DIAGNOSIS — Z79899 Other long term (current) drug therapy: Secondary | ICD-10-CM | POA: Insufficient documentation

## 2014-07-06 DIAGNOSIS — S0990XA Unspecified injury of head, initial encounter: Secondary | ICD-10-CM | POA: Insufficient documentation

## 2014-07-06 DIAGNOSIS — Z87891 Personal history of nicotine dependence: Secondary | ICD-10-CM | POA: Diagnosis not present

## 2014-07-06 DIAGNOSIS — W19XXXA Unspecified fall, initial encounter: Secondary | ICD-10-CM | POA: Diagnosis not present

## 2014-07-06 DIAGNOSIS — Z23 Encounter for immunization: Secondary | ICD-10-CM | POA: Diagnosis not present

## 2014-07-06 DIAGNOSIS — F0390 Unspecified dementia without behavioral disturbance: Secondary | ICD-10-CM | POA: Insufficient documentation

## 2014-07-06 DIAGNOSIS — S0083XA Contusion of other part of head, initial encounter: Secondary | ICD-10-CM | POA: Insufficient documentation

## 2014-07-06 DIAGNOSIS — Z8679 Personal history of other diseases of the circulatory system: Secondary | ICD-10-CM | POA: Diagnosis not present

## 2014-07-06 DIAGNOSIS — Z87438 Personal history of other diseases of male genital organs: Secondary | ICD-10-CM | POA: Insufficient documentation

## 2014-07-06 DIAGNOSIS — Y939 Activity, unspecified: Secondary | ICD-10-CM | POA: Diagnosis not present

## 2014-07-06 DIAGNOSIS — S0003XA Contusion of scalp, initial encounter: Secondary | ICD-10-CM | POA: Insufficient documentation

## 2014-07-06 DIAGNOSIS — Y92129 Unspecified place in nursing home as the place of occurrence of the external cause: Secondary | ICD-10-CM | POA: Diagnosis not present

## 2014-07-06 DIAGNOSIS — Z950 Presence of cardiac pacemaker: Secondary | ICD-10-CM | POA: Diagnosis not present

## 2014-07-06 MED ORDER — TETANUS-DIPHTH-ACELL PERTUSSIS 5-2.5-18.5 LF-MCG/0.5 IM SUSP
0.5000 mL | Freq: Once | INTRAMUSCULAR | Status: AC
  Administered 2014-07-06: 0.5 mL via INTRAMUSCULAR
  Filled 2014-07-06: qty 0.5

## 2014-07-06 NOTE — ED Notes (Signed)
See triage note.  Pt is bed ridden.  Not able to help with turning or lifting his legs when his linen were changed.  Pt is incontinent.

## 2014-07-06 NOTE — Discharge Instructions (Signed)
Fall Prevention in Hospitals °As a hospital patient, your condition and the treatments you receive can increase your risk for falls. Some additional risk factors for falls in a hospital include: °· Being in an unfamiliar environment. °· Being on bed rest. °· Your surgery. °· Taking certain medicines. °· Your tubing requirements, such as intravenous (IV) therapy or catheters. °It is important that you learn how to decrease fall risks while at the hospital. Below are important tips that can help prevent falls. °SAFETY TIPS FOR PREVENTING FALLS °Talk about your risk of falling. °· Ask your caregiver why you are at risk for falling. Is it your medicine, illness, tubing placement, or something else? °· Make a plan with your caregiver to keep you safe from falls. °· Ask your caregiver or pharmacist about side effect of your medicines. Some medicines can make you dizzy or affect your coordination. °Ask for help. °· Ask for help before getting out of bed. You may need to press your call button. °· Ask for assistance in getting you safely to the toilet. °· Ask for a walker or cane to be put at your bedside. Ask that most of the side rails on your bed be placed up before your caregiver leaves the room. °· Ask family or friends to sit with you. °· Ask for things that are out of your reach, such as your glasses, hearing aids, telephone, bedside table, or call button. °Follow these tips to avoid falling: °· Stay lying or seated, rather than standing, while waiting for help. °· Wear rubber-soled slippers or shoes whenever you walk in the hospital. °· Avoid quick, sudden movements. °¨ Change positions slowly. °¨ Sit on the side of your bed before standing. °¨ Stand up slowly and wait before you start to walk. °· Let your caregiver know if there is a spill on the floor. °· Pay careful attention to the medical equipment, electrical cords, and tubes around you. °· When you need help, use your call button by your bed or in the  bathroom. Wait for one of your caregivers to help you. °· If you feel dizzy or unsure of your footing, return to bed and wait for assistance. °· Avoid being distracted by the TV, telephone, or another person in your room. °· Do not lean or support yourself on rolling objects, such as IV poles or bedside tables. °Document Released: 02/24/2000 Document Revised: 02/13/2012 Document Reviewed: 11/04/2011 °[employer]® Patient Information ©2015 [employer]. This information is not intended to replace advice given to you by your health care provider. Make sure you discuss any questions you have with your health care provider. ° °

## 2014-07-06 NOTE — ED Notes (Signed)
Family at bedside. 

## 2014-07-06 NOTE — ED Notes (Signed)
Bed: WA21 Expected date:  Expected time:  Means of arrival:  Comments: EMS 61M fall from 3'

## 2014-07-06 NOTE — ED Provider Notes (Signed)
CSN: [id number]     Arrival date & time 07/06/14  0234 History   First MD Initiated Contact with Patient 07/06/14 0235     Chief Complaint  Patient presents with  . Fall     (Consider location/radiation/quality/duration/timing/severity/associated sxs/prior Treatment) Patient is a 81 y.o. male presenting with fall.  Fall This is a new problem. The current episode started 3 to 5 hours ago. Episode frequency: once. The problem has not changed since onset.Pertinent negatives include no chest pain. Nothing aggravates the symptoms. Nothing relieves the symptoms.    Past Medical History  Diagnosis Date  . Dementia   . Subdural hematoma   . Prostate enlargement   . Pacemaker   . Sinoatrial node dysfunction   . Pneumonia    Past Surgical History  Procedure Laterality Date  . Burr hole    . Knee arthroscopy Left    Family History  Problem Relation Age of Onset  . Hypertension Mother   . Hypertension Father   . Hyperlipidemia Son    History  Substance Use Topics  . Smoking status: Former Smoker    Types: Cigarettes    Quit date: 03/12/1968  . Smokeless tobacco: Not on file     Comment: Quit in 1970s  . Alcohol Use: Yes     Comment: Occasional beer     Review of Systems  Unable to perform ROS: Dementia  Cardiovascular: Negative for chest pain.      Allergies  Review of patient's allergies indicates no known allergies.  Home Medications   Prior to Admission medications   Medication Sig Start Date End Date Taking? Authorizing Provider  carbidopa-levodopa (SINEMET IR) 25-100 MG per tablet TAKE 2 TABLETS THREE TIMES A DAY 05/31/14  Yes Donika K Patel, DO  cholecalciferol (VITAMIN D) 1000 UNITS tablet Take 1,000 Units by mouth daily.   Yes Historical Provider, MD  finasteride (PROSCAR) 5 MG tablet Take 5 mg by mouth daily.   Yes Historical Provider, MD  levETIRAcetam (KEPPRA) 250 MG tablet Take 250 mg by mouth 2 (two) times daily.  01/15/13  Yes Maryann Mikhail, DO   loratadine (CLARITIN) 10 MG tablet Take 10 mg by mouth daily as needed for allergies.    Yes Historical Provider, MD  memantine (NAMENDA) 10 MG tablet Take 10 mg by mouth 2 (two) times daily.   Yes Historical Provider, MD  rivastigmine (EXELON) 9.5 mg/24hr Place 1 patch onto the skin daily.   Yes Historical Provider, MD  saccharomyces boulardii (FLORASTOR) 250 MG capsule Take 250 mg by mouth 2 (two) times daily.   Yes Historical Provider, MD  sertraline (ZOLOFT) 50 MG tablet Take 50 mg by mouth daily.   Yes Historical Provider, MD  vitamin B-12 (CYANOCOBALAMIN) 1000 MCG tablet Take 1,000 mcg by mouth daily.   Yes Historical Provider, MD  FLUVIRIN SUSP  11/21/13   Historical Provider, MD   BP 185/84 mmHg  Pulse 58  Temp(Src) 98 F (36.7 C) (Oral)  Resp 18  SpO2 98% Physical Exam  Constitutional: He appears well-developed and well-nourished.  HENT:  Head: Normocephalic.  6 cm hematoma overlying R forehead/parietal scalp  Eyes: Conjunctivae and EOM are normal.  Neck: Normal range of motion. Neck supple.  Cardiovascular: Normal rate, regular rhythm and normal heart sounds.   Pulmonary/Chest: Effort normal and breath sounds normal. No respiratory distress.  Abdominal: He exhibits no distension. There is no tenderness. There is no rebound and no guarding.  Musculoskeletal: Normal range of motion.  Neurological: He  is alert. GCS eye subscore is 4. GCS verbal subscore is 4. GCS motor subscore is 6.  MAE in context of limited exam due to dementia  Skin: Skin is warm and dry.  Vitals reviewed.   ED Course  Procedures (including critical care time) Labs Review Labs Reviewed - No data to display  Imaging Review Dg Chest 2 View  07/06/2014   CLINICAL DATA:  Larey Seat from bed tonight, unwitnessed fall. Forehead hematoma. History of pneumonia.  EXAM: CHEST  2 VIEW  COMPARISON:  Chest radiograph January 09, 2013  FINDINGS: Cardiac silhouette is unremarkable. Mildly calcified aortic knob. It  improved aeration lungs, bibasilar strandy densities without pleural effusion or focal consolidation. No pneumothorax. Dual lead LEFT cardiac pacemaker in situ. Undersurface spurring RIGHT AC joint can result in impingement. Soft tissue planes are unremarkable.  IMPRESSION: Bibasilar atelectasis.   Electronically Signed   By: Awilda Metro   On: 07/06/2014 04:14   Dg Elbow Complete Right  07/06/2014   CLINICAL DATA:  Larey Seat from bed tonight, unwitnessed fall. Forehead hematoma. Skin tear RIGHT elbow.  EXAM: RIGHT ELBOW - COMPLETE 3+ VIEW  COMPARISON:  None.  FINDINGS: There is no evidence of fracture, dislocation, or joint effusion. There is no evidence of arthropathy or other focal bone abnormality. Soft tissues are nonsuspicious, no radiopaque foreign bodies or subcutaneous gas.  IMPRESSION: Negative.   Electronically Signed   By: Awilda Metro   On: 07/06/2014 04:15   Ct Head Wo Contrast  07/06/2014   CLINICAL DATA:  Larey Seat from bed tonight, unwitnessed fall with large forehead hematoma. History of dementia and subdural hematoma.  EXAM: CT HEAD WITHOUT CONTRAST  CT CERVICAL SPINE WITHOUT CONTRAST  TECHNIQUE: Multidetector CT imaging of the head and cervical spine was performed following the standard protocol without intravenous contrast. Multiplanar CT image reconstructions of the cervical spine were also generated.  COMPARISON:  CT of the head April 20, 2013  FINDINGS: CT HEAD FINDINGS  The ventricles and sulci are normal for age. No intraparenchymal hemorrhage, mass effect nor midline shift. Patchy supratentorial white matter hypodensities are within normal range for patient's age and though non-specific suggest sequelae of chronic small vessel ischemic disease. No acute large vascular territory infarcts. Patchy pontine hypodensities may reflect beam hardening artifact or, white matter changes.  No abnormal extra-axial fluid collections. Basal cisterns are patent. Moderate calcific atherosclerosis  of the carotid siphons.  Large RIGHT frontal scalp hematoma without subcutaneous gas or radiopaque foreign bodies. No skull fracture. LEFT frontal and parietal burr holes. The included ocular globes and orbital contents are non-suspicious. Status post bilateral ocular lens implants. LEFT sphenoid sinus mucosal thickening. Bilateral dental implants, with incorporated bone graft material extending into the RIGHT maxillary floor. Soft tissue within the external auditory canals most consistent with cerumen.  CT CERVICAL SPINE FINDINGS  Cervical vertebral bodies and posterior elements appear intact. Straightened cervical lordosis. Severe C3-4 thru C6-7 disc height loss, intradiscal calcification at multiple levels, with endplate spurring consistent with degenerative disc. C6-7 auto interbody arthrodesis. Grade 1 C7-T1 anterolisthesis on degenerative basis. C1-2 articulation maintained with moderate arthropathy. No destructive bony lesions. Fused LEFT C2-3 facets on degenerative basis. Included prevertebral paraspinal soft tissues are nonsuspicious.  Mild canal stenosis C4-5, mild to moderate C5-6. Severe RIGHT greater than LEFT C3-4, LEFT C4-5 neural foraminal narrowing.  IMPRESSION: CT HEAD: No acute intracranial process. Large RIGHT frontal scalp hematoma without skull fracture. Remote LEFT burr holes.  Involutional changes. Moderate white matter changes may reflect  chronic small vessel ischemic disease.  CT CERVICAL SPINE: Straightened cervical lordosis without acute fracture. Grade 1 C7-T1 anterolisthesis on degenerative basis.   Electronically Signed   By: Awilda Metro   On: 07/06/2014 03:56   Ct Cervical Spine Wo Contrast  07/06/2014   CLINICAL DATA:  Larey Seat from bed tonight, unwitnessed fall with large forehead hematoma. History of dementia and subdural hematoma.  EXAM: CT HEAD WITHOUT CONTRAST  CT CERVICAL SPINE WITHOUT CONTRAST  TECHNIQUE: Multidetector CT imaging of the head and cervical spine was  performed following the standard protocol without intravenous contrast. Multiplanar CT image reconstructions of the cervical spine were also generated.  COMPARISON:  CT of the head April 20, 2013  FINDINGS: CT HEAD FINDINGS  The ventricles and sulci are normal for age. No intraparenchymal hemorrhage, mass effect nor midline shift. Patchy supratentorial white matter hypodensities are within normal range for patient's age and though non-specific suggest sequelae of chronic small vessel ischemic disease. No acute large vascular territory infarcts. Patchy pontine hypodensities may reflect beam hardening artifact or, white matter changes.  No abnormal extra-axial fluid collections. Basal cisterns are patent. Moderate calcific atherosclerosis of the carotid siphons.  Large RIGHT frontal scalp hematoma without subcutaneous gas or radiopaque foreign bodies. No skull fracture. LEFT frontal and parietal burr holes. The included ocular globes and orbital contents are non-suspicious. Status post bilateral ocular lens implants. LEFT sphenoid sinus mucosal thickening. Bilateral dental implants, with incorporated bone graft material extending into the RIGHT maxillary floor. Soft tissue within the external auditory canals most consistent with cerumen.  CT CERVICAL SPINE FINDINGS  Cervical vertebral bodies and posterior elements appear intact. Straightened cervical lordosis. Severe C3-4 thru C6-7 disc height loss, intradiscal calcification at multiple levels, with endplate spurring consistent with degenerative disc. C6-7 auto interbody arthrodesis. Grade 1 C7-T1 anterolisthesis on degenerative basis. C1-2 articulation maintained with moderate arthropathy. No destructive bony lesions. Fused LEFT C2-3 facets on degenerative basis. Included prevertebral paraspinal soft tissues are nonsuspicious.  Mild canal stenosis C4-5, mild to moderate C5-6. Severe RIGHT greater than LEFT C3-4, LEFT C4-5 neural foraminal narrowing.  IMPRESSION: CT  HEAD: No acute intracranial process. Large RIGHT frontal scalp hematoma without skull fracture. Remote LEFT burr holes.  Involutional changes. Moderate white matter changes may reflect chronic small vessel ischemic disease.  CT CERVICAL SPINE: Straightened cervical lordosis without acute fracture. Grade 1 C7-T1 anterolisthesis on degenerative basis.   Electronically Signed   By: Awilda Metro   On: 07/06/2014 03:56     EKG Interpretation None      MDM   Final diagnoses:  Fall, initial encounter    81 y.o. male with pertinent PMH of dementia, prior subdural presents after unwitnessed fall, likely from bed.  Pt is a nursing home resident with recurrent recent falls, went to bed, then on routine check was found to be on the ground with large hematoma to R forehead/parietal scalp.  No neuro deficits in context of limited exam.    Wu with scalp hematoma.  Son at bedside confirms pt is at his baseline. Stable to dc home.    I have reviewed all laboratory and imaging studies if ordered as above  1. Fall, initial encounter         Mirian Mo, MD 07/06/14 [phone]

## 2014-07-06 NOTE — ED Notes (Signed)
Per EMS, pt from [facility] home.  Fell from his bed tonight-unwitnessed.  Hematoma to forehead noted.  Skin tear to R elbow noted.  Pt is alert and oriented

## 2014-08-06 ENCOUNTER — Other Ambulatory Visit: Payer: Self-pay | Admitting: Neurology

## 2014-08-06 NOTE — Telephone Encounter (Signed)
Rx sent 

## 2014-09-28 ENCOUNTER — Ambulatory Visit: Payer: Medicare Other | Admitting: Neurology

## 2014-09-30 ENCOUNTER — Encounter: Payer: Self-pay | Admitting: Neurology

## 2014-09-30 ENCOUNTER — Telehealth: Payer: Self-pay | Admitting: Family Medicine

## 2014-09-30 ENCOUNTER — Other Ambulatory Visit (HOSPITAL_COMMUNITY): Payer: Self-pay | Admitting: Neurology

## 2014-09-30 ENCOUNTER — Ambulatory Visit (INDEPENDENT_AMBULATORY_CARE_PROVIDER_SITE_OTHER): Payer: Medicare Other | Admitting: Neurology

## 2014-09-30 VITALS — BP 118/70 | HR 84 | Ht 70.0 in | Wt 165.0 lb

## 2014-09-30 DIAGNOSIS — G3183 Dementia with Lewy bodies: Secondary | ICD-10-CM | POA: Diagnosis not present

## 2014-09-30 DIAGNOSIS — R471 Dysarthria and anarthria: Secondary | ICD-10-CM | POA: Diagnosis not present

## 2014-09-30 DIAGNOSIS — R1310 Dysphagia, unspecified: Secondary | ICD-10-CM

## 2014-09-30 DIAGNOSIS — F0390 Unspecified dementia without behavioral disturbance: Secondary | ICD-10-CM

## 2014-09-30 DIAGNOSIS — F0280 Dementia in other diseases classified elsewhere without behavioral disturbance: Secondary | ICD-10-CM

## 2014-09-30 DIAGNOSIS — R569 Unspecified convulsions: Secondary | ICD-10-CM

## 2014-09-30 DIAGNOSIS — G20 Parkinson's disease: Secondary | ICD-10-CM | POA: Diagnosis not present
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
## 2014-09-30 NOTE — Telephone Encounter (Signed)
Spoke with patient's son about app. Patient is scheduled for a Modified Barium Swallow on 10/05/14 @ 1:00 pm with a 12:45 pm arrival. [facility] Radiology Dept.

## 2014-10-05 ENCOUNTER — Ambulatory Visit (HOSPITAL_COMMUNITY)
Admission: RE | Admit: 2014-10-05 | Discharge: 2014-10-05 | Disposition: A | Discharge status: Home / Self Care | Payer: Medicare Other | Source: Ambulatory Visit | Attending: Neurology | Admitting: Neurology

## 2014-10-05 DIAGNOSIS — R1313 Dysphagia, pharyngeal phase: Secondary | ICD-10-CM | POA: Diagnosis not present

## 2014-10-05 DIAGNOSIS — F0390 Unspecified dementia without behavioral disturbance: Secondary | ICD-10-CM

## 2014-10-05 DIAGNOSIS — G20 Parkinson's disease: Secondary | ICD-10-CM

## 2014-10-05 DIAGNOSIS — R1310 Dysphagia, unspecified: Secondary | ICD-10-CM
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
## 2014-10-10 ENCOUNTER — Emergency Department (HOSPITAL_COMMUNITY): Payer: Medicare Other

## 2014-10-10 ENCOUNTER — Emergency Department (HOSPITAL_COMMUNITY)
Admission: EM | Admit: 2014-10-10 | Discharge: 2014-10-10 | Disposition: A | Discharge status: Skilled Nursing Facility | Payer: Medicare Other | Attending: Emergency Medicine | Admitting: Emergency Medicine

## 2014-10-10 ENCOUNTER — Encounter (HOSPITAL_COMMUNITY): Payer: Self-pay | Admitting: Emergency Medicine

## 2014-10-10 DIAGNOSIS — Y9389 Activity, other specified: Secondary | ICD-10-CM | POA: Diagnosis not present

## 2014-10-10 DIAGNOSIS — Y998 Other external cause status: Secondary | ICD-10-CM | POA: Insufficient documentation

## 2014-10-10 DIAGNOSIS — Z950 Presence of cardiac pacemaker: Secondary | ICD-10-CM | POA: Insufficient documentation

## 2014-10-10 DIAGNOSIS — W19XXXA Unspecified fall, initial encounter: Secondary | ICD-10-CM

## 2014-10-10 DIAGNOSIS — Z8679 Personal history of other diseases of the circulatory system: Secondary | ICD-10-CM | POA: Insufficient documentation

## 2014-10-10 DIAGNOSIS — Z87891 Personal history of nicotine dependence: Secondary | ICD-10-CM | POA: Diagnosis not present

## 2014-10-10 DIAGNOSIS — Z79899 Other long term (current) drug therapy: Secondary | ICD-10-CM | POA: Diagnosis not present

## 2014-10-10 DIAGNOSIS — G20 Parkinson's disease: Secondary | ICD-10-CM | POA: Diagnosis not present

## 2014-10-10 DIAGNOSIS — Y92122 Bedroom in nursing home as the place of occurrence of the external cause: Secondary | ICD-10-CM | POA: Insufficient documentation

## 2014-10-10 DIAGNOSIS — Z792 Long term (current) use of antibiotics: Secondary | ICD-10-CM | POA: Diagnosis not present

## 2014-10-10 DIAGNOSIS — Z87438 Personal history of other diseases of male genital organs: Secondary | ICD-10-CM | POA: Insufficient documentation

## 2014-10-10 DIAGNOSIS — W1839XA Other fall on same level, initial encounter: Secondary | ICD-10-CM | POA: Diagnosis not present

## 2014-10-10 DIAGNOSIS — Z8701 Personal history of pneumonia (recurrent): Secondary | ICD-10-CM | POA: Diagnosis not present

## 2014-10-10 DIAGNOSIS — S0081XA Abrasion of other part of head, initial encounter: Secondary | ICD-10-CM | POA: Insufficient documentation

## 2014-10-10 DIAGNOSIS — F0390 Unspecified dementia without behavioral disturbance: Secondary | ICD-10-CM | POA: Insufficient documentation

## 2014-10-10 DIAGNOSIS — S0990XA Unspecified injury of head, initial encounter: Secondary | ICD-10-CM | POA: Diagnosis present

## 2014-10-10 DIAGNOSIS — S0091XA Abrasion of unspecified part of head, initial encounter: Secondary | ICD-10-CM

## 2014-10-10 DIAGNOSIS — N179 Acute kidney failure, unspecified: Secondary | ICD-10-CM | POA: Diagnosis not present

## 2014-10-10 DIAGNOSIS — S40811A Abrasion of right upper arm, initial encounter: Secondary | ICD-10-CM | POA: Insufficient documentation

## 2014-10-10 LAB — BASIC METABOLIC PANEL
Anion gap: 5 (ref 5–15)
BUN: 29 mg/dL — AB (ref 6–20)
CO2: 28 mmol/L (ref 22–32)
CREATININE: 1.47 mg/dL — AB (ref 0.61–1.24)
Calcium: 8.9 mg/dL (ref 8.9–10.3)
Chloride: 106 mmol/L (ref 101–111)
GFR calc Af Amer: 49 mL/min — ABNORMAL LOW (ref >60)
GFR, EST NON AFRICAN AMERICAN: 42 mL/min — AB (ref >60)
GLUCOSE: 105 mg/dL — AB (ref 65–99)
Potassium: 4.1 mmol/L (ref 3.5–5.1)
Sodium: 139 mmol/L (ref 135–145)

## 2014-10-10 LAB — CBC
HCT: 41 % (ref 39.0–52.0)
Hemoglobin: 13.1 g/dL (ref 13.0–17.0)
MCH: 30.2 pg (ref 26.0–34.0)
MCHC: 32 g/dL (ref 30.0–36.0)
MCV: 94.5 fL (ref 78.0–100.0)
Platelets: 208 10*3/uL (ref 150–400)
RBC: 4.34 MIL/uL (ref 4.22–5.81)
RDW: 13.9 % (ref 11.5–15.5)
WBC: 5.9 10*3/uL (ref 4.0–10.5)

## 2014-10-10 MED ORDER — SODIUM CHLORIDE 0.9 % IV BOLUS (SEPSIS)
500.0000 mL | Freq: Once | INTRAVENOUS | Status: AC
  Administered 2014-10-10: 500 mL via INTRAVENOUS

## 2014-10-10 NOTE — ED Notes (Signed)
Brought in by EMS from [facility] [place] with c/o head injury after his unwitnessed fall.  Per EMS, staff at the facility reported that pt was placed in bed, and the next thing happened was that pt was observed lying on carpeted floor beside his bed--- apparently rolled off bed.  Pt's bed was placed on the lowest position while pt was in bed.  Has severe dementia, only oriented to self.  Pt arrived to ED alert and responsive--- able to tell first name.  Laceration to right forehead and skin tear to right elbow noted--- bleeding to areas controlled.

## 2014-10-10 NOTE — Discharge Instructions (Signed)

## 2014-10-10 NOTE — ED Notes (Signed)
MD at bedside. 

## 2014-10-10 NOTE — ED Notes (Signed)
Called [facility] and per Fayrene Fearing no ETA, they will come as soon as possible

## 2014-10-10 NOTE — ED Notes (Signed)
Patient transported to CT 

## 2014-10-10 NOTE — ED Notes (Signed)
Elevated bp reported to leslie.

## 2014-10-10 NOTE — ED Notes (Signed)
Bed: WU98 Expected date:  Expected time:  Means of arrival:  Comments: EMS 50M fall

## 2014-10-10 NOTE — ED Provider Notes (Signed)
CSN: [id number]     Arrival date & time 10/10/14  0645 History   First MD Initiated Contact with Patient 10/10/14 [phone]     Chief Complaint  Patient presents with  . Fall  . Head Injury     (Consider location/radiation/quality/duration/timing/severity/associated sxs/prior Treatment) HPI Comments: Pt. 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 He has a history of atrial fibrillation s/p pacemaker placement without chronic anticoagulation. He presents this am from SNF. Staff noted that he was placed in his bed in the lowest position and was observed in bed. Upon return check the patient was found on the ground with abrasion to his right head and his right elbow. These were bleeding, but bleeding was controlled. He was at baseline in terms of orientation to name, but was brought to the ED as a result of unobserved fall. He did not have any bony tenderness or deformity on exam.   Patient is a 81 y.o. male presenting with fall and head injury. The history is provided by the patient.  Fall This is a recurrent problem. The current episode started today. The problem occurs intermittently. The problem has been gradually worsening. Associated symptoms include weakness. Pertinent negatives include no abdominal pain, chest pain, fever, headaches, neck pain, numbness, rash or vomiting. Nothing aggravates the symptoms. He has tried rest for the symptoms. The treatment provided mild relief.  Head Injury Associated symptoms: no headaches, no neck pain, no numbness and no vomiting     Past Medical History  Diagnosis Date  . Dementia   . Subdural hematoma   . Prostate enlargement   . Pacemaker   . Sinoatrial node dysfunction   . Pneumonia    Past Surgical History  Procedure Laterality Date  . Burr hole      . Knee arthroscopy Left    Family History  Problem Relation Age of Onset  . Hypertension Mother   . Hypertension Father   . Hyperlipidemia Son    History  Substance Use Topics  . Smoking status: Former Smoker    Types: Cigarettes    Quit date: 03/12/1968  . Smokeless tobacco: Not on file     Comment: Quit in 1970s  . Alcohol Use: Yes     Comment: Occasional beer     Review of Systems  Constitutional: Negative for fever.  HENT: Negative.  Negative for facial swelling, nosebleeds and voice change.   Eyes: Negative.  Negative for pain, redness and visual disturbance.  Respiratory: Negative.  Negative for chest tightness and shortness of breath.   Cardiovascular: Negative.  Negative for chest pain.  Gastrointestinal: Negative.  Negative for vomiting and abdominal pain.  Endocrine: Negative.  Negative for polydipsia and polyuria.  Genitourinary: Negative.  Negative for urgency and frequency.  Musculoskeletal: Negative.  Negative for back pain and neck pain.  Skin: Positive for wound. Negative for rash.  Allergic/Immunologic: Negative.   Neurological: Positive for weakness. Negative for light-headedness, numbness and headaches.  Hematological: Negative.  Negative for adenopathy.  Psychiatric/Behavioral: Positive for confusion and decreased concentration.      Allergies  Review of patient's allergies indicates no known allergies.  Home Medications   Prior to Admission medications   Medication Sig Start Date End Date Taking? Authorizing Provider  carbidopa-levodopa (SINEMET IR) 25-100 MG per tablet TAKE 2 TABLETS THREE TIMES  A DAY 08/06/14  Yes Donika K Patel, DO  cholecalciferol (VITAMIN D) 1000 UNITS tablet Take 1,000 Units by mouth daily.   Yes Historical Provider, MD  ciprofloxacin (CIPRO) 250 MG tablet Take 250 mg by mouth 2 (two) times daily.   Yes Historical Provider, MD  Cranberry 425 MG CAPS Take 425 mg by mouth 2 (two) times daily.   Yes Historical Provider, MD   finasteride (PROSCAR) 5 MG tablet Take 5 mg by mouth daily.   Yes Historical Provider, MD  levETIRAcetam (KEPPRA) 250 MG tablet Take 250 mg by mouth 2 (two) times daily.  01/15/13  Yes Maryann Mikhail, DO  loratadine (CLARITIN) 10 MG tablet Take 10 mg by mouth daily as needed for allergies.    Yes Historical Provider, MD  memantine (NAMENDA) 10 MG tablet Take 10 mg by mouth 2 (two) times daily.   Yes Historical Provider, MD  rivastigmine (EXELON) 9.5 mg/24hr Place 1 patch onto the skin daily.   Yes Historical Provider, MD  sertraline (ZOLOFT) 50 MG tablet Take 50 mg by mouth daily.   Yes Historical Provider, MD  vitamin B-12 (CYANOCOBALAMIN) 1000 MCG tablet Take 1,000 mcg by mouth daily.   Yes Historical Provider, MD   BP 137/84 mmHg  Pulse 82  Temp(Src) 98.6 F (37 C) (Oral)  Resp 18  SpO2 100% Physical Exam  Constitutional: He appears well-developed and well-nourished. No distress.  HENT:  Head: Normocephalic. Head is with abrasion. Head is without contusion and without laceration. Hair is normal.    Right Ear: Hearing, tympanic membrane, external ear and ear canal normal.  Left Ear: Hearing, tympanic membrane, external ear and ear canal normal.  Nose: Nose normal.  Mouth/Throat: Uvula is midline, oropharynx is clear and moist and mucous membranes are normal.  Eyes: Conjunctivae and EOM are normal. Pupils are equal, round, and reactive to light.  Neck: Normal range of motion. Neck supple. No JVD present. No tracheal deviation present. No thyromegaly present.  Cardiovascular: Normal rate, regular rhythm, normal heart sounds and intact distal pulses.  Exam reveals no gallop and no friction rub.   No murmur heard. Pulmonary/Chest: Effort normal and breath sounds normal. No stridor. No respiratory distress. He has no wheezes. He has no rales.  Abdominal: Soft. Bowel sounds are normal. He exhibits no distension and no mass. There is no tenderness. There is no rebound and no guarding.   Musculoskeletal: Normal range of motion. He exhibits no edema or tenderness.       Right forearm: He exhibits no tenderness, no bony tenderness, no swelling, no edema and no deformity.       Arms: Lymphadenopathy:    He has no cervical adenopathy.  Neurological: He is alert.  Skin: Skin is warm and dry. No rash noted. He is not diaphoretic. No erythema. No pallor.       ED Course  Procedures (including critical care time) Labs Review Labs Reviewed  BASIC METABOLIC PANEL - Abnormal; Notable for the following:    Glucose, Bld 105 (*)    BUN 29 (*)    Creatinine, Ser 1.47 (*)    GFR calc non Af Amer 42 (*)    GFR calc Af Amer 49 (*)    All other components within normal limits  CBC    Imaging Review Ct Head Wo Contrast  10/10/2014   CLINICAL DATA:  Unwitnessed fall now with laceration to the right forehead.  EXAM: CT HEAD WITHOUT CONTRAST  CT CERVICAL SPINE WITHOUT CONTRAST  TECHNIQUE: Multidetector CT imaging of the head and cervical spine was performed following the standard protocol without intravenous contrast. Multiplanar CT image reconstructions of the cervical spine were also generated.  COMPARISON:  Head and C-spine CT - 07/06/2014; head CT - 04/20/2013  FINDINGS: CT HEAD FINDINGS  There is a very minimal amount of subcutaneous stranding about the superior lateral aspect of the right side of the forehead (image 21, series 2). This finding is without associated radiopaque foreign body or displaced calvarial fracture.  Burr holes are again seen within the left frontal and parietal lobes.  Stable sequela of advanced atrophy with sulcal prominence, prominence of the bifrontal extra-axial spaces and centralized volume loss with mild commensurate ex vacuo dilatation of the ventricular system. Scattered periventricular hypodensities compatible microvascular ischemic disease. No CT evidence of acute large territory infarct. No intraparenchymal extra-axial mass or hemorrhage. Normal size and  configuration of the ventricles and basilar cisterns. No midline shift. Minimal intracranial atherosclerosis. Limited visualization of the paranasal sinuses and mastoid air cells is normal. Debris is seen within the bilateral external auditory canals.  CT CERVICAL SPINE FINDINGS  C1 to the superior endplate of T2 is imaged.  Normal alignment of the cervical spine. No anterolisthesis or retrolisthesis. The dens is normally positioned between the lateral masses of C1. Normal atlantodental and atlantoaxial articulations. There is a tiny well corticated ossicle about the right-sided the tip of the dens (representative sagittal image 16, series 10, coronal image 17, series 9).  There is complete opacification of the left C2-C3 transverse facets and partial ossification of the right C2-C3 transverse facets and likely partial ossification of the C2-C3 and the C6-C7 intervertebral disc spaces.  No fracture or static subluxation of the cervical spine. Cervical vertebral body heights are preserved. Regional soft tissues are normal.  Mild multilevel cervical spine DDD, likely worse at C5 - C6 and to a lesser extent, C3-C4 and C4-C5 with disc space height loss, endplate irregularity and small posteriorly directed disc osteophyte complexes at these locations.  There is partial ossification of the nuchal ligament posterior to the C4 and C5 spinous processes.  Scattered atherosclerotic plaque within the bilateral carotid bulbs. There is mild diffuse heterogeneity of the thyroid gland without discrete nodule on this noncontrast examination. No definitive bulky cervical lymphadenopathy on this noncontrast examination.  Limited visualization of lung apices is normal.  IMPRESSION: Head CT Impression:  1. Minimal amount of subcutaneous stranding about the right side of the forehead without associated radiopaque foreign body, displaced calvarial fracture or acute intracranial process. 2. Similar findings of advanced atrophy and  microvascular ischemic disease. Cervical spine CT Impression:  1. No fracture or static subluxation of cervical spine. 2. Mild multilevel cervical spine DDD, likely worse at C5-C6.   Electronically Signed   By: Simonne Come M.D.   On: 10/10/2014 08:30   Ct Cervical Spine Wo Contrast  10/10/2014   CLINICAL DATA:  Unwitnessed fall now with laceration to the right forehead.  EXAM: CT HEAD WITHOUT CONTRAST  CT CERVICAL SPINE WITHOUT CONTRAST  TECHNIQUE: Multidetector CT imaging of the head and cervical spine was performed following the standard protocol without intravenous contrast. Multiplanar CT image reconstructions of the cervical spine were also generated.  COMPARISON:  Head and C-spine CT - 07/06/2014; head CT - 04/20/2013  FINDINGS: CT HEAD FINDINGS  There is a very minimal amount of subcutaneous stranding about the superior lateral aspect of the right side of the forehead (image 21, series 2). This finding is  without associated radiopaque foreign body or displaced calvarial fracture.  Burr holes are again seen within the left frontal and parietal lobes.  Stable sequela of advanced atrophy with sulcal prominence, prominence of the bifrontal extra-axial spaces and centralized volume loss with mild commensurate ex vacuo dilatation of the ventricular system. Scattered periventricular hypodensities compatible microvascular ischemic disease. No CT evidence of acute large territory infarct. No intraparenchymal extra-axial mass or hemorrhage. Normal size and configuration of the ventricles and basilar cisterns. No midline shift. Minimal intracranial atherosclerosis. Limited visualization of the paranasal sinuses and mastoid air cells is normal. Debris is seen within the bilateral external auditory canals.  CT CERVICAL SPINE FINDINGS  C1 to the superior endplate of T2 is imaged.  Normal alignment of the cervical spine. No anterolisthesis or retrolisthesis. The dens is normally positioned between the lateral masses of  C1. Normal atlantodental and atlantoaxial articulations. There is a tiny well corticated ossicle about the right-sided the tip of the dens (representative sagittal image 16, series 10, coronal image 17, series 9).  There is complete opacification of the left C2-C3 transverse facets and partial ossification of the right C2-C3 transverse facets and likely partial ossification of the C2-C3 and the C6-C7 intervertebral disc spaces.  No fracture or static subluxation of the cervical spine. Cervical vertebral body heights are preserved. Regional soft tissues are normal.  Mild multilevel cervical spine DDD, likely worse at C5 - C6 and to a lesser extent, C3-C4 and C4-C5 with disc space height loss, endplate irregularity and small posteriorly directed disc osteophyte complexes at these locations.  There is partial ossification of the nuchal ligament posterior to the C4 and C5 spinous processes.  Scattered atherosclerotic plaque within the bilateral carotid bulbs. There is mild diffuse heterogeneity of the thyroid gland without discrete nodule on this noncontrast examination. No definitive bulky cervical lymphadenopathy on this noncontrast examination.  Limited visualization of lung apices is normal.  IMPRESSION: Head CT Impression:  1. Minimal amount of subcutaneous stranding about the right side of the forehead without associated radiopaque foreign body, displaced calvarial fracture or acute intracranial process. 2. Similar findings of advanced atrophy and microvascular ischemic disease. Cervical spine CT Impression:  1. No fracture or static subluxation of cervical spine. 2. Mild multilevel cervical spine DDD, likely worse at C5-C6.   Electronically Signed   By: Simonne Come M.D.   On: 10/10/2014 08:30     EKG Interpretation None      MDM   Final diagnoses:  Fall, initial encounter  Abrasion of head, initial encounter  Abrasion of right arm, initial encounter  AKI (acute kidney injury)    81 y/o M here s/p  Unwitnessed fall at nursing home with small abrasion over right head  And right elbow. Will get CT head / neck wo contrast. No bony tenderness or deformity to suggest other bony abnormality. Pt. Moving all extremities well.   9:35 am. CT head without evidence of fracture or acute bleed. CT neck without acute injury. BMET with mildly elevated creatinine. Rehydrated with 500cc NS. Recommend continued po hydration at discharge. Mental status at baseline. Safe for discharge to home with close follow up by SNF physician.    Yolande Jolly, MD 10/10/14 1610  Nelva Nay, MD 10/10/14 [phone]

## 2014-10-20 ENCOUNTER — Other Ambulatory Visit: Payer: Self-pay | Admitting: Neurology

## 2014-10-20 NOTE — Telephone Encounter (Signed)
Rx sent 

## 2014-10-22 ENCOUNTER — Telehealth: Payer: Self-pay | Admitting: Neurology

## 2014-10-22 NOTE — Telephone Encounter (Signed)
Pt's son Kaylan called and wanted a call back to discuss some new things and medications/Dawn CB# [phone]

## 2014-10-22 NOTE — Telephone Encounter (Signed)
I called son back and he said that patient has been having falls, sleeping more and acting groggy.  NP from [facility] came in to evaluate and suggested patient come off of Namenda, Excel patch, Keppra and Zoloft.  She would also like to change Sinemet from 3 to 4 doses.  Please advise.

## 2014-10-27 NOTE — Telephone Encounter (Signed)
Please advise 

## 2014-10-27 NOTE — Telephone Encounter (Signed)
Patient's son given recommendation per Dr. Allena Katz.  He said that his dad was checked for UTI.  He will contact the NP and let her know.

## 2014-10-27 NOTE — Telephone Encounter (Signed)
I do not recommend that he comes of all these medications at the same time.  He needs to be checked for infection such as UTI (his primary team at the facility should be able to order this), because this may cause him to behavior differently.  OK to discontinue namenda and exelon patch.  Keep Keppra, zoloft, and sinemet as prescribed for now.    Harlynn Kimbell K. Allena Katz, DO

## 2015-01-13 ENCOUNTER — Telehealth: Payer: Self-pay | Admitting: Neurology

## 2015-01-13 ENCOUNTER — Ambulatory Visit: Payer: Medicare Other | Admitting: Neurology

## 2015-01-13 NOTE — Telephone Encounter (Signed)
[facility]-Nurse Practitioner called in regards to PT and would like to get a call back at [phone]/Dawn

## 2015-01-13 NOTE — Telephone Encounter (Signed)
Called NP from [facility] back and she said that patient has increased stiffness and is falling more.  Should she increase med or have patient schedule another appointment to see you?  Please advise.

## 2015-01-13 NOTE — Telephone Encounter (Signed)
Schedule follow-up, please have them fax his current medication list.

## 2015-01-14 NOTE — Telephone Encounter (Signed)
Spoke with patient's nurse and she will have patient's facility fax a copy of meds and have patient reschedule appt.

## 2015-01-24 ENCOUNTER — Telehealth: Payer: Self-pay | Admitting: Neurology

## 2015-01-24 NOTE — Telephone Encounter (Signed)
We will need to place him on a wait list for f/u visit if there are any cancellations.  In the meantime, he needs to limit his walking and use a rollator or chair more.  Unfortunately, this is most likely the progression of his disease.  Donika K. Allena KatzPatel, DO

## 2015-01-24 NOTE — Telephone Encounter (Signed)
PT's son Charles Lambert called in regards to his Dad and that he has had a couple of falls/Dawn CB# [phone]

## 2015-01-24 NOTE — Telephone Encounter (Signed)
Patient has had 4 falls this week.  The son did not know about patient's last appointment.  He would like to have his dad seen but was told there are no appointments.  Please advise.

## 2015-01-25 NOTE — Telephone Encounter (Signed)
Patient's son given the instructions.

## 2015-01-26 ENCOUNTER — Ambulatory Visit (INDEPENDENT_AMBULATORY_CARE_PROVIDER_SITE_OTHER): Payer: Medicare Other | Admitting: Neurology

## 2015-01-26 ENCOUNTER — Encounter: Payer: Self-pay | Admitting: Neurology

## 2015-01-26 VITALS — BP 90/60 | HR 60 | Wt 161.1 lb

## 2015-01-26 DIAGNOSIS — F0280 Dementia in other diseases classified elsewhere without behavioral disturbance: Secondary | ICD-10-CM

## 2015-01-26 DIAGNOSIS — I951 Orthostatic hypotension: Secondary | ICD-10-CM | POA: Diagnosis not present

## 2015-01-26 DIAGNOSIS — G218 Other secondary parkinsonism: Secondary | ICD-10-CM | POA: Diagnosis not present

## 2015-01-26 DIAGNOSIS — R569 Unspecified convulsions: Secondary | ICD-10-CM

## 2015-01-26 DIAGNOSIS — G3183 Dementia with Lewy bodies: Secondary | ICD-10-CM | POA: Diagnosis not present

## 2015-01-26 MED ORDER — FLUDROCORTISONE ACETATE 0.1 MG PO TABS: 0.1000 mg | ORAL_TABLET | Freq: Every day | ORAL | Status: AC

## 2015-01-26 MED ORDER — CARBIDOPA-LEVODOPA 25-100 MG PO TABS: 2.0000 | ORAL_TABLET | Freq: Four times a day (QID) | ORAL | Status: AC

## 2015-01-26 NOTE — Patient Instructions (Addendum)
1.  Increase sinemet 25/100mg  to 2 tablets to four times daily - 8am, 12pm, 4pm, and 8pm. 2.  Start florinef 0.1mg  tablet daily 3.  Make positional changes slowly 4.  Encouraged to use a walker and wheelchair to prevent falls 5.  Return to clinic in 3-4 months
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
## 2015-01-31 ENCOUNTER — Telehealth: Payer: Self-pay | Admitting: Neurology

## 2015-01-31 NOTE — Telephone Encounter (Signed)
Pt/son/ Homero FellersFrank Jr/ called to inform that his father has fallen at least 5 times from last visit//and needs a dr's order for med start [phone]

## 2015-02-01 NOTE — Telephone Encounter (Signed)
Spoke with patient's son and informed him that I have told Dr. Allena KatzPatel about the falls.  He requested that we switch times for patient's sinemet.  We will switch to 6am, 12 noon, 2 pm and 6 pm per Dr. Allena KatzPatel.  [facility] [place] notified.

## 2015-03-09 ENCOUNTER — Telehealth: Payer: Self-pay | Admitting: *Deleted

## 2015-03-09 NOTE — Telephone Encounter (Signed)
Charles Lambert from [facility] called to let us know that patient has just fallen again.  His blood pressure was 110/58.  Dr. Allena KatzPatel informed and would like to have patient increase the Florinef to twice a day.

## 2015-04-27 ENCOUNTER — Emergency Department (HOSPITAL_COMMUNITY): Payer: Medicare Other

## 2015-04-27 ENCOUNTER — Encounter (HOSPITAL_COMMUNITY): Payer: Self-pay | Admitting: Emergency Medicine

## 2015-04-27 ENCOUNTER — Emergency Department (HOSPITAL_COMMUNITY)
Admission: EM | Admit: 2015-04-27 | Discharge: 2015-04-27 | Disposition: A | Discharge status: Home / Self Care | Payer: Medicare Other | Attending: Emergency Medicine | Admitting: Emergency Medicine

## 2015-04-27 DIAGNOSIS — Z87438 Personal history of other diseases of male genital organs: Secondary | ICD-10-CM | POA: Diagnosis not present

## 2015-04-27 DIAGNOSIS — Y9389 Activity, other specified: Secondary | ICD-10-CM | POA: Diagnosis not present

## 2015-04-27 DIAGNOSIS — Z8701 Personal history of pneumonia (recurrent): Secondary | ICD-10-CM | POA: Insufficient documentation

## 2015-04-27 DIAGNOSIS — Y92128 Other place in nursing home as the place of occurrence of the external cause: Secondary | ICD-10-CM | POA: Diagnosis not present

## 2015-04-27 DIAGNOSIS — F0390 Unspecified dementia without behavioral disturbance: Secondary | ICD-10-CM | POA: Diagnosis not present

## 2015-04-27 DIAGNOSIS — Z7951 Long term (current) use of inhaled steroids: Secondary | ICD-10-CM | POA: Diagnosis not present

## 2015-04-27 DIAGNOSIS — W19XXXA Unspecified fall, initial encounter: Secondary | ICD-10-CM

## 2015-04-27 DIAGNOSIS — Z87891 Personal history of nicotine dependence: Secondary | ICD-10-CM | POA: Insufficient documentation

## 2015-04-27 DIAGNOSIS — Z950 Presence of cardiac pacemaker: Secondary | ICD-10-CM | POA: Insufficient documentation

## 2015-04-27 DIAGNOSIS — S0083XA Contusion of other part of head, initial encounter: Secondary | ICD-10-CM | POA: Diagnosis not present

## 2015-04-27 DIAGNOSIS — W1839XA Other fall on same level, initial encounter: Secondary | ICD-10-CM | POA: Diagnosis not present

## 2015-04-27 DIAGNOSIS — S0990XA Unspecified injury of head, initial encounter: Secondary | ICD-10-CM | POA: Diagnosis present

## 2015-04-27 DIAGNOSIS — Z79899 Other long term (current) drug therapy: Secondary | ICD-10-CM | POA: Diagnosis not present

## 2015-04-27 DIAGNOSIS — Y998 Other external cause status: Secondary | ICD-10-CM | POA: Diagnosis not present

## 2015-04-27 DIAGNOSIS — Z8679 Personal history of other diseases of the circulatory system: Secondary | ICD-10-CM | POA: Insufficient documentation

## 2015-04-27 NOTE — ED Notes (Signed)
Patient transported to CT 

## 2015-04-27 NOTE — ED Notes (Signed)
Bed: [facility] Expected date:  Expected time:  Means of arrival:  Comments: EMS fall  

## 2015-04-27 NOTE — ED Provider Notes (Signed)
CSN: [id number]     Arrival date & time 04/27/15  1354 History   First MD Initiated Contact with Patient 04/27/15 1507     Chief Complaint  Patient presents with  . Fall  . Epistaxis     Level V caveat: Dementia  HPI A witnessed fall today at the nursing facility.  Patient has frequent falls.  He presents with swelling and abrasion to his left side of his face.  He comes immobilized in c-collar.  His son reports baseline mental status at this time.   Past Medical History  Diagnosis Date  . Dementia   . Subdural hematoma (HCC)   . Prostate enlargement   . Pacemaker   . Sinoatrial node dysfunction (HCC)   . Pneumonia    Past Surgical History  Procedure Laterality Date  . Burr hole    . Knee arthroscopy Left    Family History  Problem Relation Age of Onset  . Hypertension Mother   . Hypertension Father   . Hyperlipidemia Son    Social History  Substance Use Topics  . Smoking status: Former Smoker    Types: Cigarettes    Quit date: 03/12/1968  . Smokeless tobacco: None     Comment: Quit in 1970s  . Alcohol Use: Yes     Comment: Occasional beer     Review of Systems  Unable to perform ROS: Dementia      Allergies  Review of patient's allergies indicates no known allergies.  Home Medications   Prior to Admission medications   Medication Sig Start Date End Date Taking? Authorizing Provider  carbidopa-levodopa (SINEMET IR) 25-100 MG tablet Take 2 tablets by mouth 4 (four) times daily. 01/26/15  Yes Donika K Patel, DO  Cranberry 425 MG CAPS Take 425 mg by mouth 2 (two) times daily.   Yes Historical Provider, MD  ENSURE (ENSURE) Take 237 mLs by mouth 3 (three) times daily between meals. *Vanilla Flavor*   Yes Historical Provider, MD  finasteride (PROSCAR) 5 MG tablet Take 5 mg by mouth daily.   Yes Historical Provider, MD  fludrocortisone (FLORINEF) 0.1 MG tablet Take 1 tablet (0.1 mg total) by mouth daily. Patient taking differently: Take 0.1 mg by mouth 2 (two)  times daily.  01/26/15  Yes Donika K Patel, DO  levETIRAcetam (KEPPRA) 250 MG tablet Take 250 mg by mouth 2 (two) times daily.  01/15/13  Yes Maryann Mikhail, DO  loratadine (CLARITIN) 10 MG tablet Take 10 mg by mouth daily.    Yes Historical Provider, MD  protein supplement (RESOURCE BENEPROTEIN) POWD Take 1 scoop by mouth 3 (three) times daily with meals. 04/27/15  Yes Historical Provider, MD  sertraline (ZOLOFT) 50 MG tablet Take 50 mg by mouth daily.   Yes Historical Provider, MD  vitamin B-12 (CYANOCOBALAMIN) 1000 MCG tablet Take 1,000 mcg by mouth daily.   Yes Historical Provider, MD   BP 178/90 mmHg  Pulse 60  Resp 18  SpO2 97% Physical Exam  Constitutional: He is oriented to person, place, and time. He appears well-developed and well-nourished.  HENT:  Head: Normocephalic.  Swelling of the left side of face including the zygomatic arch without obvious deformity.  Small skin tear overlying the zygomatic arch.  Extra movements are intact.  Mild swelling of his nasal bridge without obvious deformity.   Eyes: EOM are normal.  Neck: Normal range of motion.  Cardiovascular: Normal rate, regular rhythm, normal heart sounds and intact distal pulses.   Pulmonary/Chest: Effort normal and  breath sounds normal. No respiratory distress.  Abdominal: Soft. He exhibits no distension. There is no tenderness.  Musculoskeletal: Normal range of motion.  Neurological: He is alert and oriented to person, place, and time.  Skin: Skin is warm and dry.  Psychiatric: He has a normal mood and affect. Judgment normal.  Nursing note and vitals reviewed.   ED Course  Procedures (including critical care time)  LACERATION REPAIR Performed by: Lyanne Co Consent: Verbal consent obtained. Risks and benefits: risks, benefits and alternatives were discussed Patient identity confirmed: provided demographic data Time out performed prior to procedure Prepped and Draped in normal sterile fashion Wound  explored Laceration Location: left face Laceration Length: 1.5cm No Foreign Bodies seen or palpated Anesthesia: None  Amount of cleaning: standard Skin closure: Steri-Strips  Number of sutures or staples: None  Technique: Steri-Strips  Patient tolerance: Patient tolerated the procedure well with no immediate complications.   Labs Review Labs Reviewed - No data to display  Imaging Review Ct Head Wo Contrast  04/27/2015  CLINICAL DATA:  Unwitnessed fall. Laceration to left cheek. Bloody nose. History of dementia. History of subdural hematoma and resultant burr holes. EXAM: CT HEAD WITHOUT CONTRAST CT MAXILLOFACIAL WITHOUT CONTRAST CT CERVICAL SPINE WITHOUT CONTRAST TECHNIQUE: Multidetector CT imaging of the head, cervical spine, and maxillofacial structures were performed using the standard protocol without intravenous contrast. Multiplanar CT image reconstructions of the cervical spine and maxillofacial structures were also generated. COMPARISON:  Head and C-spine CTs of 10/10/2014. FINDINGS: CT HEAD FINDINGS Sinuses/Soft tissues: Left frontal parietal craniotomy. No skull fracture. Partial opacification of left ethmoid air cells, without traversing fracture. Paranasal sinuses clear. Intracranial: Advanced cerebral atrophy, normal for age. Moderate low density in the periventricular white matter likely related to small vessel disease. No mass lesion, hemorrhage, hydrocephalus, acute infarct, intra-axial, or extra-axial fluid collection. CT MAXILLOFACIAL FINDINGS Soft tissues: Bilateral carotid atherosclerosis. Soft tissue swelling about the nose, worse on the left. Example image 60/series 3. Normal appearance of the orbits and globes. Bones: No fluid in the paranasal sinuses. No fracture identified. Nasal septum is similarly deviated to the right. Pterygoid plates intact. Mandibular condyles located. Zygomatic arches unremarkable. Coronal reformats demonstrate intact orbital floors. CT CERVICAL SPINE  FINDINGS Spinal visualization through the bottom of T2. Prevertebral soft tissues are within normal limits. No apical pneumothorax. Multilevel spondylosis with areas of central canal and neural foraminal narrowing. Skull base intact. Maintenance of vertebral body height and alignment. Multilevel facet arthropathy, without malalignment. Coronal reformats demonstrate a normal C1-C2 articulation. IMPRESSION: 1.  No acute intracranial abnormality. 2.  Cerebral atrophy and small vessel ischemic change. 3. Soft tissue swelling about the nose, without fracture or dislocation. 4. Small left mastoid effusion. 5. Advanced cervical spondylosis, without acute fracture or subluxation. Electronically Signed   By: Jeronimo Greaves M.D.   On: 04/27/2015 17:02   Ct Cervical Spine Wo Contrast  04/27/2015  CLINICAL DATA:  Unwitnessed fall. Laceration to left cheek. Bloody nose. History of dementia. History of subdural hematoma and resultant burr holes. EXAM: CT HEAD WITHOUT CONTRAST CT MAXILLOFACIAL WITHOUT CONTRAST CT CERVICAL SPINE WITHOUT CONTRAST TECHNIQUE: Multidetector CT imaging of the head, cervical spine, and maxillofacial structures were performed using the standard protocol without intravenous contrast. Multiplanar CT image reconstructions of the cervical spine and maxillofacial structures were also generated. COMPARISON:  Head and C-spine CTs of 10/10/2014. FINDINGS: CT HEAD FINDINGS Sinuses/Soft tissues: Left frontal parietal craniotomy. No skull fracture. Partial opacification of left ethmoid air cells, without traversing fracture. Paranasal  sinuses clear. Intracranial: Advanced cerebral atrophy, normal for age. Moderate low density in the periventricular white matter likely related to small vessel disease. No mass lesion, hemorrhage, hydrocephalus, acute infarct, intra-axial, or extra-axial fluid collection. CT MAXILLOFACIAL FINDINGS Soft tissues: Bilateral carotid atherosclerosis. Soft tissue swelling about the nose,  worse on the left. Example image 60/series 3. Normal appearance of the orbits and globes. Bones: No fluid in the paranasal sinuses. No fracture identified. Nasal septum is similarly deviated to the right. Pterygoid plates intact. Mandibular condyles located. Zygomatic arches unremarkable. Coronal reformats demonstrate intact orbital floors. CT CERVICAL SPINE FINDINGS Spinal visualization through the bottom of T2. Prevertebral soft tissues are within normal limits. No apical pneumothorax. Multilevel spondylosis with areas of central canal and neural foraminal narrowing. Skull base intact. Maintenance of vertebral body height and alignment. Multilevel facet arthropathy, without malalignment. Coronal reformats demonstrate a normal C1-C2 articulation. IMPRESSION: 1.  No acute intracranial abnormality. 2.  Cerebral atrophy and small vessel ischemic change. 3. Soft tissue swelling about the nose, without fracture or dislocation. 4. Small left mastoid effusion. 5. Advanced cervical spondylosis, without acute fracture or subluxation. Electronically Signed   By: Jeronimo Greaves M.D.   On: 04/27/2015 17:02   Ct Maxillofacial Wo Cm  04/27/2015  CLINICAL DATA:  Unwitnessed fall. Laceration to left cheek. Bloody nose. History of dementia. History of subdural hematoma and resultant burr holes. EXAM: CT HEAD WITHOUT CONTRAST CT MAXILLOFACIAL WITHOUT CONTRAST CT CERVICAL SPINE WITHOUT CONTRAST TECHNIQUE: Multidetector CT imaging of the head, cervical spine, and maxillofacial structures were performed using the standard protocol without intravenous contrast. Multiplanar CT image reconstructions of the cervical spine and maxillofacial structures were also generated. COMPARISON:  Head and C-spine CTs of 10/10/2014. FINDINGS: CT HEAD FINDINGS Sinuses/Soft tissues: Left frontal parietal craniotomy. No skull fracture. Partial opacification of left ethmoid air cells, without traversing fracture. Paranasal sinuses clear. Intracranial:  Advanced cerebral atrophy, normal for age. Moderate low density in the periventricular white matter likely related to small vessel disease. No mass lesion, hemorrhage, hydrocephalus, acute infarct, intra-axial, or extra-axial fluid collection. CT MAXILLOFACIAL FINDINGS Soft tissues: Bilateral carotid atherosclerosis. Soft tissue swelling about the nose, worse on the left. Example image 60/series 3. Normal appearance of the orbits and globes. Bones: No fluid in the paranasal sinuses. No fracture identified. Nasal septum is similarly deviated to the right. Pterygoid plates intact. Mandibular condyles located. Zygomatic arches unremarkable. Coronal reformats demonstrate intact orbital floors. CT CERVICAL SPINE FINDINGS Spinal visualization through the bottom of T2. Prevertebral soft tissues are within normal limits. No apical pneumothorax. Multilevel spondylosis with areas of central canal and neural foraminal narrowing. Skull base intact. Maintenance of vertebral body height and alignment. Multilevel facet arthropathy, without malalignment. Coronal reformats demonstrate a normal C1-C2 articulation. IMPRESSION: 1.  No acute intracranial abnormality. 2.  Cerebral atrophy and small vessel ischemic change. 3. Soft tissue swelling about the nose, without fracture or dislocation. 4. Small left mastoid effusion. 5. Advanced cervical spondylosis, without acute fracture or subluxation. Electronically Signed   By: Jeronimo Greaves M.D.   On: 04/27/2015 17:02   I have personally reviewed and evaluated these images and lab results as part of my medical decision-making.   EKG Interpretation None      MDM   Final diagnoses:  Fall, initial encounter  Facial contusion, initial encounter  Head injury, initial encounter    Contusion without fracture.  Skin tear repaired with Steri-Strips for age motion bilateral knees and hips.  Discharge home in good condition.  Azalia Bilis, MD 04/27/15 [phone]

## 2015-04-27 NOTE — ED Notes (Signed)
Per EMS, unwitnessed fall from SNF. Bloody nose, lac to left cheek. Bleeding has stopped from both areas, not on any blood thinners, per EMS pt is at his baseline. Is on meds to increase his BP because he has problems with his BP dropping. Hx of dementia.

## 2015-04-27 NOTE — ED Notes (Signed)
Patient's son states that he will transfer patient back to [facility]. This RN will call report to nurse at facility.

## 2015-04-27 NOTE — Progress Notes (Signed)
CSW met with patient at bedside. However, he was not effectively communicative. Son was present, who states he is also the patient's POA.   Son confirms that patient is from [facility], and states that patient has been living at the facility for 3 years. Also, Son confirms patient presents to [facility] due to fall. Son states he is not sure what caused patient to fall.  Son informed CSW that patient lives in the memory unit of facility, and that he receives assistance with completing his ADL's. Son states patient falls often. Son informed CSW that upon patient's discharge he would feel comfortable with him returning to the facility.  Son states he has no questions for CSW at this time.   Willette Brace [phone] ED CSW 04/27/2015 5:14 PM

## 2015-04-28 ENCOUNTER — Emergency Department (HOSPITAL_COMMUNITY)
Admission: EM | Admit: 2015-04-28 | Discharge: 2015-04-29 | Disposition: A | Discharge status: Home / Self Care | Payer: Medicare Other | Attending: Emergency Medicine | Admitting: Emergency Medicine

## 2015-04-28 ENCOUNTER — Emergency Department (HOSPITAL_COMMUNITY): Payer: Medicare Other

## 2015-04-28 ENCOUNTER — Encounter (HOSPITAL_COMMUNITY): Payer: Self-pay | Admitting: *Deleted

## 2015-04-28 ENCOUNTER — Ambulatory Visit: Payer: Medicare Other | Admitting: *Deleted

## 2015-04-28 DIAGNOSIS — Y9389 Activity, other specified: Secondary | ICD-10-CM | POA: Insufficient documentation

## 2015-04-28 DIAGNOSIS — S0993XA Unspecified injury of face, initial encounter: Secondary | ICD-10-CM | POA: Diagnosis present

## 2015-04-28 DIAGNOSIS — Z950 Presence of cardiac pacemaker: Secondary | ICD-10-CM | POA: Diagnosis not present

## 2015-04-28 DIAGNOSIS — F0390 Unspecified dementia without behavioral disturbance: Secondary | ICD-10-CM | POA: Insufficient documentation

## 2015-04-28 DIAGNOSIS — Z87891 Personal history of nicotine dependence: Secondary | ICD-10-CM | POA: Insufficient documentation

## 2015-04-28 DIAGNOSIS — N400 Enlarged prostate without lower urinary tract symptoms: Secondary | ICD-10-CM | POA: Insufficient documentation

## 2015-04-28 DIAGNOSIS — S0081XA Abrasion of other part of head, initial encounter: Secondary | ICD-10-CM | POA: Insufficient documentation

## 2015-04-28 DIAGNOSIS — I495 Sick sinus syndrome: Secondary | ICD-10-CM

## 2015-04-28 DIAGNOSIS — Y998 Other external cause status: Secondary | ICD-10-CM | POA: Diagnosis not present

## 2015-04-28 DIAGNOSIS — Z79899 Other long term (current) drug therapy: Secondary | ICD-10-CM | POA: Insufficient documentation

## 2015-04-28 DIAGNOSIS — W050XXA Fall from non-moving wheelchair, initial encounter: Secondary | ICD-10-CM | POA: Diagnosis not present

## 2015-04-28 DIAGNOSIS — R0989 Other specified symptoms and signs involving the circulatory and respiratory systems: Secondary | ICD-10-CM | POA: Insufficient documentation

## 2015-04-28 DIAGNOSIS — Y9289 Other specified places as the place of occurrence of the external cause: Secondary | ICD-10-CM | POA: Insufficient documentation

## 2015-04-28 DIAGNOSIS — S0031XA Abrasion of nose, initial encounter: Secondary | ICD-10-CM | POA: Insufficient documentation

## 2015-04-28 DIAGNOSIS — S0012XA Contusion of left eyelid and periocular area, initial encounter: Secondary | ICD-10-CM | POA: Diagnosis not present

## 2015-04-28 DIAGNOSIS — Z8679 Personal history of other diseases of the circulatory system: Secondary | ICD-10-CM | POA: Insufficient documentation

## 2015-04-28 DIAGNOSIS — Z8701 Personal history of pneumonia (recurrent): Secondary | ICD-10-CM | POA: Insufficient documentation

## 2015-04-28 LAB — CBC WITH DIFFERENTIAL/PLATELET
BASOS PCT: 0 %
Basophils Absolute: 0 10*3/uL (ref 0.0–0.1)
EOS ABS: 0.1 10*3/uL (ref 0.0–0.7)
EOS PCT: 1 %
HCT: 32.5 % — ABNORMAL LOW (ref 39.0–52.0)
HEMOGLOBIN: 10.7 g/dL — AB (ref 13.0–17.0)
LYMPHS ABS: 0.9 10*3/uL (ref 0.7–4.0)
Lymphocytes Relative: 14 %
MCH: 30.7 pg (ref 26.0–34.0)
MCHC: 32.9 g/dL (ref 30.0–36.0)
MCV: 93.1 fL (ref 78.0–100.0)
MONO ABS: 0.4 10*3/uL (ref 0.1–1.0)
MONOS PCT: 7 %
NEUTROS PCT: 78 %
Neutro Abs: 5.3 10*3/uL (ref 1.7–7.7)
PLATELETS: 194 10*3/uL (ref 150–400)
RBC: 3.49 MIL/uL — ABNORMAL LOW (ref 4.22–5.81)
RDW: 14.8 % (ref 11.5–15.5)
WBC: 6.7 10*3/uL (ref 4.0–10.5)

## 2015-04-28 LAB — BASIC METABOLIC PANEL
Anion gap: 8 (ref 5–15)
BUN: 21 mg/dL — AB (ref 6–20)
CALCIUM: 8.8 mg/dL — AB (ref 8.9–10.3)
CHLORIDE: 101 mmol/L (ref 101–111)
CO2: 30 mmol/L (ref 22–32)
CREATININE: 1.01 mg/dL (ref 0.61–1.24)
GFR calc non Af Amer: >60 mL/min (ref >60)
GLUCOSE: 103 mg/dL — AB (ref 65–99)
Potassium: 3.7 mmol/L (ref 3.5–5.1)
Sodium: 139 mmol/L (ref 135–145)

## 2015-04-28 MED ORDER — DIPHENHYDRAMINE HCL 25 MG PO CAPS
25.0000 mg | ORAL_CAPSULE | Freq: Once | ORAL | Status: AC
  Administered 2015-04-28: 25 mg via ORAL
  Filled 2015-04-28: qty 1

## 2015-04-28 MED ORDER — LORAZEPAM 1 MG PO TABS
0.5000 mg | ORAL_TABLET | Freq: Once | ORAL | Status: DC
Start: 2015-04-28 — End: 2015-04-28
  Filled 2015-04-28: qty 1

## 2015-04-28 NOTE — ED Notes (Signed)
Interrogation of [employer] pacer completed.

## 2015-04-28 NOTE — ED Provider Notes (Signed)
BAYLEY HURN 05/24/1927   The patient is a 82 year old male history of frequent falls, was seen yesterday in the ER for a fall and presented again today after subsequent fall with facial abrasions, staff denies loss of consciousness, patient is not on blood thinners. Patient was signed out to me at shift change, with CT scans pending.  CT head, max face, cervical spine was negative for acute injury.   Labs pertinent for anemia, Hb 10.7, which is decreased from most recent labs which were 6 months ago however he has had similar labs in the past.  His son at bedside is mostly concerned with a slight change in behavior over the last several days, he has been "fidgity," prior to his fall today and fall yesterday, and has had some recent medication adjustments. The patient's son states he will follow up with neurology outpatient with these concerns.    The patient has been seen by Dr. Manus Gunning, who agrees to discharge home tonight with negative scans.  [facility] will assist pt home tonight, he was discharged in good condition.      Danelle Berry, PA-C 05/01/15 2237  Glynn Octave, MD 05/02/15 1435

## 2015-04-28 NOTE — ED Notes (Signed)
Called phlebotomy to draw blood.  

## 2015-04-28 NOTE — ED Notes (Signed)
Patient transported to CT 

## 2015-04-28 NOTE — ED Provider Notes (Signed)
CSN: [id number]     Arrival date & time 04/28/15  1626 History   First MD Initiated Contact with Patient 04/28/15 1631     Chief Complaint  Patient presents with  . Fall     (Consider location/radiation/quality/duration/timing/severity/associated sxs/prior Treatment) The history is provided by medical records, the EMS personnel and the nursing home.     Pt with hx dementia, pacemaker, with frequent falls seen in ED yesterday for fall presents today for same.  Per EMS, pt was in wheelchair at facility and leaned forward to pick something up and toppled forward.  This was witnessed.  Staff denies LOC.  Pt is not on blood thinners.  He is reported to be at his baseline mental status.    Level V caveat for confusion.    Past Medical History  Diagnosis Date  . Dementia   . Subdural hematoma (HCC)   . Prostate enlargement   . Pacemaker   . Sinoatrial node dysfunction (HCC)   . Pneumonia    Past Surgical History  Procedure Laterality Date  . Burr hole    . Knee arthroscopy Left    Family History  Problem Relation Age of Onset  . Hypertension Mother   . Hypertension Father   . Hyperlipidemia Son    Social History  Substance Use Topics  . Smoking status: Former Smoker    Types: Cigarettes    Quit date: 03/12/1968  . Smokeless tobacco: Not on file     Comment: Quit in 1970s  . Alcohol Use: Yes     Comment: Occasional beer     Review of Systems  Unable to perform ROS: Dementia      Allergies  Review of patient's allergies indicates no known allergies.  Home Medications   Prior to Admission medications   Medication Sig Start Date End Date Taking? Authorizing Provider  carbidopa-levodopa (SINEMET IR) 25-100 MG tablet Take 2 tablets by mouth 4 (four) times daily. 01/26/15   Donika K Patel, DO  Cranberry 425 MG CAPS Take 425 mg by mouth 2 (two) times daily.    Historical Provider, MD  ENSURE (ENSURE) Take 237 mLs by mouth 3 (three) times daily between meals. *Vanilla  Flavor*    Historical Provider, MD  finasteride (PROSCAR) 5 MG tablet Take 5 mg by mouth daily.    Historical Provider, MD  fludrocortisone (FLORINEF) 0.1 MG tablet Take 1 tablet (0.1 mg total) by mouth daily. Patient taking differently: Take 0.1 mg by mouth 2 (two) times daily.  01/26/15   Donika Concha Se, DO  levETIRAcetam (KEPPRA) 250 MG tablet Take 250 mg by mouth 2 (two) times daily.  01/15/13   Maryann Mikhail, DO  loratadine (CLARITIN) 10 MG tablet Take 10 mg by mouth daily.     Historical Provider, MD  protein supplement (RESOURCE BENEPROTEIN) POWD Take 1 scoop by mouth 3 (three) times daily with meals. 04/27/15   Historical Provider, MD  sertraline (ZOLOFT) 50 MG tablet Take 50 mg by mouth daily.    Historical Provider, MD  vitamin B-12 (CYANOCOBALAMIN) 1000 MCG tablet Take 1,000 mcg by mouth daily.    Historical Provider, MD   There were no vitals taken for this visit. Physical Exam  Constitutional: He appears well-developed and well-nourished. No distress.  HENT:  Head: Normocephalic.  Abrasions to forehead, nose.  Left periorbital hematoma.    Neck: Neck supple.  Cardiovascular: Normal rate and regular rhythm.   Pulmonary/Chest: Effort normal. No accessory muscle usage. No respiratory distress. He  has no wheezes. He has rhonchi.  Abdominal: Soft. He exhibits no distension and no mass. There is no tenderness. There is no rebound and no guarding.  Neurological: He is alert. He exhibits normal muscle tone.  Moves all extremities equally, strength is symmetric. Follows commands.  Oriented x 1.    Skin: He is not diaphoretic.  Nursing note and vitals reviewed.   ED Course  Procedures (including critical care time) Labs Review Labs Reviewed  URINE CULTURE  URINALYSIS, ROUTINE W REFLEX MICROSCOPIC (NOT AT [facility])  BASIC METABOLIC PANEL  CBC WITH DIFFERENTIAL/PLATELET    Imaging Review Ct Head Wo Contrast  04/27/2015  CLINICAL DATA:  Unwitnessed fall. Laceration to left cheek.  Bloody nose. History of dementia. History of subdural hematoma and resultant burr holes. EXAM: CT HEAD WITHOUT CONTRAST CT MAXILLOFACIAL WITHOUT CONTRAST CT CERVICAL SPINE WITHOUT CONTRAST TECHNIQUE: Multidetector CT imaging of the head, cervical spine, and maxillofacial structures were performed using the standard protocol without intravenous contrast. Multiplanar CT image reconstructions of the cervical spine and maxillofacial structures were also generated. COMPARISON:  Head and C-spine CTs of 10/10/2014. FINDINGS: CT HEAD FINDINGS Sinuses/Soft tissues: Left frontal parietal craniotomy. No skull fracture. Partial opacification of left ethmoid air cells, without traversing fracture. Paranasal sinuses clear. Intracranial: Advanced cerebral atrophy, normal for age. Moderate low density in the periventricular white matter likely related to small vessel disease. No mass lesion, hemorrhage, hydrocephalus, acute infarct, intra-axial, or extra-axial fluid collection. CT MAXILLOFACIAL FINDINGS Soft tissues: Bilateral carotid atherosclerosis. Soft tissue swelling about the nose, worse on the left. Example image 60/series 3. Normal appearance of the orbits and globes. Bones: No fluid in the paranasal sinuses. No fracture identified. Nasal septum is similarly deviated to the right. Pterygoid plates intact. Mandibular condyles located. Zygomatic arches unremarkable. Coronal reformats demonstrate intact orbital floors. CT CERVICAL SPINE FINDINGS Spinal visualization through the bottom of T2. Prevertebral soft tissues are within normal limits. No apical pneumothorax. Multilevel spondylosis with areas of central canal and neural foraminal narrowing. Skull base intact. Maintenance of vertebral body height and alignment. Multilevel facet arthropathy, without malalignment. Coronal reformats demonstrate a normal C1-C2 articulation. IMPRESSION: 1.  No acute intracranial abnormality. 2.  Cerebral atrophy and small vessel ischemic  change. 3. Soft tissue swelling about the nose, without fracture or dislocation. 4. Small left mastoid effusion. 5. Advanced cervical spondylosis, without acute fracture or subluxation. Electronically Signed   By: Jeronimo Greaves M.D.   On: 04/27/2015 17:02   Ct Cervical Spine Wo Contrast  04/27/2015  CLINICAL DATA:  Unwitnessed fall. Laceration to left cheek. Bloody nose. History of dementia. History of subdural hematoma and resultant burr holes. EXAM: CT HEAD WITHOUT CONTRAST CT MAXILLOFACIAL WITHOUT CONTRAST CT CERVICAL SPINE WITHOUT CONTRAST TECHNIQUE: Multidetector CT imaging of the head, cervical spine, and maxillofacial structures were performed using the standard protocol without intravenous contrast. Multiplanar CT image reconstructions of the cervical spine and maxillofacial structures were also generated. COMPARISON:  Head and C-spine CTs of 10/10/2014. FINDINGS: CT HEAD FINDINGS Sinuses/Soft tissues: Left frontal parietal craniotomy. No skull fracture. Partial opacification of left ethmoid air cells, without traversing fracture. Paranasal sinuses clear. Intracranial: Advanced cerebral atrophy, normal for age. Moderate low density in the periventricular white matter likely related to small vessel disease. No mass lesion, hemorrhage, hydrocephalus, acute infarct, intra-axial, or extra-axial fluid collection. CT MAXILLOFACIAL FINDINGS Soft tissues: Bilateral carotid atherosclerosis. Soft tissue swelling about the nose, worse on the left. Example image 60/series 3. Normal appearance of the orbits and globes. Bones:  No fluid in the paranasal sinuses. No fracture identified. Nasal septum is similarly deviated to the right. Pterygoid plates intact. Mandibular condyles located. Zygomatic arches unremarkable. Coronal reformats demonstrate intact orbital floors. CT CERVICAL SPINE FINDINGS Spinal visualization through the bottom of T2. Prevertebral soft tissues are within normal limits. No apical pneumothorax.  Multilevel spondylosis with areas of central canal and neural foraminal narrowing. Skull base intact. Maintenance of vertebral body height and alignment. Multilevel facet arthropathy, without malalignment. Coronal reformats demonstrate a normal C1-C2 articulation. IMPRESSION: 1.  No acute intracranial abnormality. 2.  Cerebral atrophy and small vessel ischemic change. 3. Soft tissue swelling about the nose, without fracture or dislocation. 4. Small left mastoid effusion. 5. Advanced cervical spondylosis, without acute fracture or subluxation. Electronically Signed   By: Jeronimo Greaves M.D.   On: 04/27/2015 17:02   Ct Maxillofacial Wo Cm  04/27/2015  CLINICAL DATA:  Unwitnessed fall. Laceration to left cheek. Bloody nose. History of dementia. History of subdural hematoma and resultant burr holes. EXAM: CT HEAD WITHOUT CONTRAST CT MAXILLOFACIAL WITHOUT CONTRAST CT CERVICAL SPINE WITHOUT CONTRAST TECHNIQUE: Multidetector CT imaging of the head, cervical spine, and maxillofacial structures were performed using the standard protocol without intravenous contrast. Multiplanar CT image reconstructions of the cervical spine and maxillofacial structures were also generated. COMPARISON:  Head and C-spine CTs of 10/10/2014. FINDINGS: CT HEAD FINDINGS Sinuses/Soft tissues: Left frontal parietal craniotomy. No skull fracture. Partial opacification of left ethmoid air cells, without traversing fracture. Paranasal sinuses clear. Intracranial: Advanced cerebral atrophy, normal for age. Moderate low density in the periventricular white matter likely related to small vessel disease. No mass lesion, hemorrhage, hydrocephalus, acute infarct, intra-axial, or extra-axial fluid collection. CT MAXILLOFACIAL FINDINGS Soft tissues: Bilateral carotid atherosclerosis. Soft tissue swelling about the nose, worse on the left. Example image 60/series 3. Normal appearance of the orbits and globes. Bones: No fluid in the paranasal sinuses. No  fracture identified. Nasal septum is similarly deviated to the right. Pterygoid plates intact. Mandibular condyles located. Zygomatic arches unremarkable. Coronal reformats demonstrate intact orbital floors. CT CERVICAL SPINE FINDINGS Spinal visualization through the bottom of T2. Prevertebral soft tissues are within normal limits. No apical pneumothorax. Multilevel spondylosis with areas of central canal and neural foraminal narrowing. Skull base intact. Maintenance of vertebral body height and alignment. Multilevel facet arthropathy, without malalignment. Coronal reformats demonstrate a normal C1-C2 articulation. IMPRESSION: 1.  No acute intracranial abnormality. 2.  Cerebral atrophy and small vessel ischemic change. 3. Soft tissue swelling about the nose, without fracture or dislocation. 4. Small left mastoid effusion. 5. Advanced cervical spondylosis, without acute fracture or subluxation. Electronically Signed   By: Jeronimo Greaves M.D.   On: 04/27/2015 17:02   I have personally reviewed and evaluated these images and lab results as part of my medical decision-making.   EKG Interpretation None       7:45 PM Son agrees to PO benadryl.  Will reattempt CT scans.   Did not want patient sedated with ativan for CT scans.  CT initially took patient to CT and pt was too agitated, would not stay still for scan.  Ativan ordered and son declined, was concerned that it was not listed in patient's chart that he could not have that - states he didn't wake up for days with 0.25mg .    MDM   Final diagnoses:  Fall from wheelchair, initial encounter  Facial abrasion, initial encounter   Afebrile nontoxic patient with dementia, living in memory care unit at [facility] brought  in by EMS after witnessed fall from wheelchair.  Pt fell onto his face.  Staff denied LOC.  Pt with advanced dementia, unable to contribute much to the history, does not appear to be in face.  Has obvious facial abrasions and contusions.    Pt also seen and examined by Dr Manus Gunning.  Given second visit for fall in two days, ordered labs, CT scans, UA, CXR/pelvic xrays. Son came to ED, states patient is at baseline or better actually.  Pacemaker is function well, interrogated without abnormality.  Pending CT, labs, UA at change of shift.  Signed out to Danelle Berry, PA-C, who assumes care of patient.     Trixie Dredge, PA-C 04/28/15 2019  Glynn Octave, MD 04/29/15 [phone]

## 2015-04-28 NOTE — ED Notes (Signed)
PTAR CALLED @ 2251.

## 2015-04-28 NOTE — ED Notes (Signed)
Pt arrives today with c/o fall. Pt had a witnessed fall from his wheelchair onto his face while attempting to lean over. No LOC or blood thinners. Pt is a&o x1 at baseline d/t dementia.

## 2015-04-28 NOTE — ED Notes (Signed)
[facility] called to escort pt back to [facility].

## 2015-04-28 NOTE — Discharge Instructions (Signed)
Fall Prevention in Hospitals, Adult As a hospital patient, your condition and the treatments you receive can increase your risk for falls. Some additional risk factors for falls in a hospital include:  Being in an unfamiliar environment.  Being on bed rest.  Your surgery.  Taking certain medicines.  Your tubing requirements, such as intravenous (IV) therapy or catheters. It is important that you learn how to decrease fall risks while at the hospital. Below are important tips that can help prevent falls. SAFETY TIPS FOR PREVENTING FALLS Talk about your risk of falling.  Ask your health care provider why you are at risk for falling. Is it your medicine, illness, tubing placement, or something else?  Make a plan with your health care provider to keep you safe from falls.  Ask your health care provider or pharmacist about side effects of your medicines. Some medicines can make you dizzy or affect your coordination. Ask for help.  Ask for help before getting out of bed. You may need to press your call button.  Ask for assistance in getting safely to the toilet.  Ask for a walker or cane to be put at your bedside. Ask that most of the side rails on your bed be placed up before your health care provider leaves the room.  Ask family or friends to sit with you.  Ask for things that are out of your reach, such as your glasses, hearing aids, telephone, bedside table, or call button. Follow these tips to avoid falling:  Stay lying or seated, rather than standing, while waiting for help.  Wear rubber-soled slippers or shoes whenever you walk in the hospital.  Avoid quick, sudden movements.  Change positions slowly.  Sit on the side of your bed before standing.  Stand up slowly and wait before you start to walk.  Let your health care provider know if there is a spill on the floor.  Pay careful attention to the medical equipment, electrical cords, and tubes around you.  When you  need help, use your call button by your bed or in the bathroom. Wait for one of your health care providers to help you.  If you feel dizzy or unsure of your footing, return to bed and wait for assistance.  Avoid being distracted by the TV, telephone, or another person in your room.  Do not lean or support yourself on rolling objects, such as IV poles or bedside tables.   This information is not intended to replace advice given to you by your health care provider. Make sure you discuss any questions you have with your health care provider.   Document Released: 02/24/2000 Document Revised: 03/19/2014 Document Reviewed: 11/04/2011 Elsevier Interactive Patient Education 2016 Else[employer] Inc[employer]  Abrasion An abrasion is a cut or scrape on the outer surface of your skin. An abrasion does not extend through all of the layers of your skin. It is important to care for your abrasion properly to prevent infection. CAUSES Most abrasions are caused by falling on or gliding across the ground or another surface. When your skin rubs on something, the outer and inner layer of skin rubs off.  SYMPTOMS A cut or scrape is the main symptom of this condition. The scrape may be bleeding, or it may appear red or pink. If there was an associated fall, there may be an underlying bruise. DIAGNOSIS An abrasion is diagnosed with a physical exam. TREATMENT Treatment for this condition depends on how large and deep the abrasion is. Usually,  your abrasion will be cleaned with water and mild soap. This removes any dirt or debris that may be stuck. An antibiotic ointment may be applied to the abrasion to help prevent infection. A bandage (dressing) may be placed on the abrasion to keep it clean. You may also need a tetanus shot. HOME CARE INSTRUCTIONS Medicines  Take or apply medicines only as directed by your health care provider.  If you were prescribed an antibiotic ointment, finish all of it even if you start to feel  better. Wound Care  Clean the wound with mild soap and water 2-3 times per day or as directed by your health care provider. Pat your wound dry with a clean towel. Do not rub it.  There are many different ways to close and cover a wound. Follow instructions from your health care provider about:  Wound care.  Dressing changes and removal.  Check your wound every day for signs of infection. Watch for:  Redness, swelling, or pain.  Fluid, blood, or pus. General Instructions  Keep the dressing dry as directed by your health care provider. Do not take baths, swim, use a hot tub, or do anything that would put your wound underwater until your health care provider approves.  If there is swelling, raise (elevate) the injured area above the level of your heart while you are sitting or lying down.  Keep all follow-up visits as directed by your health care provider. This is important. SEEK MEDICAL CARE IF:  You received a tetanus shot and you have swelling, severe pain, redness, or bleeding at the injection site.  Your pain is not controlled with medicine.  You have increased redness, swelling, or pain at the site of your wound. SEEK IMMEDIATE MEDICAL CARE IF:  You have a red streak going away from your wound.  You have a fever.  You have fluid, blood, or pus coming from your wound.  You notice a bad smell coming from your wound or your dressing.   This information is not intended to replace advice given to you by your health care provider. Make sure you discuss any questions you have with your health care provider.   Document Released: 12/06/2004 Document Revised: 11/17/2014 Document Reviewed: 02/24/2014 Elsevier Interactive Patient Education 2016 Elsevier Inc.  Wound Care Taking care of your wound properly can help to prevent pain and infection. It can also help your wound to heal more quickly.  HOW TO CARE FOR YOUR WOUND  Take or apply over-the-counter and prescription  medicines only as told by your health care provider.  If you were prescribed antibiotic medicine, take or apply it as told by your health care provider. Do not stop using the antibiotic even if your condition improves.  Clean the wound each day or as told by your health care provider.  Wash the wound with mild soap and water.  Rinse the wound with water to remove all soap.  Pat the wound dry with a clean towel. Do not rub it.  There are many different ways to close and cover a wound. For example, a wound can be covered with stitches (sutures), skin glue, or adhesive strips. Follow instructions from your health care provider about:  How to take care of your wound.  When and how you should change your bandage (dressing).  When you should remove your dressing.  Removing whatever was used to close your wound.  Check your wound every day for signs of infection. Watch for:  Redness, swelling, or  pain.  Fluid, blood, or pus.  Keep the dressing dry until your health care provider says it can be removed. Do not take baths, swim, use a hot tub, or do anything that would put your wound underwater until your health care provider approves.  Raise (elevate) the injured area above the level of your heart while you are sitting or lying down.  Do not scratch or pick at the wound.  Keep all follow-up visits as told by your health care provider. This is important. SEEK MEDICAL CARE IF:  You received a tetanus shot and you have swelling, severe pain, redness, or bleeding at the injection site.  You have a fever.  Your pain is not controlled with medicine.  You have increased redness, swelling, or pain at the site of your wound.  You have fluid, blood, or pus coming from your wound.  You notice a bad smell coming from your wound or your dressing. SEEK IMMEDIATE MEDICAL CARE IF:  You have a red streak going away from your wound.   This information is not intended to replace advice given  to you by your health care provider. Make sure you discuss any questions you have with your health care provider.   Document Released: 12/06/2007 Document Revised: 07/13/2014 Document Reviewed: 02/22/2014 Elsevier Interactive Patient Education [employer]

## 2015-04-29 DIAGNOSIS — S0081XA Abrasion of other part of head, initial encounter: Secondary | ICD-10-CM | POA: Diagnosis not present

## 2015-05-03 ENCOUNTER — Telehealth: Payer: Self-pay | Admitting: Neurology

## 2015-05-03 NOTE — Telephone Encounter (Signed)
Enrique Sack from [facility] called in regards to PT and would like a call back/Dawn CB# [phone]

## 2015-05-03 NOTE — Telephone Encounter (Signed)
We can try low dose Abilify which is used for agitation.  Take abilify  daily as needed for agitation.  Need to be cautious using any antipsychotic medications in elderly patients as they can have paradoxical effects and sometimes worsen dementia.  Rare but potential side effects of all antipsychotics include increased risk of mortality and stroke.  The only way to prevent his falls is limiting his walking, as we have discussed at previous visits.   Lada Fulbright K. Allena Katz, DO

## 2015-05-03 NOTE — Telephone Encounter (Signed)
Enrique Sack from [facility] called to let us know that patient fell on his face x 2 last week.  Went to ER but nothing broken.  Enrique Sack thinks he is fidgety and just can't be still.  She thinks he may have some anxiety.  You gave him ativan q 12 hours but son did not want him to have it because he wouldn't respond to med well.  He does have a companion coming in every day from 1 until 4 to sit with him.  The son just wanted you to be aware and is open to any suggestions.

## 2015-05-04 NOTE — Telephone Encounter (Signed)
Left message for Charles Lambert to call me back if they would like to try the abilify.

## 2015-05-05 ENCOUNTER — Telehealth: Payer: Self-pay | Admitting: Neurology

## 2015-05-05 NOTE — Telephone Encounter (Signed)
[facility] called and said yes they would like to try Abilify, please call with order.

## 2015-05-06 ENCOUNTER — Telehealth: Payer: Self-pay | Admitting: *Deleted

## 2015-05-06 ENCOUNTER — Other Ambulatory Visit: Payer: Self-pay | Admitting: *Deleted

## 2015-05-06 MED ORDER — ARIPIPRAZOLE 5 MG PO TABS: 5.0000 mg | ORAL_TABLET | Freq: Every day | ORAL | Status: DC

## 2015-05-06 NOTE — Telephone Encounter (Signed)
Noted  

## 2015-05-06 NOTE — Progress Notes (Signed)
Remote pacemaker transmission.   

## 2015-05-06 NOTE — Telephone Encounter (Signed)
Called Charles Lambert and informed her that I will fax over Rx for Abilify per Dr. Allena Katz.

## 2015-05-06 NOTE — Telephone Encounter (Signed)
Malachi Bonds, NP with [facility] called to let us know that they are going to decrease patient's zoloft to 25 mg x 7 days then d/c it since  he was only supposed to be on this after his wife passed away.  The son wants to hold off on the Abilify for now until they see if taking him off zoloft helps.  Tomi's cell: [phone]

## 2015-05-06 NOTE — Telephone Encounter (Signed)
Rx faxed

## 2015-05-06 NOTE — Telephone Encounter (Signed)
-----   Message from [facility] sent at 05/04/2015  2:45 PM EST ----- Enrique Sack from [facility] called and would like a call back at [phone]/Dawn

## 2015-05-12 ENCOUNTER — Encounter: Payer: Self-pay | Admitting: Internal Medicine

## 2015-05-12 ENCOUNTER — Ambulatory Visit (INDEPENDENT_AMBULATORY_CARE_PROVIDER_SITE_OTHER): Payer: Medicare Other | Admitting: Internal Medicine

## 2015-05-12 VITALS — BP 138/82 | HR 60 | Ht 70.0 in | Wt 167.0 lb

## 2015-05-12 DIAGNOSIS — I495 Sick sinus syndrome: Secondary | ICD-10-CM

## 2015-05-12 LAB — CUP PACEART INCLINIC DEVICE CHECK
Battery Remaining Longevity: 82.8
Battery Voltage: 2.92 V
Brady Statistic RA Percent Paced: 81 %
Brady Statistic RV Percent Paced: 16 %
Implantable Lead Implant Date: 20110421
Implantable Lead Location: 753859
Lead Channel Impedance Value: 350 Ohm
Lead Channel Pacing Threshold Amplitude: 1 V
Lead Channel Pacing Threshold Pulse Width: 0.4 ms
Lead Channel Pacing Threshold Pulse Width: 0.4 ms
Lead Channel Sensing Intrinsic Amplitude: 9.1 mV
Lead Channel Setting Pacing Amplitude: 2.5 V
MDC IDC LEAD IMPLANT DT: 20110421
MDC IDC LEAD LOCATION: 753860
MDC IDC MSMT LEADCHNL RA PACING THRESHOLD AMPLITUDE: 1 V
MDC IDC MSMT LEADCHNL RA PACING THRESHOLD PULSEWIDTH: 0.4 ms
MDC IDC MSMT LEADCHNL RA SENSING INTR AMPL: 1 mV
MDC IDC MSMT LEADCHNL RV IMPEDANCE VALUE: 387.5 Ohm
MDC IDC MSMT LEADCHNL RV PACING THRESHOLD AMPLITUDE: 1 V
MDC IDC MSMT LEADCHNL RV PACING THRESHOLD AMPLITUDE: 1 V
MDC IDC MSMT LEADCHNL RV PACING THRESHOLD PULSEWIDTH: 0.4 ms
MDC IDC PG SERIAL: 7135038
MDC IDC SESS DTM: 20170302154601
MDC IDC SET LEADCHNL RA PACING AMPLITUDE: 2 V
MDC IDC SET LEADCHNL RV PACING PULSEWIDTH: 0.4 ms
MDC IDC SET LEADCHNL RV SENSING SENSITIVITY: 2 mV

## 2015-05-12 NOTE — Patient Instructions (Signed)
Medication Instructions:  Your physician recommends that you continue on your current medications as directed. Please refer to the Current Medication list given to you today.    Labwork: None ordered   Testing/Procedures: None ordered   Follow-Up: Your physician wants you to follow-up in: 12 months with Dr Taylor You will receive a reminder letter in the mail two months in advance. If you don't receive a letter, please call our office to schedule the follow-up appointment.  Remote monitoring is used to monitor your Pacemaker  from home. This monitoring reduces the number of office visits required to check your device to one time per year. It allows us to keep an eye on the functioning of your device to ensure it is working properly. You are scheduled for a device check from home on 08/11/15. You may send your transmission at any time that day. If you have a wireless device, the transmission will be sent automatically. After your physician reviews your transmission, you will receive a postcard with your next transmission date.     Any Other Special Instructions Will Be Listed Below (If Applicable).     If you need a refill on your cardiac medications before your next appointment, please call your pharmacy.   

## 2015-05-12 NOTE — Progress Notes (Signed)
HPI [name] returns today for followup. He is a very pleasant 82 year old man with a history of cigarette bradycardia, subdural hematoma, hypertension, status post permanent pacemaker insertion. He also has atrial fibrillation/flutter. He is not a candidate for anti-coagulation because of his remote history of bleeding. In the interim, he has had a marked decline due to Parkinson's disease. He is non-ambulatory and minimally conversant. It is unclear whether he is able to understand but he has minimal words that he can express. His spasticity is severe.  Allergies  Allergen Reactions  . Ativan [Lorazepam] Other (See Comments)    Oversedation.  Son states "ativan almost killed him"      Current Outpatient Prescriptions  Medication Sig Dispense Refill  . carbidopa-levodopa (SINEMET IR) 25-100 MG tablet Take 2 tablets by mouth 4 (four) times daily. 540 tablet 3  . Cranberry 425 MG CAPS Take 425 mg by mouth 2 (two) times daily.    Marland Kitchen ENSURE (ENSURE) Take 237 mLs by mouth 3 (three) times daily between meals. *Vanilla Flavor*    . finasteride (PROSCAR) 5 MG tablet Take 5 mg by mouth daily.    . fludrocortisone (FLORINEF) 0.1 MG tablet Take 1 tablet (0.1 mg total) by mouth daily. (Patient taking differently: Take 0.1 mg by mouth 2 (two) times daily. )    . levETIRAcetam (KEPPRA) 250 MG tablet Take 250 mg by mouth 2 (two) times daily.     Marland Kitchen loratadine (CLARITIN) 10 MG tablet Take 10 mg by mouth daily.     . protein supplement (RESOURCE BENEPROTEIN) POWD Take 1 scoop by mouth 3 (three) times daily with meals.    . sertraline (ZOLOFT) 50 MG tablet Take 50 mg by mouth daily.    . vitamin B-12 (CYANOCOBALAMIN) 1000 MCG tablet Take 1,000 mcg by mouth daily.     No current facility-administered medications for this visit.     Past Medical History  Diagnosis Date  . Dementia   . Subdural hematoma (HCC)   . Prostate enlargement   . Pacemaker   . Sinoatrial node dysfunction (HCC)   . Pneumonia      ROS:   All systems reviewed and negative except as noted in the HPI.   Past Surgical History  Procedure Laterality Date  . Burr hole    . Knee arthroscopy Left      Family History  Problem Relation Age of Onset  . Hypertension Mother   . Hypertension Father   . Hyperlipidemia Son      Social History   Social History  . Marital Status: Married    Spouse Name: N/A  . Number of Children: N/A  . Years of Education: N/A   Occupational History  . Not on file.   Social History Main Topics  . Smoking status: Former Smoker    Types: Cigarettes    Quit date: 03/12/1968  . Smokeless tobacco: Not on file     Comment: Quit in 1970s  . Alcohol Use: Yes     Comment: Occasional beer   . Drug Use: No  . Sexual Activity: Not on file   Other Topics Concern  . Not on file   Social History Narrative   Lives in memory unit at extended care facility   Wife passed away in December 07, 2011.  They have three grown children.     BP 138/82 mmHg  Pulse 60  Ht  (1.778 m)  Wt 167 lb (75.751 kg)  BMI 23.96 kg/m2  Physical Exam:  Chronically ill appearing 82 year old man,NAD HEENT: Unremarkable, except for a healed left frontal temporal scar. Neck:  7 cm JVD, no thyromegally Lungs:  Clear except for rales in the bases, right greater than left. HEART:  Regular rate rhythm, no murmurs, no rubs, no clicks Abd:  soft, positive bowel sounds, no organomegally, no rebound, no guarding Ext:  2 plus pulses, no edema, no cyanosis, no clubbing Skin:  No rashes no nodules Neuro:  Unable to walk or to speak   DEVICE  Normal device function.  See PaceArt for details.   Assess/Plan: 1. Sinus node dysfunction - his [employer] DDD PM is working normally and still has over 6 yrs of longevity. Will follow. 2. Parkinson's disease - unfortunately he appears to be in the advanced phases of his illness.  3. HTN - his blood pressure is reasonably well controlled. Will follow.  Charles Lambert.D.

## 2015-05-19 ENCOUNTER — Ambulatory Visit (INDEPENDENT_AMBULATORY_CARE_PROVIDER_SITE_OTHER): Payer: Medicare Other | Admitting: Neurology

## 2015-05-19 ENCOUNTER — Encounter: Payer: Self-pay | Admitting: Neurology

## 2015-05-19 VITALS — BP 100/64 | HR 61 | Ht 70.0 in | Wt 162.4 lb

## 2015-05-19 DIAGNOSIS — F0280 Dementia in other diseases classified elsewhere without behavioral disturbance: Secondary | ICD-10-CM | POA: Diagnosis not present

## 2015-05-19 DIAGNOSIS — G3183 Dementia with Lewy bodies: Secondary | ICD-10-CM

## 2015-05-19 DIAGNOSIS — I951 Orthostatic hypotension: Secondary | ICD-10-CM | POA: Diagnosis not present

## 2015-05-19 DIAGNOSIS — G218 Other secondary parkinsonism: Secondary | ICD-10-CM

## 2015-05-19 NOTE — Patient Instructions (Signed)
Agree with Hospice Referral Discontinue sertraline Continue remaining medications Return to clinic in 4 months

## 2015-05-19 NOTE — Progress Notes (Signed)
Follow-up Visit   Date: 05/19/2015    Charles Lambert MRN: [id number] DOB: Jun 08, 1927   Interim History: Charles Lambert is a 82 y.o. right-handed Caucasian of [place] ancestry male with history of SDH s/p evacuation (2008), GERD, atrial fibrillation s/p PPM (not on anticoagulation because of history of bleeding and falls) and vitamin B12 deficiency returning to the clinic for Lewy body dementia.   He is accompanied by his son and caregiver who provides history.  History of present illness: He was admitted from 10/13-10/20/2014 with possible meningitis after presenting with unresponsiveness and fever (102F). CSF analysis shows R1 W1 G57 P157 which was performed two days after starting antibiotics. CSF cultures remained negative. Hospital course was notable for fluctuating mental status and agitation. He apparently had prolonged somnolence after receiving ativan. Eventually, he slowly improved and was discharged in stable condition to a nursing facility where he completed his course of IV antibiotics (vancomycin and ceftriaxone). He had another unresponsive spell while getting his PICC line removed, so was readmitted for evaluation (10/29 - 01/15/2013). During his hospitalization, his EEG showed a single sharp wave over the left frontal region for which he was started on Keppra  BID.   Further history discloses 2-year history of fluctuating cognition with excessive daytime somnolence. He can sleep for up to 4-6 hours during the daytime and can fall asleep easily during conversation or watching tv. He has episodes of alertness, where he is interactive and engages in conversation. He is a social person and enjoys the company of others.  He also has frequent limb movements at night, intermittent jerks during the day, visual hallucination, and constipation. His mental status continues to fluctuate and he has had one episode of prolonged unresponsiveness. Per son, when staff tried to arouse him,  he was able to move his arms and legs, but was not alert. He is slow to recover from these spells, taking up to several hours.  His gait has been unstable and he had 5 falls over the past year, no significant injuries.   In January 2015, the diagnosis of Lewy body dementia was discussed and sinemet was started. After starting sinemet 25/100mg  TID, family and caregivers at facility have noted that he is more active, engages in conversation and appears slightly more stable on his feet. He does, however, continue to have frequent falls. Sleep improved.  By the end of 2015, he was still alert and walking independently, using a walker only occasionally, but still having a few falls.  In 2016, there was an increase in the number of falls and during one week alone, he suffered four falls.  Patient started to become more demented and sedated so his exelon and namenda were discontinued.  He is also having swallowing difficulty managed conservatively.  Family does not want PEG and have Advanced Directive in place (DNR).    - UPDATE 05/19/2015:  Patient continues to have frequent falls and has two ED visits two nights in a row.  He suffers superficial abrasions and contusions.  He denies any headache or pain.  He arrived in a wheelchair today with a caregiver that his family hired for several hours per day.  His alertness waxes and wanes throughout the day.  He is able to feed himself but requires assistance for all other ADLs.  He does hallucinate and often tries to reach for things.  Son would like to try to minimize all nonessential medications which is reasonable.  They are also contemplating whether  he would be appropriate for Hospice care.    Medications:  Current Outpatient Prescriptions on File Prior to Visit  Medication Sig Dispense Refill  . carbidopa-levodopa (SINEMET IR) 25-100 MG tablet Take 2 tablets by mouth 4 (four) times daily. 540 tablet 3  . Cranberry 425 MG CAPS Take 425 mg by mouth 2 (two) times  daily.    Marland Kitchen. ENSURE (ENSURE) Take 237 mLs by mouth 3 (three) times daily between meals. *Vanilla Flavor*    . finasteride (PROSCAR) 5 MG tablet Take 5 mg by mouth daily.    . fludrocortisone (FLORINEF) 0.1 MG tablet Take 1 tablet (0.1 mg total) by mouth daily. (Patient taking differently: Take 0.1 mg by mouth 2 (two) times daily. )    . levETIRAcetam (KEPPRA) 250 MG tablet Take 250 mg by mouth 2 (two) times daily.     Marland Kitchen. loratadine (CLARITIN) 10 MG tablet Take 10 mg by mouth daily.     . protein supplement (RESOURCE BENEPROTEIN) POWD Take 1 scoop by mouth 3 (three) times daily with meals.    . vitamin B-12 (CYANOCOBALAMIN) 1000 MCG tablet Take 1,000 mcg by mouth daily.     No current facility-administered medications on file prior to visit.    Allergies:  Allergies  Allergen Reactions  . Ativan [Lorazepam] Other (See Comments)    Oversedation.  Son states "ativan almost killed him"      Review of Systems:  CONSTITUTIONAL: No fevers, chills, night sweats, or weight loss.   EYES: No visual changes or eye pain ENT: No hearing changes.  No history of nose bleeds.   RESPIRATORY: +cough, wheezing and shortness of breath.   CARDIOVASCULAR: Negative for chest pain, and palpitations.   GI: Negative for abdominal discomfort, blood in stools or black stools.  No recent change in bowel habits.   GU:  + incontinence.   MUSCLOSKELETAL: No history of joint pain or swelling.  No myalgias.   SKIN: +lesions, rash, and itching.   ENDOCRINE: Negative for cold or heat intolerance, polydipsia or goiter.   PSYCH:  No depression or anxiety symptoms.   NEURO: As Above.   Vital Signs:  BP 100/64 mmHg  Pulse 61  Ht 5\' 10"  (1.778 m)  Wt 162 lb 7 oz (73.681 kg)  BMI 23.31 kg/m2  SpO2 98%  Neurological Exam: MENTAL STATUS including orientation to person. He is awake and with multiple prompts, will follow simple commands. Very poor spontaneous speech.  Speech is severely hypophonic with very little speech  production. His affect is severely blunted.  CRANIAL NERVES:  Pupils are pin point and reactive to light.  Face is symmetric.   MOTOR:  Motor strength is 5/5 throughout. Moderate cogwheel rigidity of the upper extremities (R >L).  Mild resting hand tremor bilaterally.    COORDINATION/GAIT: Bradykinesia throughout.  He arrived in wheelchair, so gait was not tested.   Data: Labs 02/10/2013:  Alpha-tocopherol 13.1, vitamin E 1.3, RPR NR, MMA 0.24, vitamin B12 298 CT head 01/07/2013: No acute abnormality   EEG 01/08/2013  EEG Abnormalities:  1) single left frontal sharp wave  Clinical Interpretation: This EEG is suggestive of an area of potential epileptogenicity in the left frontal region. There is also evidence of a mild nonspecific encephalopathy. There was no seizure recorded on this study.   CSF 12/25/2012: W0 R1 G57 P157    CSF HSV, cultures - neg  CT HEAD: No acute intracranial process. Large RIGHT frontal scalp hematoma without skull fracture. Remote LEFT burr holes.  Involutional changes. Moderate white matter changes may reflect chronic small vessel ischemic disease.  CT CERVICAL SPINE: Straightened cervical lordosis without acute fracture. Grade 1 C7-T1 anterolisthesis on degenerative basis.  IMPRESSION/PLAN: 1.  Lewy body Dementia, worsening as expected as the natural course of the disease.  Lengthy conversation with son discussing that based on the advanced stage of his disease process, Hospice care is appropriate. His fall risk is HIGH and he will most likely succumb to a devastating fall.  He does not have the insight to remain in a chair as this will be his best fall precaution.   Although there are periods of agitation, these are best managed with nonpharmacologic manners.  I do not recommend antipsychotic medications, if it can be avoided.  For his parkinsonism, continue sinemet 25/100mg  to 2 tablets four times daily - 8am, noon, 4pm, and 8pm. For orthostatic intolerance,   continue knee-high stockings and continue florinef 0.1mg  twice daily  2.  History of unresponsive spells, improved.  Some of these spells may be related to unresponsiveness that can be seen with Lewy body dementia, since the duration of unresponsiveness is much longer than would be expected from seizures, but he has remained event free on keppra  BID.  3.  History of encephalopathy, empirically treated for meningitis (12/2012)    4.  History of SDH s/p evacuation   5.  Atrial fibrillation s/p PPM, not on anticoagulation due to risk of falls and history of GI bleed  6.  Depression following the loss of his wife.  He was on sertraline for a number of years, it is reasonable to discontinue this now.   Return to clinic in 4 months   The duration of this appointment visit was 30 minutes of face-to-face time with the patient.  Greater than 50% of this time was spent in counseling, explanation of diagnosis, planning of further management, and coordination of care.   Thank you for allowing me to participate in patient's care.  If I can answer any additional questions, I would be pleased to do so.    Sincerely,    Cereniti Curb K. Allena Katz, DO

## 2015-07-12 ENCOUNTER — Emergency Department (HOSPITAL_COMMUNITY)

## 2015-07-12 ENCOUNTER — Encounter (HOSPITAL_COMMUNITY): Payer: Self-pay | Admitting: Emergency Medicine

## 2015-07-12 ENCOUNTER — Emergency Department (HOSPITAL_COMMUNITY)
Admission: EM | Admit: 2015-07-12 | Discharge: 2015-07-12 | Disposition: A | Discharge status: Home / Self Care | Attending: Emergency Medicine | Admitting: Emergency Medicine

## 2015-07-12 DIAGNOSIS — N400 Enlarged prostate without lower urinary tract symptoms: Secondary | ICD-10-CM | POA: Diagnosis not present

## 2015-07-12 DIAGNOSIS — Z950 Presence of cardiac pacemaker: Secondary | ICD-10-CM | POA: Insufficient documentation

## 2015-07-12 DIAGNOSIS — R4182 Altered mental status, unspecified: Secondary | ICD-10-CM | POA: Diagnosis present

## 2015-07-12 DIAGNOSIS — F0390 Unspecified dementia without behavioral disturbance: Secondary | ICD-10-CM | POA: Insufficient documentation

## 2015-07-12 DIAGNOSIS — Z79899 Other long term (current) drug therapy: Secondary | ICD-10-CM | POA: Insufficient documentation

## 2015-07-12 DIAGNOSIS — Z8679 Personal history of other diseases of the circulatory system: Secondary | ICD-10-CM | POA: Insufficient documentation

## 2015-07-12 DIAGNOSIS — Z8701 Personal history of pneumonia (recurrent): Secondary | ICD-10-CM | POA: Insufficient documentation

## 2015-07-12 DIAGNOSIS — G40909 Epilepsy, unspecified, not intractable, without status epilepticus: Secondary | ICD-10-CM | POA: Insufficient documentation

## 2015-07-12 DIAGNOSIS — Z87891 Personal history of nicotine dependence: Secondary | ICD-10-CM | POA: Diagnosis not present

## 2015-07-12 LAB — COMPREHENSIVE METABOLIC PANEL
ALT: 19 U/L (ref 17–63)
AST: 20 U/L (ref 15–41)
Albumin: 3.3 g/dL — ABNORMAL LOW (ref 3.5–5.0)
Alkaline Phosphatase: 49 U/L (ref 38–126)
Anion gap: 10 (ref 5–15)
BUN: 27 mg/dL — ABNORMAL HIGH (ref 6–20)
CO2: 29 mmol/L (ref 22–32)
Calcium: 9.3 mg/dL (ref 8.9–10.3)
Chloride: 104 mmol/L (ref 101–111)
Creatinine, Ser: 1.11 mg/dL (ref 0.61–1.24)
GFR calc Af Amer: >60 mL/min (ref >60)
GFR calc non Af Amer: 59 mL/min — ABNORMAL LOW (ref >60)
Glucose, Bld: 101 mg/dL — ABNORMAL HIGH (ref 65–99)
Potassium: 4 mmol/L (ref 3.5–5.1)
Sodium: 143 mmol/L (ref 135–145)
Total Bilirubin: 1.1 mg/dL (ref 0.3–1.2)
Total Protein: 6.4 g/dL — ABNORMAL LOW (ref 6.5–8.1)

## 2015-07-12 LAB — URINE MICROSCOPIC-ADD ON

## 2015-07-12 LAB — URINALYSIS, ROUTINE W REFLEX MICROSCOPIC
Bilirubin Urine: NEGATIVE
Glucose, UA: NEGATIVE mg/dL
Ketones, ur: NEGATIVE mg/dL
Leukocytes, UA: NEGATIVE
Nitrite: NEGATIVE
Protein, ur: NEGATIVE mg/dL
Specific Gravity, Urine: 1.025 (ref 1.005–1.030)
pH: 6 (ref 5.0–8.0)

## 2015-07-12 LAB — CBC WITH DIFFERENTIAL/PLATELET
Basophils Absolute: 0 10*3/uL (ref 0.0–0.1)
Basophils Relative: 0 %
Eosinophils Absolute: 0.1 10*3/uL (ref 0.0–0.7)
Eosinophils Relative: 1 %
HCT: 35.1 % — ABNORMAL LOW (ref 39.0–52.0)
Hemoglobin: 11.4 g/dL — ABNORMAL LOW (ref 13.0–17.0)
Lymphocytes Relative: 15 %
Lymphs Abs: 1.2 10*3/uL (ref 0.7–4.0)
MCH: 29.1 pg (ref 26.0–34.0)
MCHC: 32.5 g/dL (ref 30.0–36.0)
MCV: 89.5 fL (ref 78.0–100.0)
Monocytes Absolute: 0.5 10*3/uL (ref 0.1–1.0)
Monocytes Relative: 6 %
Neutro Abs: 6.4 10*3/uL (ref 1.7–7.7)
Neutrophils Relative %: 78 %
Platelets: 244 10*3/uL (ref 150–400)
RBC: 3.92 MIL/uL — ABNORMAL LOW (ref 4.22–5.81)
RDW: 14.2 % (ref 11.5–15.5)
WBC: 8.2 10*3/uL (ref 4.0–10.5)

## 2015-07-12 LAB — I-STAT CG4 LACTIC ACID, ED: Lactic Acid, Venous: 1.13 mmol/L (ref 0.5–2.0)

## 2015-07-12 MED ORDER — LEVETIRACETAM 500 MG/5ML IV SOLN
500.0000 mg | Freq: Once | INTRAVENOUS | Status: AC
Start: 2015-07-12 — End: 2015-07-12
  Administered 2015-07-12: 500 mg via INTRAVENOUS
  Filled 2015-07-12: qty 5

## 2015-07-12 MED ORDER — LEVETIRACETAM 500 MG PO TABS: 500.0000 mg | ORAL_TABLET | Freq: Two times a day (BID) | ORAL | Status: AC

## 2015-07-12 MED ORDER — DEXTROSE-NACL 5-0.45 % IV SOLN
INTRAVENOUS | Status: DC
  Administered 2015-07-12: 19:00:00 via INTRAVENOUS

## 2015-07-12 NOTE — ED Notes (Signed)
[facility] called for transportation  

## 2015-07-12 NOTE — Discharge Instructions (Signed)
Recommend skilled nursing facility   Increased Keppra from 250, twice a day, to 500 mg twice a day.   Return to ER with any acute changes.

## 2015-07-12 NOTE — ED Notes (Signed)
Dr. James MD at bedside. 

## 2015-07-12 NOTE — ED Provider Notes (Signed)
CSN: [id number]     Arrival date & time 07/12/15  1248 History   First MD Initiated Contact with Patient 07/12/15 1259     Chief Complaint  Patient presents with  . Altered Mental Status      HPI  Patient presents for evaluation from [facility] nursing care.  She has a history of a seizure disorder. He arrives here after being found less responsive by staff. Allegedly may have had a seizure this morning. Upon arrival decided our facility he states that his father is improving markedly hand within an hour's time feels he is at his baseline. Like evaluated because he states at times he will get infections or "other problems" that we'll change his activity. He is getting hospice care. He is getting a sedimentation rate hours per day. He is in assisted living. Son feels that his level of care as appropriate for his current situation. No falls or injuries or trauma. No recent change in medications other than 6 weeks ago was Keppra was decreased from 500 twice a day, do 250 twice a day.  Past Medical History  Diagnosis Date  . Dementia   . Subdural hematoma (HCC)   . Prostate enlargement   . Pacemaker   . Sinoatrial node dysfunction (HCC)   . Pneumonia    Past Surgical History  Procedure Laterality Date  . Burr hole    . Knee arthroscopy Left    Family History  Problem Relation Age of Onset  . Hypertension Mother   . Hypertension Father   . Hyperlipidemia Son    Social History  Substance Use Topics  . Smoking status: Former Smoker    Types: Cigarettes    Quit date: 03/12/1968  . Smokeless tobacco: None     Comment: Quit in 1970s  . Alcohol Use: No     Comment: Occasional beer     Review of Systems  Unable to perform ROS: Dementia      Allergies  Ativan  Home Medications   Prior to Admission medications   Medication Sig Start Date End Date Taking? Authorizing Provider  atropine 1 % ophthalmic solution Place 4 drops under the tongue every 4 (four) hours as needed  (tracheal secretions).   Yes Historical Provider, MD  carbidopa-levodopa (SINEMET IR) 25-100 MG tablet Take 2 tablets by mouth 4 (four) times daily. 01/26/15  Yes Donika K Patel, DO  Cranberry 425 MG CAPS Take 425 mg by mouth 2 (two) times daily.   Yes Historical Provider, MD  ENSURE (ENSURE) Take 237 mLs by mouth 3 (three) times daily between meals. *Vanilla Flavor*   Yes Historical Provider, MD  fludrocortisone (FLORINEF) 0.1 MG tablet Take 1 tablet (0.1 mg total) by mouth daily. Patient taking differently: Take 0.1 mg by mouth 2 (two) times daily.  01/26/15  Yes Donika K Patel, DO  loratadine (CLARITIN) 10 MG tablet Take 10 mg by mouth daily.    Yes Historical Provider, MD  protein supplement (RESOURCE BENEPROTEIN) POWD Take 1 scoop by mouth 3 (three) times daily with meals. 04/27/15  Yes Historical Provider, MD  sertraline (ZOLOFT) 25 MG tablet Take 25 mg by mouth daily.   Yes Historical Provider, MD  vitamin B-12 (CYANOCOBALAMIN) 1000 MCG tablet Take 1,000 mcg by mouth daily.   Yes Historical Provider, MD  levETIRAcetam (KEPPRA) 500 MG tablet Take 1 tablet (500 mg total) by mouth 2 (two) times daily. 07/12/15   Rolland Porter, MD   BP 156/80 mmHg  Pulse 62  Temp(Src) 98 F (36.7 C) (Rectal)  Resp 19  Ht  (1.778 m)  Wt 160 lb (72.576 kg)  BMI 22.96 kg/m2  SpO2 99% Physical Exam  Constitutional: He is oriented to person, place, and time. He appears well-developed and well-nourished. No distress.  HENT:  Head: Normocephalic.  Eyes: Conjunctivae are normal. Pupils are equal, round, and reactive to light. No scleral icterus.  Neck: Normal range of motion. Neck supple. No thyromegaly present.  Cardiovascular: Normal rate and regular rhythm.  Exam reveals no gallop and no friction rub.   No murmur heard. Pulmonary/Chest: Effort normal and breath sounds normal. No respiratory distress. He has no wheezes. He has no rales.  Abdominal: Soft. Bowel sounds are normal. He exhibits no distension.  There is no tenderness. There is no rebound.  Musculoskeletal: Normal range of motion.  Neurological: He is alert and oriented to person, place, and time.  Speaking one or 2 words at a time. Pleasant and calm only demented. Nonfocal.  Skin: Skin is warm and dry. No rash noted.  Psychiatric: He has a normal mood and affect. His behavior is normal.    ED Course  Procedures (including critical care time) Labs Review Labs Reviewed  COMPREHENSIVE METABOLIC PANEL - Abnormal; Notable for the following:    Glucose, Bld 101 (*)    BUN 27 (*)    Total Protein 6.4 (*)    Albumin 3.3 (*)    GFR calc non Af Amer 59 (*)    All other components within normal limits  CBC WITH DIFFERENTIAL/PLATELET - Abnormal; Notable for the following:    RBC 3.92 (*)    Hemoglobin 11.4 (*)    HCT 35.1 (*)    All other components within normal limits  URINALYSIS, ROUTINE W REFLEX MICROSCOPIC (NOT AT [facility]) - Abnormal; Notable for the following:    Hgb urine dipstick TRACE (*)    All other components within normal limits  URINE MICROSCOPIC-ADD ON - Abnormal; Notable for the following:    Squamous Epithelial / LPF 0-5 (*)    Bacteria, UA RARE (*)    All other components within normal limits  URINE CULTURE  I-STAT CG4 LACTIC ACID, ED    Imaging Review Ct Head Wo Contrast  07/12/2015  CLINICAL DATA:  Altered mental status. Last seen normal late last night. History of dementia and subdural hematoma. EXAM: CT HEAD WITHOUT CONTRAST TECHNIQUE: Contiguous axial images were obtained from the base of the skull through the vertex without intravenous contrast. COMPARISON:  04/28/2015 FINDINGS: The ventricles are normal configuration. There is ventricular and sulcal enlargement reflecting moderate atrophy. No convincing hydrocephalus. There are no parenchymal masses or mass effect. There is no evidence of a recent cortical infarct. White matter hypoattenuation is noted consistent moderate chronic microvascular ischemic change.  There are no extra-axial masses or abnormal fluid collections. There is no intracranial hemorrhage. There are changes from left posterior frontal and parietal burr holes. Several left mastoid air cells are opacified with fluid attenuation. Clear right mastoid air cells. Clear visualized sinuses. IMPRESSION: 1. No acute intracranial abnormalities. 2. Moderate atrophy and chronic microvascular ischemic change. Electronically Signed   By: Amie Portland M.D.   On: 07/12/2015 15:17   Dg Chest Port 1 View  07/12/2015  CLINICAL DATA:  Cough. EXAM: PORTABLE CHEST 1 VIEW COMPARISON:  04/28/2015 FINDINGS: Cardiac silhouette is normal in size. No mediastinal or hilar masses or evidence of adenopathy. Lungs show mild prominence of the bronchovascular markings. Stable scarring or  chronic subsegmental atelectasis at the right lung base. No convincing pneumonia. No pulmonary edema. No obvious pleural effusion and no pneumothorax. Left anterior chest wall pacemaker is stable. IMPRESSION: No acute cardiopulmonary disease. Electronically Signed   By: Amie Portlandavid  Ormond M.D.   On: 07/12/2015 13:54   I have personally reviewed and evaluated these images and lab results as part of my medical decision-making.   EKG Interpretation   Date/Time:  Tuesday Jul 12 2015 12:58:54 EDT Ventricular Rate:  60 PR Interval:    QRS Duration: 93 QT Interval:  427 QTC Calculation: 427 R Axis:   90 Text Interpretation:  artifact makes rhythm interpretation difficult  Borderline right axis deviation Borderline repolarization abnormality  Minimal ST elevation, lateral leads Artifact in lead(s) I II III aVR aVL  aVF V1 V5 V6 Confirmed by PICKERING  MD, NATHAN [phone]([id number]) on 07/12/2015  1:08:18 PM      MDM   Final diagnoses:  Seizure disorder (HCC)    No acute findings noted. Remains him medically stable. Given evening dose of 500 twice a day Keppra. We'll increase him back to his previous 500 twice a day. ER with any changes.    Rolland PorterMark  Linea Calles, MD 07/12/15 2018

## 2015-07-12 NOTE — ED Notes (Signed)
Pharmacy contacted for Keppra

## 2015-07-12 NOTE — ED Notes (Signed)
[facility] CALLED @ 2159. 

## 2015-07-12 NOTE — ED Notes (Signed)
Pt arrives [facility].  EMS reports pt found to be altered today per staff, LSN last night aprox 2200.  Pt's son at bedside, reports pt more responsive at this time than earlier today but not at baseline.  Pt nonverbal at baseline.  Pt does not follow commands.  Movements equal bilat, no focal deficits noted. Cough noted.

## 2015-07-12 NOTE — ED Provider Notes (Signed)
CSN: [id number]     Arrival date & time 07/12/15  1248 History   First MD Initiated Contact with Patient 07/12/15 1259     Chief Complaint  Patient presents with  . Altered Mental Status     (Consider location/radiation/quality/duration/timing/severity/associated sxs/prior Treatment) HPI Patient presents to the emergency department with altered mental status.  The patient lives in a nursing facility and has dementia.  He is unable to give me any history the son states that the patient seemed more lethargic than normal.  The staff at the nursing facility called the son about the patient seemingly less responsive and was no seizure activity noted by the staff.  Son states that at this time the patient seems more alert than previous.  There was no reported vomiting, fevers, or syncope. Past Medical History  Diagnosis Date  . Dementia   . Subdural hematoma (HCC)   . Prostate enlargement   . Pacemaker   . Sinoatrial node dysfunction (HCC)   . Pneumonia    Past Surgical History  Procedure Laterality Date  . Burr hole    . Knee arthroscopy Left    Family History  Problem Relation Age of Onset  . Hypertension Mother   . Hypertension Father   . Hyperlipidemia Son    Social History  Substance Use Topics  . Smoking status: Former Smoker    Types: Cigarettes    Quit date: 03/12/1968  . Smokeless tobacco: None     Comment: Quit in 1970s  . Alcohol Use: No     Comment: Occasional beer     Review of Systems  Level V caveat applies due to dementia  Allergies  Ativan  Home Medications   Prior to Admission medications   Medication Sig Start Date End Date Taking? Authorizing Provider  carbidopa-levodopa (SINEMET IR) 25-100 MG tablet Take 2 tablets by mouth 4 (four) times daily. 01/26/15   Donika K Patel, DO  Cranberry 425 MG CAPS Take 425 mg by mouth 2 (two) times daily.    Historical Provider, MD  ENSURE (ENSURE) Take 237 mLs by mouth 3 (three) times daily between meals. *Vanilla  Flavor*    Historical Provider, MD  finasteride (PROSCAR) 5 MG tablet Take 5 mg by mouth daily.    Historical Provider, MD  fludrocortisone (FLORINEF) 0.1 MG tablet Take 1 tablet (0.1 mg total) by mouth daily. Patient taking differently: Take 0.1 mg by mouth 2 (two) times daily.  01/26/15   Donika Concha SeK Patel, DO  levETIRAcetam (KEPPRA) 250 MG tablet Take 250 mg by mouth 2 (two) times daily.  01/15/13   Maryann Mikhail, DO  loratadine (CLARITIN) 10 MG tablet Take 10 mg by mouth daily.     Historical Provider, MD  protein supplement (RESOURCE BENEPROTEIN) POWD Take 1 scoop by mouth 3 (three) times daily with meals. 04/27/15   Historical Provider, MD  vitamin B-12 (CYANOCOBALAMIN) 1000 MCG tablet Take 1,000 mcg by mouth daily.    Historical Provider, MD   BP 158/72 mmHg  Pulse 59  Temp(Src) 98 F (36.7 C) (Rectal)  Resp 21  Ht 5\' 10"  (1.778 m)  Wt 72.576 kg  BMI 22.96 kg/m2  SpO2 98% Physical Exam  Constitutional: He appears well-developed and well-nourished. No distress.  HENT:  Head: Normocephalic and atraumatic.  Mouth/Throat: Oropharynx is clear and moist.  Eyes: Pupils are equal, round, and reactive to light.  Neck: Normal range of motion. Neck supple.  Cardiovascular: Normal rate, regular rhythm and normal heart sounds.  Exam  reveals no gallop and no friction rub.   No murmur heard. Pulmonary/Chest: Effort normal and breath sounds normal. No respiratory distress. He has no wheezes.  Abdominal: Soft. Bowel sounds are normal. He exhibits no distension. There is no tenderness.  Neurological: He is alert. He exhibits normal muscle tone. Coordination normal.  Skin: Skin is warm and dry. No rash noted. No erythema.  Psychiatric: He has a normal mood and affect. His behavior is normal.  Nursing note and vitals reviewed.   ED Course  Procedures (including critical care time) Labs Review Labs Reviewed  COMPREHENSIVE METABOLIC PANEL - Abnormal; Notable for the following:    Glucose, Bld  101 (*)    BUN 27 (*)    Total Protein 6.4 (*)    Albumin 3.3 (*)    GFR calc non Af Amer 59 (*)    All other components within normal limits  CBC WITH DIFFERENTIAL/PLATELET - Abnormal; Notable for the following:    RBC 3.92 (*)    Hemoglobin 11.4 (*)    HCT 35.1 (*)    All other components within normal limits  URINE CULTURE  URINALYSIS, ROUTINE W REFLEX MICROSCOPIC (NOT AT [facility])  I-STAT CG4 LACTIC ACID, ED    Imaging Review Ct Head Wo Contrast  07/12/2015  CLINICAL DATA:  Altered mental status. Last seen normal late last night. History of dementia and subdural hematoma. EXAM: CT HEAD WITHOUT CONTRAST TECHNIQUE: Contiguous axial images were obtained from the base of the skull through the vertex without intravenous contrast. COMPARISON:  04/28/2015 FINDINGS: The ventricles are normal configuration. There is ventricular and sulcal enlargement reflecting moderate atrophy. No convincing hydrocephalus. There are no parenchymal masses or mass effect. There is no evidence of a recent cortical infarct. White matter hypoattenuation is noted consistent moderate chronic microvascular ischemic change. There are no extra-axial masses or abnormal fluid collections. There is no intracranial hemorrhage. There are changes from left posterior frontal and parietal burr holes. Several left mastoid air cells are opacified with fluid attenuation. Clear right mastoid air cells. Clear visualized sinuses. IMPRESSION: 1. No acute intracranial abnormalities. 2. Moderate atrophy and chronic microvascular ischemic change. Electronically Signed   By: Amie Portland M.D.   On: 07/12/2015 15:17   Dg Chest Port 1 View  07/12/2015  CLINICAL DATA:  Cough. EXAM: PORTABLE CHEST 1 VIEW COMPARISON:  04/28/2015 FINDINGS: Cardiac silhouette is normal in size. No mediastinal or hilar masses or evidence of adenopathy. Lungs show mild prominence of the bronchovascular markings. Stable scarring or chronic subsegmental atelectasis at the  right lung base. No convincing pneumonia. No pulmonary edema. No obvious pleural effusion and no pneumothorax. Left anterior chest wall pacemaker is stable. IMPRESSION: No acute cardiopulmonary disease. Electronically Signed   By: Amie Portland M.D.   On: 07/12/2015 13:54   I have personally reviewed and evaluated these images and lab results as part of my medical decision-making.   EKG Interpretation   Date/Time:  Tuesday Jul 12 2015 12:58:54 EDT Ventricular Rate:  60 PR Interval:    QRS Duration: 93 QT Interval:  427 QTC Calculation: 427 R Axis:   90 Text Interpretation:  artifact makes rhythm interpretation difficult  Borderline right axis deviation Borderline repolarization abnormality  Minimal ST elevation, lateral leads Artifact in lead(s) I II III aVR aVL  aVF V1 V5 V6 Confirmed by Rubin Payor  MD, NATHAN [id number]) on 07/12/2015  1:08:18 PM      MDM   Final diagnoses:  None    Patient's been  stable here in the emergency department and appears to be at his baseline.  I did advise the patient will need to follow up with his neurologist about his Keppra dosage, as this could have represented a seizure type activity    Charlestine Night, PA-C 07/13/15 1610  Benjiman Core, MD 07/13/15 [phone]

## 2015-07-12 NOTE — ED Notes (Signed)
Report called to [facility].

## 2015-07-14 LAB — URINE CULTURE: Culture: NO GROWTH

## 2015-08-11 ENCOUNTER — Encounter: Payer: Medicare Other | Admitting: *Deleted

## 2015-08-11 ENCOUNTER — Telehealth: Payer: Self-pay | Admitting: Cardiology

## 2015-08-11 NOTE — Telephone Encounter (Signed)
LMOVM reminding pt to send remote transmission.   

## 2015-08-12 ENCOUNTER — Encounter: Payer: Self-pay | Admitting: Cardiology

## 2015-09-19 ENCOUNTER — Ambulatory Visit: Payer: Medicare Other | Admitting: Neurology

## 2015-10-18 ENCOUNTER — Encounter (HOSPITAL_COMMUNITY): Payer: Self-pay | Admitting: *Deleted

## 2015-10-18 ENCOUNTER — Emergency Department (HOSPITAL_COMMUNITY)

## 2015-10-18 ENCOUNTER — Emergency Department (HOSPITAL_COMMUNITY)
Admission: EM | Admit: 2015-10-18 | Discharge: 2015-10-18 | Disposition: A | Discharge status: Home / Self Care | Attending: Emergency Medicine | Admitting: Emergency Medicine

## 2015-10-18 DIAGNOSIS — S0990XA Unspecified injury of head, initial encounter: Secondary | ICD-10-CM | POA: Diagnosis present

## 2015-10-18 DIAGNOSIS — Y939 Activity, unspecified: Secondary | ICD-10-CM | POA: Insufficient documentation

## 2015-10-18 DIAGNOSIS — W050XXA Fall from non-moving wheelchair, initial encounter: Secondary | ICD-10-CM | POA: Insufficient documentation

## 2015-10-18 DIAGNOSIS — G309 Alzheimer's disease, unspecified: Secondary | ICD-10-CM | POA: Insufficient documentation

## 2015-10-18 DIAGNOSIS — Z79899 Other long term (current) drug therapy: Secondary | ICD-10-CM | POA: Diagnosis not present

## 2015-10-18 DIAGNOSIS — W19XXXA Unspecified fall, initial encounter: Secondary | ICD-10-CM

## 2015-10-18 DIAGNOSIS — Z950 Presence of cardiac pacemaker: Secondary | ICD-10-CM | POA: Insufficient documentation

## 2015-10-18 DIAGNOSIS — Z87891 Personal history of nicotine dependence: Secondary | ICD-10-CM | POA: Insufficient documentation

## 2015-10-18 DIAGNOSIS — S0083XA Contusion of other part of head, initial encounter: Secondary | ICD-10-CM

## 2015-10-18 DIAGNOSIS — S60511A Abrasion of right hand, initial encounter: Secondary | ICD-10-CM | POA: Diagnosis not present

## 2015-10-18 DIAGNOSIS — Y999 Unspecified external cause status: Secondary | ICD-10-CM | POA: Insufficient documentation

## 2015-10-18 DIAGNOSIS — Y9289 Other specified places as the place of occurrence of the external cause: Secondary | ICD-10-CM | POA: Diagnosis not present

## 2015-10-18 DIAGNOSIS — T148XXA Other injury of unspecified body region, initial encounter: Secondary | ICD-10-CM

## 2015-10-18 NOTE — Discharge Instructions (Signed)
Mr Charles Lambert had a CT Scan of his head and neck which are unchanged from past scans

## 2015-10-18 NOTE — ED Notes (Signed)
ABRASIONS TO THE FOREHEAD, RIGHT HAND, AND LEFT KNEE CLEANSED WITH NORMAL SALINE.

## 2015-10-18 NOTE — ED Notes (Signed)
Bed: WA03 Expected date:  Expected time:  Means of arrival:  Comments: 82yo M fall/head lac/c-collar

## 2015-10-18 NOTE — ED Notes (Signed)
PT DISCHARGED. INSTRUCTIONS GIVEN TO PTAR STAFF. PT IN NO APPARENT DISTRESS OR PAIN. THE OPPORTUNITY TO ASK QUESTIONS WAS PROVIDED.

## 2015-10-18 NOTE — ED Provider Notes (Signed)
[facility]-EMERGENCY DEPT Provider Note   CSN: [id number] Arrival date & time: 10/18/15  1945  First Provider Contact:  First MD Initiated Contact with Patient 10/18/15 2015       By signing my name below, I, Rosario Adie, attest that this documentation has been prepared under the direction and in the presence of Earley Favor, FNP.  Electronically Signed: Rosario Adie, ED Scribe. 10/18/15. 8:18 PM.  History   Chief Complaint Chief Complaint  Patient presents with  . Fall   The history is provided by a relative. The history is limited by the condition of the patient. No language interpreter was used.   LEVEL 5 CAVEAT: HPI and ROS limited due to nonverbal and dementia   HPI Comments: Charles Lambert is a 82 y.o. male BIB EMS from [facility] with a PMHx significant of dementia, who presents to the Emergency Department s/p witnessed fall from his wheelchair that occurred PTA. Pt has a hx of alzheimer's and dementia at baseline, and son notes at bedside that the pt is mostly nonverbal. En route, EMS placed a c-collar and noted injuries sustained to the right hand and right forehead.  Past Medical History:  Diagnosis Date  . Dementia   . Pacemaker   . Pneumonia   . Prostate enlargement   . Sinoatrial node dysfunction (HCC)   . Subdural hematoma [facility])     Patient Active Problem List   Diagnosis Date Noted  . Lewy body dementia 05/19/2015  . OSA (obstructive sleep apnea) 07/13/2013  . Dementia with Lewy bodies 03/24/2013  . Weakness generalized 01/18/2013  . 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 GERD (gastroesophageal reflux disease) 01/18/2013  . BPH (benign prostatic hyperplasia) 01/18/2013  . Seizures (HCC) 01/08/2013  . Encephalopathy acute 01/07/2013  . Uncontrolled daytime somnolence 12/26/2012  . Altered mental status 12/25/2012  . Hypokalemia 12/25/2012  . Bacteremia 12/24/2012  . Fever 12/22/2012  . Anemia 12/22/2012  . Acute renal failure (HCC)  12/22/2012  . Dementia 12/22/2012  . Pacemaker-St.Jude 01/10/2012  . Atrial fibrillation (HCC) 01/10/2012  . Sinoatrial node dysfunction (HCC)    Past Surgical History:  Procedure Laterality Date  . BURR HOLE    . KNEE ARTHROSCOPY Left     Home Medications    Prior to Admission medications   Medication Sig Start Date End Date Taking? Authorizing Provider  carbidopa-levodopa (SINEMET IR) 25-100 MG tablet Take 2 tablets by mouth 4 (four) times daily. 01/26/15  Yes Donika K Patel, DO  Cranberry 425 MG CAPS Take 425 mg by mouth 2 (two) times daily.   Yes Historical Provider, MD  ENSURE (ENSURE) Take 237 mLs by mouth 3 (three) times daily between meals. *Vanilla Flavor*   Yes Historical Provider, MD  fludrocortisone (FLORINEF) 0.1 MG tablet Take 1 tablet (0.1 mg total) by mouth daily. Patient taking differently: Take 0.1 mg by mouth 2 (two) times daily.  01/26/15  Yes Donika K Patel, DO  levETIRAcetam (KEPPRA) 500 MG tablet Take 1 tablet (500 mg total) by mouth 2 (two) times daily. 07/12/15  Yes Rolland Porter, MD  LORazepam (ATIVAN) 0.5 MG tablet Take 0.25 mg by mouth every 4 (four) hours as needed for anxiety.   Yes Historical Provider, MD  protein supplement (RESOURCE BENEPROTEIN) POWD Take 1 scoop by mouth 3 (three) times daily with meals. 04/27/15  Yes Historical Provider, MD  sertraline (ZOLOFT) 25 MG tablet Take 25 mg by mouth daily.   Yes Historical Provider, MD  atropine 1 %  ophthalmic solution Place 4 drops under the tongue every 4 (four) hours as needed (tracheal secretions).    Historical Provider, MD  loratadine (CLARITIN) 10 MG tablet Take 10 mg by mouth daily as needed for allergies.     Historical Provider, MD  vitamin B-12 (CYANOCOBALAMIN) 1000 MCG tablet Take 1,000 mcg by mouth daily.    Historical Provider, MD    Family History Family History  Problem Relation Age of Onset  . Hypertension Mother   . Hypertension Father   . Hyperlipidemia Son     Social History Social  History  Substance Use Topics  . Smoking status: Former Smoker    Types: Cigarettes    Quit date: 03/12/1968  . Smokeless tobacco: Not on file     Comment: Quit in 1970s  . Alcohol use No     Comment: Occasional beer    Allergies   Ativan [lorazepam]  Review of Systems Review of Systems  Unable to perform ROS: Dementia  Skin: Positive for wound.  All other systems reviewed and are negative.  LEVEL 5 CAVEAT: HPI and ROS limited due to nonverbal and dementia  Physical Exam Updated Vital Signs BP (!) 154/115 (BP Location: Left Arm)   Pulse 104   Temp 97.5 F (36.4 C) (Axillary)   Resp 14   SpO2 90%   Physical Exam  Constitutional: He appears well-developed and well-nourished. No distress. Cervical collar in place.  Pt does not follow commands well on exam.   HENT:  Head: Normocephalic.    Eyes: Conjunctivae are normal.  Neck:  C-collar in place.  Cardiovascular: Normal rate, regular rhythm and normal heart sounds.   No murmur heard. Pulmonary/Chest: Effort normal and breath sounds normal. No respiratory distress. He has no wheezes.  Abdominal: Soft. Bowel sounds are normal. He exhibits no distension.  Musculoskeletal:  Abrasion noted to the right hand inbetween the second and third MC joints. Hematoma w/ superficial abrasion to the right forehead. No pain w/ pelvic rock. ROM of all four extremities intact.  Neurological: He is alert.  Opens eyes to name but does not follow commands  Skin: Skin is warm and dry.  Nursing note and vitals reviewed.  ED Treatments / Results  DIAGNOSTIC STUDIES: Oxygen Saturation is 90% on RA, low by my interpretation.   Labs (all labs ordered are listed, but only abnormal results are displayed) Labs Reviewed - No data to display  EKG  EKG Interpretation None       Radiology Ct Head Wo Contrast  Result Date: 10/18/2015 CLINICAL DATA:  Witnessed fall from wheelchair. Laceration hematoma forehead. EXAM: CT HEAD WITHOUT  CONTRAST CT CERVICAL SPINE WITHOUT CONTRAST TECHNIQUE: Multidetector CT imaging of the head and cervical spine was performed following the standard protocol without intravenous contrast. Multiplanar CT image reconstructions of the cervical spine were also generated. COMPARISON:  07/12/2015 head CT. Head and cervical spine CT from 04/28/2015. FINDINGS: CT HEAD FINDINGS There is no evidence for acute hemorrhage, hydrocephalus, mass lesion, or abnormal extra-axial fluid collection. No definite CT evidence for acute infarction. Diffuse loss of parenchymal volume is consistent with atrophy. Patchy low attenuation in the deep hemispheric and periventricular white matter is nonspecific, but likely reflects chronic microvascular ischemic demyelination. The visualized paranasal sinuses and right mastoid air cells are clear. Left mastoid air cells are opacified, as before. Burr holes are again noted in the left skull. Right frontal soft tissue swelling is evident. CT CERVICAL SPINE FINDINGS Imaging was obtained from the skullbase through  the T2 vertebral body. No evidence for an acute fracture. There is diffuse degenerative disc and endplate disease in the cervical spine. Upper and mid cervical facet osteoarthritis is evident in the left C2-3 facets are fused. Trace anterolisthesis of C7 on T1 is compatible with the loss of facet space at this level. Straightening of the normal cervical lordosis is evident. No prevertebral soft tissue edema. IMPRESSION: 1. Stable head CT without new or acute interval findings. 2. Atrophy with chronic small vessel white matter ischemic disease. 3. No evidence for cervical spine fracture with diffuse degenerative changes. Atrophy with chronic small vessel white matter ischemic Electronically Signed   By: Kennith Center M.D.   On: 10/18/2015 20:57   Ct Cervical Spine Wo Contrast  Result Date: 10/18/2015 CLINICAL DATA:  Witnessed fall from wheelchair. Laceration hematoma forehead. EXAM: CT HEAD  WITHOUT CONTRAST CT CERVICAL SPINE WITHOUT CONTRAST TECHNIQUE: Multidetector CT imaging of the head and cervical spine was performed following the standard protocol without intravenous contrast. Multiplanar CT image reconstructions of the cervical spine were also generated. COMPARISON:  07/12/2015 head CT. Head and cervical spine CT from 04/28/2015. FINDINGS: CT HEAD FINDINGS There is no evidence for acute hemorrhage, hydrocephalus, mass lesion, or abnormal extra-axial fluid collection. No definite CT evidence for acute infarction. Diffuse loss of parenchymal volume is consistent with atrophy. Patchy low attenuation in the deep hemispheric and periventricular white matter is nonspecific, but likely reflects chronic microvascular ischemic demyelination. The visualized paranasal sinuses and right mastoid air cells are clear. Left mastoid air cells are opacified, as before. Burr holes are again noted in the left skull. Right frontal soft tissue swelling is evident. CT CERVICAL SPINE FINDINGS Imaging was obtained from the skullbase through the T2 vertebral body. No evidence for an acute fracture. There is diffuse degenerative disc and endplate disease in the cervical spine. Upper and mid cervical facet osteoarthritis is evident in the left C2-3 facets are fused. Trace anterolisthesis of C7 on T1 is compatible with the loss of facet space at this level. Straightening of the normal cervical lordosis is evident. No prevertebral soft tissue edema. IMPRESSION: 1. Stable head CT without new or acute interval findings. 2. Atrophy with chronic small vessel white matter ischemic disease. 3. No evidence for cervical spine fracture with diffuse degenerative changes. Atrophy with chronic small vessel white matter ischemic Electronically Signed   By: Kennith Center M.D.   On: 10/18/2015 20:57    Procedures Procedures (including critical care time)  Medications Ordered in ED Medications - No data to display   Initial  Impression / Assessment and Plan / ED Course  I have reviewed the triage vital signs and the nursing notes.  Pertinent labs & imaging results that were available during my care of the patient were reviewed by me and considered in my medical decision making (see chart for details).  Clinical Course   Son at bedside who does not note any change in patients behavior Will have wounds cleaned Obtain CT of head and neck  CT scans reviewed no fractures  Wound care preformed    Final Clinical Impressions(s) / ED Diagnoses   Final diagnoses:  Fall, initial encounter  Facial contusion, initial encounter  Abrasion    New Prescriptions New Prescriptions   No medications on file   I personally performed the services described in this documentation, which was scribed in my presence. The recorded information has been reviewed and is accurate.    Earley Favor, NP 10/18/15 2116  Lorre NickAnthony Allen, MD 10/18/15 2121

## 2015-10-18 NOTE — ED Triage Notes (Addendum)
Pt brought [facility] by EMS, [facility] with a witnessed fall from wheelchair. Pt has a laceration/hematoma to forehead. C-collar placed by EMS as precaution. Pt has a hx of alzheimer's dz/dementia at baseline. Skin tear to right hand.

## 2015-11-11 DEATH — deceased

## 2016-04-12 DEATH — deceased

## 2017-03-07 IMAGING — CT CT CERVICAL SPINE W/O CM
3 of 4 series · 12 of 27 positions shown, 15 images · non-contrast
Comparison: Head and C-spine CT - 07/06/2014; head CT - 04/20/2013

CLINICAL DATA: Unwitnessed fall now with laceration to the right
forehead.

EXAM:
CT HEAD WITHOUT CONTRAST
CT CERVICAL SPINE WITHOUT CONTRAST
TECHNIQUE: Multidetector CT imaging of the head and cervical spine was
performed following the standard protocol without intravenous
contrast. Multiplanar CT image reconstructions of the cervical spine
were also generated.

[Series 4: bone windows · axial · 0.43mm/px · z∈[-118,-67]mm · 2 of 51 slices shown]
[im 17/51  bone]
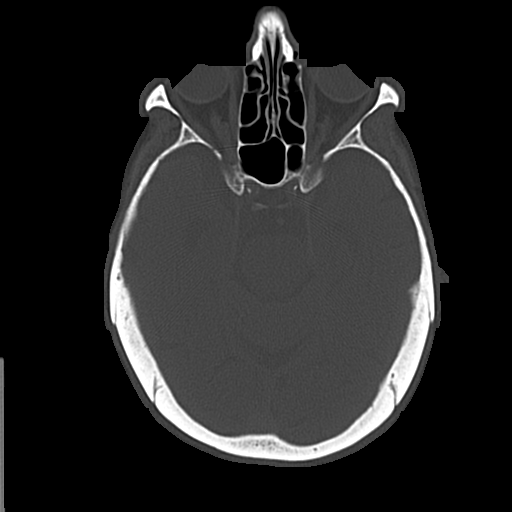
[im 34/51  bone]
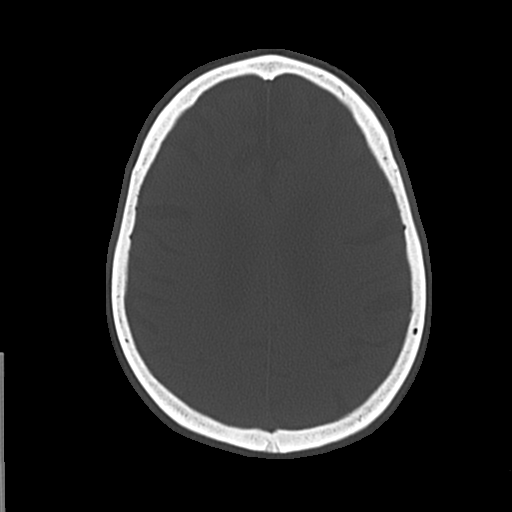

[Series 5: c-spine st · axial · 0.28mm/px · z∈[-286,-182]mm · 5 of 78 slices shown, 7 images]
[im 13/78  soft-tissue]
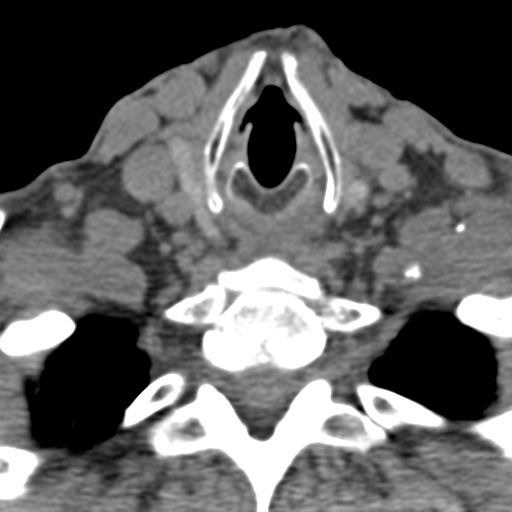
[im 13/78  bone]
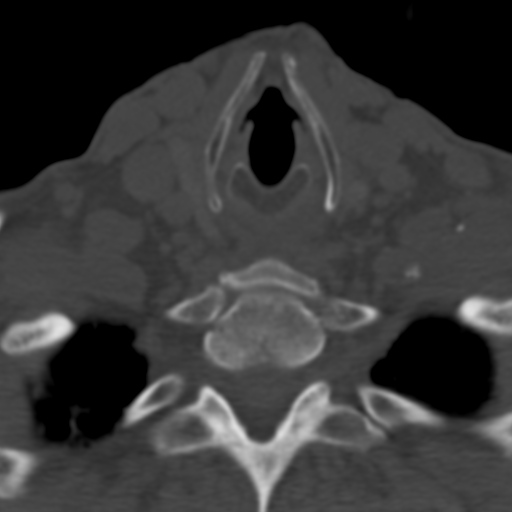
[im 26/78  bone]
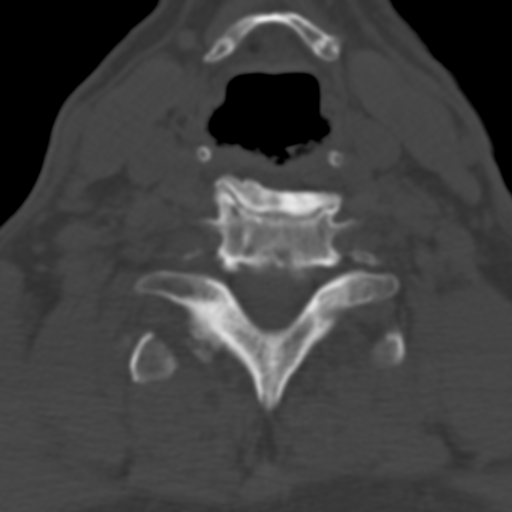
[im 39/78  bone]
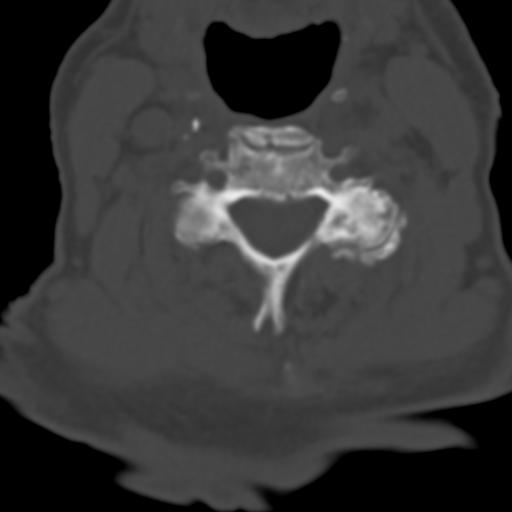
[im 52/78  bone]
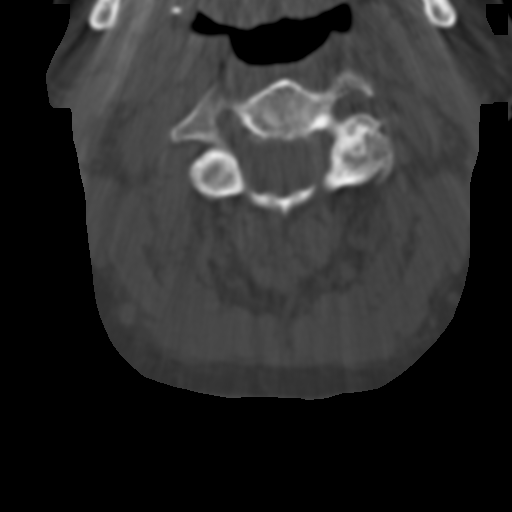
[im 65/78  soft-tissue]
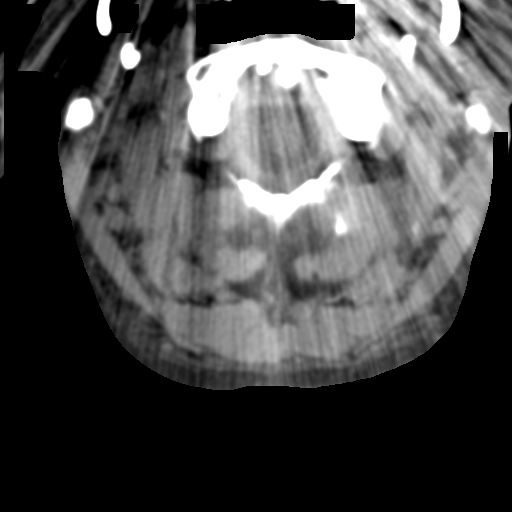
[im 65/78  bone]
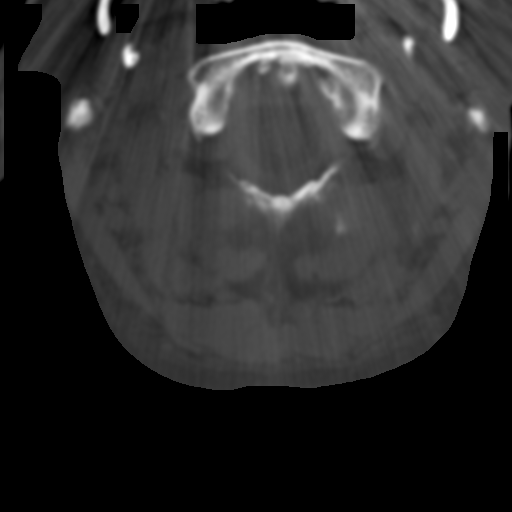

[Series 10: sagittal · sagittal · 0.29mm/px · 5 of 36 slices shown, 6 images]
[im 12/36  bone]
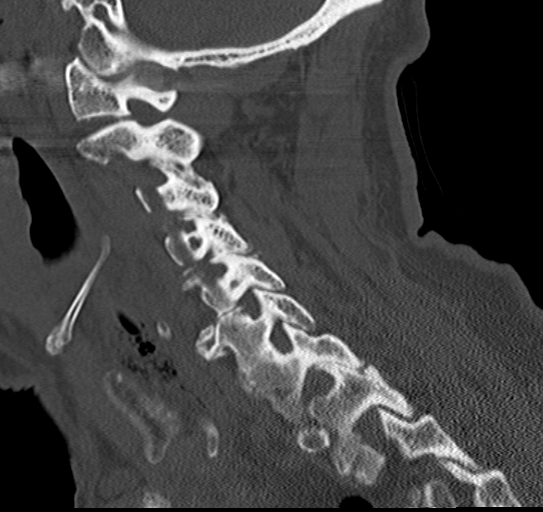
[im 15/36  bone]
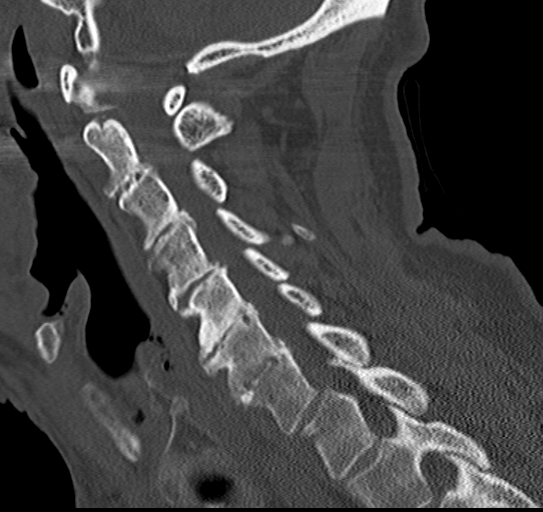
[im 18/36  soft-tissue]
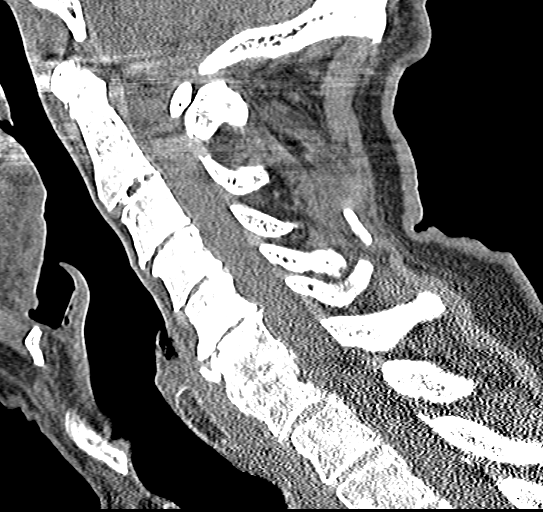
[im 18/36  bone]
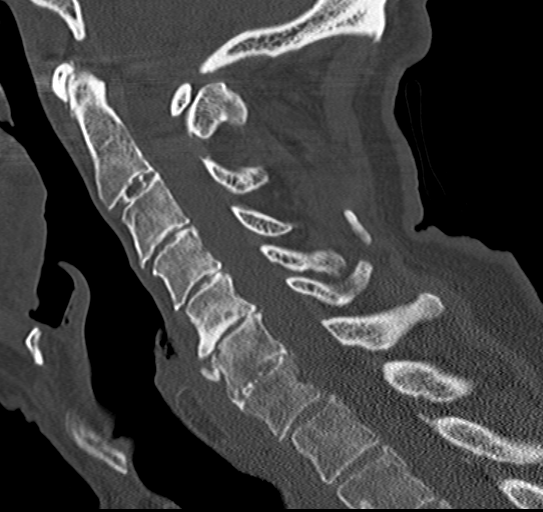
[im 21/36  bone]
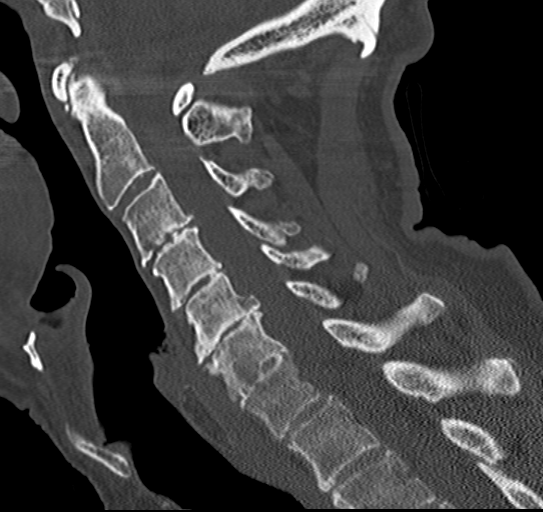
[im 24/36  bone]
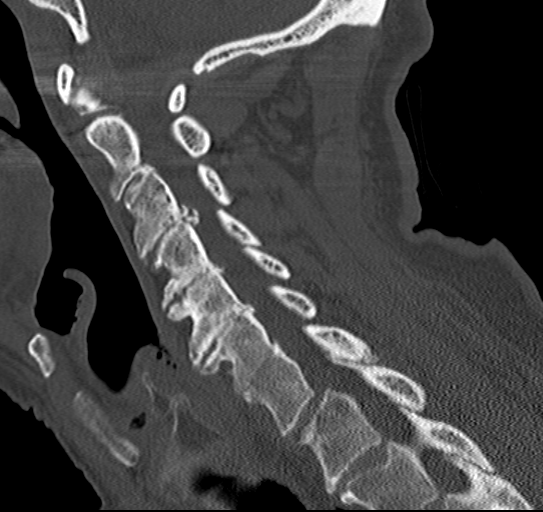

[12 of 27 positions shown; findings below may reference images not displayed]

FINDINGS: CT HEAD FINDINGS

There is a very minimal amount of subcutaneous stranding about the
superior lateral aspect of the right side of the forehead (image 21,
series 2). This finding is without associated radiopaque foreign
body or displaced calvarial fracture.

Burr holes are again seen within the left frontal and parietal
lobes.

Stable sequela of advanced atrophy with sulcal prominence,
prominence of the bifrontal extra-axial spaces and centralized
volume loss with mild commensurate ex vacuo dilatation of the
ventricular system. Scattered periventricular hypodensities
compatible microvascular ischemic disease. No CT evidence of acute
large territory infarct. No intraparenchymal extra-axial mass or
hemorrhage. Normal size and configuration of the ventricles and
basilar cisterns. No midline shift. Minimal intracranial
atherosclerosis. Limited visualization of the paranasal sinuses and
mastoid air cells is normal. Debris is seen within the bilateral
external auditory canals.

CT CERVICAL SPINE FINDINGS

C1 to the superior endplate of T2 is imaged.

Normal alignment of the cervical spine. No anterolisthesis or
retrolisthesis. The dens is normally positioned between the lateral
masses of C1. Normal atlantodental and atlantoaxial articulations.
There is a tiny well corticated ossicle about the right-sided the
tip of the dens (representative sagittal image 16, series 10,
coronal image 17, series 9).

There is complete opacification of the left C2-C3 transverse facets
and partial ossification of the right C2-C3 transverse facets and
likely partial ossification of the C2-C3 and the C6-C7
intervertebral disc spaces.

No fracture or static subluxation of the cervical spine. Cervical
vertebral body heights are preserved. Regional soft tissues are
normal.

Mild multilevel cervical spine DDD, likely worse at C5 - C6 and to a
lesser extent, C3-C4 and C4-C5 with disc space height loss, endplate
irregularity and small posteriorly directed disc osteophyte
complexes at these locations.

There is partial ossification of the nuchal ligament posterior to
the C4 and C5 spinous processes.

Scattered atherosclerotic plaque within the bilateral carotid bulbs.
There is mild diffuse heterogeneity of the thyroid gland without
discrete nodule on this noncontrast examination. No definitive bulky
cervical lymphadenopathy on this noncontrast examination.

Limited visualization of lung apices is normal.
IMPRESSION: Head CT Impression:

1. Minimal amount of subcutaneous stranding about the right side of
the forehead without associated radiopaque foreign body, displaced
calvarial fracture or acute intracranial process.
2. Similar findings of advanced atrophy and microvascular ischemic
disease.
Cervical spine CT Impression:

1. No fracture or static subluxation of cervical spine.
2. Mild multilevel cervical spine DDD, likely worse at C5-C6.
